# Patient Record
Sex: Female | Born: 1955
Health system: Southern US, Community
[De-identification: ages and names within clinical notes are randomized; demographics above are authoritative.]

## PROBLEM LIST (undated history)

## (undated) DIAGNOSIS — M797 Fibromyalgia: Secondary | ICD-10-CM

## (undated) DIAGNOSIS — E785 Hyperlipidemia, unspecified: Secondary | ICD-10-CM

## (undated) DIAGNOSIS — E119 Type 2 diabetes mellitus without complications: Secondary | ICD-10-CM

## (undated) DIAGNOSIS — R238 Other skin changes: Secondary | ICD-10-CM

## (undated) DIAGNOSIS — I639 Cerebral infarction, unspecified: Secondary | ICD-10-CM

## (undated) DIAGNOSIS — K5792 Diverticulitis of intestine, part unspecified, without perforation or abscess without bleeding: Secondary | ICD-10-CM

## (undated) DIAGNOSIS — I10 Essential (primary) hypertension: Secondary | ICD-10-CM

## (undated) HISTORY — DX: Cerebral infarction, unspecified: I63.9

## (undated) HISTORY — PX: CHOLECYSTECTOMY: SHX55

## (undated) HISTORY — DX: Type 2 diabetes mellitus without complications: E11.9

## (undated) HISTORY — DX: Hyperlipidemia, unspecified: E78.5

## (undated) HISTORY — DX: Fibromyalgia: M79.7

## (undated) HISTORY — DX: Essential (primary) hypertension: I10

## (undated) HISTORY — DX: Other skin changes: R23.8

## (undated) HISTORY — PX: KNEE SURGERY: SHX244

## (undated) NOTE — *Deleted (*Deleted)
History and Physical    [name] WUJ:[id number] DOB: 12-Jul-1955 DOA: 03/01/2020  PCP: Benita Stabile, MD (Confirm with patient/family/NH records and if not entered, this has to be entered at [facility] point of entry) Patient coming from: ***  I have personally briefly reviewed patient's old medical records in [facility]  Chief Complaint: ***  HPI: [name] is a 44 y.o. female with medical history significant of *** (For level 3, the HPI must include 4+ descriptors: Location, Quality, Severity, Duration, Timing, Context, modifying factors, associated signs/symptoms and/or status of 3+ chronic problems.)  (Please avoid self-populating past medical history here) (The initial 2-3 lines should be focused and good to copy and paste in the HPI section of the daily progress note).  ED Course: ***  Review of Systems: As per HPI otherwise all other systems reviewed and are negative. Unacceptable ROS statements: "10 systems reviewed," "Extensive" (without elaboration).  Acceptable ROS statements: "All others negative," "All others reviewed and are negative," and "All others unremarkable," with at LEAST ONE ROS documented Can't double dip - if using for HPI can't use for ROS  Past Medical History:  Diagnosis Date  . Diabetes mellitus without complication (HCC)   . Diverticulitis   . Fibromyalgia   . Hyperlipidemia   . Hypertension   . Papule 03/22/2015  . Stroke [facility])     Past Surgical History:  Procedure Laterality Date  . CHOLECYSTECTOMY    . KNEE SURGERY Left     Social History  reports that she quit smoking about 18 years ago. Her smoking use included cigarettes. She has a 57.00 pack-year smoking history. She has never used smokeless tobacco. She reports current alcohol use of about 1.0 standard drink of alcohol per week. She reports that she does not use drugs.  Allergies  Allergen Reactions  . Bee Pollen Anaphylaxis and Swelling  . Bee Venom   . Codeine      REACTION: nauseated,claustrophobic    Family History  Problem Relation Age of Onset  . Diabetes Mother   . Dementia Mother   . Hypertension Mother   . Stroke Mother   . Alcohol abuse Father   . Cirrhosis Father   . Arthritis/Rheumatoid Sister   . Diabetes Brother   . Hypertension Brother   . Diabetes Maternal Grandmother   . Fibromyalgia Sister   . Hypertension Sister   . Fibromyalgia Sister   . Hypertension Sister   . Diabetes Sister   . Hypertension Sister   . Cirrhosis Brother   . Pancreatic cancer Brother    *** Unacceptable: Noncontributory, unremarkable, or negative. Acceptable: (example)Family history negative for heart disease  Prior to Admission medications   Medication Sig Start Date End Date Taking? Authorizing Provider  amLODipine (NORVASC) 10 MG tablet Take 10 mg by mouth at bedtime. 03/01/18  Yes [provider]  aspirin 325 MG tablet Take 1 tablet (325 mg total) by mouth daily. 11/28/17  Yes Vassie Loll, MD  atorvastatin (LIPITOR) 40 MG tablet Take 40 mg by mouth daily. 05/05/18  Yes [provider]  carvedilol (COREG) 6.25 MG tablet Take 6.25 mg by mouth 2 (two) times daily. 01/12/20  Yes [provider]  cloNIDine (CATAPRES) 0.1 MG tablet Take 0.1 mg by mouth 2 (two) times daily. 04/25/18  Yes [provider]  diclofenac sodium (VOLTAREN) 1 % GEL Apply 3 g to 3 large joints up to 3 times a day when necessary 02/27/18  Yes Gearldine Bienenstock, PA-C  fenofibrate 160 MG tablet Take 160 mg by mouth daily.  11/06/14  Yes [provider]  hydrALAZINE (APRESOLINE) 10 MG tablet Take 10 mg by mouth 2 (two) times daily. 04/05/18  Yes [provider]  ketoconazole (NIZORAL) 2 % cream Apply 1 application topically 2 (two) times daily. 02/05/20  Yes [provider]  lidocaine (LIDODERM) 5 % Place 1 patch onto the skin daily as needed. Apply patch to area most significant pain once per day.  Remove and discard patch within  12 hours of application. 11/14/19  Yes Petrucelli, Samantha R, PA-C  metFORMIN (GLUCOPHAGE) 1000 MG tablet Take 1 tablet (1,000 mg total) by mouth 2 (two) times daily with a meal. 11/27/17 03/01/20 Yes Vassie Loll, MD  olmesartan (BENICAR) 40 MG tablet Take 40 mg by mouth daily. 04/27/18  Yes [provider]  omega-3 acid ethyl esters (LOVAZA) 1 g capsule Take 1 capsule (1 g total) by mouth 2 (two) times daily. 11/27/17  Yes Vassie Loll, MD  simvastatin (ZOCOR) 40 MG tablet Take 40 mg by mouth daily at 6 PM.  01/30/18  Yes [provider]  SOLIQUA 100-33 UNT-MCG/ML SOPN Inject 16 Units into the skin daily before breakfast. (Use less than 1 hour before first meal of the day) 03/22/18  Yes [provider]  albuterol (PROVENTIL HFA;VENTOLIN HFA) 108 (90 Base) MCG/ACT inhaler Inhale 2 puffs into the lungs every 6 (six) hours as needed.  04/18/18   [provider]  baclofen 5 MG TABS Take 5 mg by mouth 2 (two) times daily. Start 1 tablet twice daily x 1 week then three times daily Patient not taking: Reported on 03/01/2020 05/27/18   Micki Riley, MD  Blood Glucose Monitoring Suppl (ONETOUCH VERIO) w/Device KIT USE TO CHECK GLUCOSE THREE TIMES DAILY 11/22/17   [provider]  hydrochlorothiazide (HYDRODIURIL) 12.5 MG tablet Take 12.5 mg by mouth daily. 02/16/20   [provider]  insulin detemir (LEVEMIR) 100 UNIT/ML injection Inject 0.3 mLs (30 Units total) into the skin daily. Patient not taking: Reported on 03/01/2020 11/28/17   Vassie Loll, MD  losartan (COZAAR) 100 MG tablet Take 100 mg by mouth daily. 01/16/18   [provider]  ONETOUCH VERIO test strip USE 1 STRIP TO CHECK GLUCOSE THREE TIMES DAILY 11/23/17   [provider]    Physical Exam: Vitals:   03/01/20 2000 03/01/20 2119 03/01/20 2130 03/01/20 2300  BP: (!) 177/73 (!) 163/85 (!) 164/92 (!) 160/72  Pulse: (!) 102 (!) 103 (!) 102 100  Resp: 20 19 20 18   Temp:       TempSrc:      SpO2: 92% 92% 93% 95%  Weight:      Height:        Constitutional: NAD, calm, comfortable Vitals:   03/01/20 2000 03/01/20 2119 03/01/20 2130 03/01/20 2300  BP: (!) 177/73 (!) 163/85 (!) 164/92 (!) 160/72  Pulse: (!) 102 (!) 103 (!) 102 100  Resp: 20 19 20 18   Temp:      TempSrc:      SpO2: 92% 92% 93% 95%  Weight:      Height:       Eyes: PERRL, lids and conjunctivae normal ENMT: Mucous membranes are moist. Posterior pharynx clear of any exudate or lesions.Normal dentition.  Neck: normal, supple, no masses, no thyromegaly Respiratory: clear to auscultation bilaterally, no wheezing, no crackles. Normal respiratory effort. No accessory muscle use.  Cardiovascular: Regular rate and rhythm, no murmurs /  rubs / gallops. No extremity edema. 2+ pedal pulses. No carotid bruits.  Abdomen: no tenderness, no masses palpated. No hepatosplenomegaly. Bowel sounds positive.  Musculoskeletal: no clubbing / cyanosis. No joint deformity upper and lower extremities. Good ROM, no contractures. Normal muscle tone.  Skin: no rashes, lesions, ulcers. No induration Neurologic: CN 2-12 grossly intact. Sensation intact, DTR normal. Strength 5/5 in all 4.  Psychiatric: Normal judgment and insight. Alert and oriented x 3. Normal mood.   (Anything < 9 systems with 2 bullets each down codes to level 1) (If patient refuses exam can't bill higher level) (Make sure to document decubitus ulcers present on admission -- if possible -- and whether patient has chronic indwelling catheter at time of admission)  Labs on Admission: I have personally reviewed following labs and imaging studies  CBC: Recent Labs  Lab 03/01/20 1808  WBC 5.5  NEUTROABS 3.4  HGB 12.0  HCT 36.9  MCV 92.3  PLT 181    Basic Metabolic Panel: Recent Labs  Lab 03/01/20 1808  NA 130*  K 3.8  CL 97*  CO2 23  GLUCOSE 230*  BUN 36*  CREATININE 1.47*  CALCIUM 8.6*    GFR: Estimated Creatinine Clearance: 35  mL/min (A) (by C-G formula based on SCr of 1.47 mg/dL (H)).  Liver Function Tests: Recent Labs  Lab 03/01/20 1808  AST 36  ALT 29  ALKPHOS 57  BILITOT 0.3  PROT 7.0  ALBUMIN 3.2*    Urine analysis:    Component Value Date/Time   COLORURINE YELLOW 11/25/2017 0747   APPEARANCEUR CLEAR 11/25/2017 0747   LABSPEC 1.020 11/25/2017 0747   PHURINE 6.0 11/25/2017 0747   GLUCOSEU >=500 (A) 11/25/2017 0747   HGBUR NEGATIVE 11/25/2017 0747   BILIRUBINUR NEGATIVE 11/25/2017 0747   KETONESUR 5 (A) 11/25/2017 0747   PROTEINUR >=300 (A) 11/25/2017 0747   UROBILINOGEN 0.2 11/08/2014 1915   NITRITE NEGATIVE 11/25/2017 0747   LEUKOCYTESUR NEGATIVE 11/25/2017 0747    Radiological Exams on Admission: CT HEAD WO CONTRAST  Result Date: 03/01/2020 CLINICAL DATA:  Neuro deficit, acute stroke suspected. Left leg weakness at X 2 days. History of previous stroke with right-sided weakness. Per family, smile looks different. EXAM: CT HEAD WITHOUT CONTRAST TECHNIQUE: Contiguous axial images were obtained from the base of the skull through the vertex without intravenous contrast. COMPARISON:  MR head 11/26/2017. CT head 11/25/2017 FINDINGS: Brain: Patchy and confluent areas of decreased attenuation are noted throughout the deep and periventricular white matter of the cerebral hemispheres bilaterally, compatible with chronic microvascular ischemic disease. Suggestion of bilateral chronic basal ganglia lacunar infarctions. Slightly more conspicuous/possibly slightly larger in size right basal ganglia hypodensity. No evidence of large-territorial acute infarction. No parenchymal hemorrhage. No mass lesion. No extra-axial collection. No mass effect or midline shift. No hydrocephalus. Basilar cisterns are patent. Vascular: No hyperdense vessel. Atherosclerotic calcifications are present within the cavernous internal carotid arteries. Skull: No acute fracture or focal lesion. Sinuses/Orbits: Paranasal sinuses and  mastoid air cells are clear. The orbits are unremarkable. Other: None. IMPRESSION: 1. Microvascular ischemic changes as well as bilateral chronic basal ganglia lacunar infarcts with possibly slightly larger right basal ganglia hypodensity. Cannot exclude overlying acute/subacute infarction. Consider noncontrast MRI for further/more sensitive evaluation. 2. No acute intracranial hemorrhage. These results were called by telephone at the time of interpretation on 03/01/2020 at 6:41 pm to provider [facility] , who verbally acknowledged these results. Electronically Signed   By: Tish Frederickson M.D.   On: 03/01/2020 18:39  EKG: Independently reviewed.    Assessment/Plan Principal Problem:   Weakness of left lower extremity Active Problems:   Hypertension   Type 2 diabetes mellitus with hyperglycemia (HCC)   HLD (hyperlipidemia)   Depression   Hypothyroidism   Right spastic hemiplegia (HCC)   Azotemia   Hyponatremia   DVT prophylaxis: *** (Lovenox/Heparin/SCD's/anticoagulated/None (if comfort care) Code Status:   *** (Full/Partial (specify details) Family Communication:  *** (Specify name, relationship. Do not write "discussed with patient". Specify tel # if discussed over the phone) Disposition Plan:   Patient is from:  ***  Anticipated DC to:  ***  Anticipated DC date:  ***  Anticipated DC barriers: ***  Consults called:  *** (with names) Admission status:  *** (inpatient / obs / tele / medical floor / SDU)  Severity of Illness: {Observation/Inpatient:21159}    Bobette Mo MD [facility]  How to contact the [facility] Attending or Consulting provider 7A - 7P or covering provider during after hours 7P -7A, for this patient?   1. Check the care team in [facility] and look for a) attending/consulting [facility] provider listed and b) the [facility] team listed 2. Log into [email or web address] and use [facility]'s universal password to access. If you do not have the password, please contact the  hospital operator. 3. Locate the [facility] provider you are looking for under [facility] and page to a number that you can be directly reached. 4. If you still have difficulty reaching the provider, please page the [facility] (Director on Call) for the Hospitalists listed on amion for assistance.  03/01/2020, 11:41 PM

---

## 2001-01-06 ENCOUNTER — Emergency Department (HOSPITAL_COMMUNITY): Admission: EM | Admit: 2001-01-06 | Discharge: 2001-01-06 | Payer: Self-pay | Admitting: Emergency Medicine

## 2001-01-08 ENCOUNTER — Ambulatory Visit (HOSPITAL_COMMUNITY): Admission: RE | Admit: 2001-01-08 | Discharge: 2001-01-08 | Payer: Self-pay | Admitting: Family Medicine

## 2001-01-08 ENCOUNTER — Encounter: Payer: Self-pay | Admitting: Family Medicine

## 2001-05-10 ENCOUNTER — Ambulatory Visit (HOSPITAL_COMMUNITY): Admission: RE | Admit: 2001-05-10 | Discharge: 2001-05-10 | Payer: Self-pay | Admitting: Family Medicine

## 2001-05-10 ENCOUNTER — Encounter: Payer: Self-pay | Admitting: Family Medicine

## 2001-11-05 ENCOUNTER — Ambulatory Visit (HOSPITAL_COMMUNITY): Admission: RE | Admit: 2001-11-05 | Discharge: 2001-11-05 | Payer: Self-pay | Admitting: Obstetrics and Gynecology

## 2001-11-05 ENCOUNTER — Encounter: Payer: Self-pay | Admitting: Obstetrics and Gynecology

## 2001-11-20 ENCOUNTER — Ambulatory Visit (HOSPITAL_COMMUNITY): Admission: RE | Admit: 2001-11-20 | Discharge: 2001-11-20 | Payer: Self-pay | Admitting: Family Medicine

## 2001-11-20 ENCOUNTER — Encounter: Payer: Self-pay | Admitting: Family Medicine

## 2001-11-21 ENCOUNTER — Encounter: Payer: Self-pay | Admitting: Family Medicine

## 2001-11-21 ENCOUNTER — Inpatient Hospital Stay (HOSPITAL_COMMUNITY): Admission: AD | Admit: 2001-11-21 | Discharge: 2001-11-25 | Payer: Self-pay | Admitting: Family Medicine

## 2001-11-22 ENCOUNTER — Encounter: Payer: Self-pay | Admitting: Family Medicine

## 2001-11-23 ENCOUNTER — Encounter: Payer: Self-pay | Admitting: General Surgery

## 2001-12-11 ENCOUNTER — Ambulatory Visit (HOSPITAL_COMMUNITY): Admission: RE | Admit: 2001-12-11 | Discharge: 2001-12-11 | Payer: Self-pay | Admitting: Obstetrics and Gynecology

## 2001-12-11 ENCOUNTER — Encounter: Payer: Self-pay | Admitting: Obstetrics and Gynecology

## 2002-07-15 ENCOUNTER — Ambulatory Visit (HOSPITAL_COMMUNITY): Admission: RE | Admit: 2002-07-15 | Discharge: 2002-07-15 | Payer: Self-pay | Admitting: Obstetrics and Gynecology

## 2002-07-15 ENCOUNTER — Encounter: Payer: Self-pay | Admitting: Obstetrics and Gynecology

## 2003-12-07 ENCOUNTER — Other Ambulatory Visit: Admission: RE | Admit: 2003-12-07 | Discharge: 2003-12-07 | Payer: Self-pay | Admitting: Family Medicine

## 2004-08-09 ENCOUNTER — Ambulatory Visit (HOSPITAL_COMMUNITY): Admission: RE | Admit: 2004-08-09 | Discharge: 2004-08-09 | Payer: Self-pay | Admitting: Obstetrics and Gynecology

## 2004-12-21 ENCOUNTER — Encounter (HOSPITAL_COMMUNITY): Admission: RE | Admit: 2004-12-21 | Discharge: 2005-01-10 | Payer: Self-pay | Admitting: Podiatry

## 2005-06-14 ENCOUNTER — Ambulatory Visit (HOSPITAL_BASED_OUTPATIENT_CLINIC_OR_DEPARTMENT_OTHER): Admission: RE | Admit: 2005-06-14 | Discharge: 2005-06-14 | Payer: Self-pay | Admitting: Orthopedic Surgery

## 2005-08-14 ENCOUNTER — Ambulatory Visit (HOSPITAL_COMMUNITY): Admission: RE | Admit: 2005-08-14 | Discharge: 2005-08-14 | Payer: Self-pay | Admitting: Family Medicine

## 2006-05-14 ENCOUNTER — Ambulatory Visit (HOSPITAL_COMMUNITY): Admission: RE | Admit: 2006-05-14 | Discharge: 2006-05-14 | Payer: Self-pay | Admitting: Family Medicine

## 2007-06-11 ENCOUNTER — Emergency Department (HOSPITAL_COMMUNITY): Admission: EM | Admit: 2007-06-11 | Discharge: 2007-06-11 | Payer: Self-pay | Admitting: Emergency Medicine

## 2007-06-14 ENCOUNTER — Ambulatory Visit (HOSPITAL_COMMUNITY): Admission: RE | Admit: 2007-06-14 | Discharge: 2007-06-14 | Payer: Self-pay | Admitting: Internal Medicine

## 2007-08-15 ENCOUNTER — Other Ambulatory Visit: Admission: RE | Admit: 2007-08-15 | Discharge: 2007-08-15 | Payer: Self-pay | Admitting: Obstetrics and Gynecology

## 2007-08-19 ENCOUNTER — Ambulatory Visit (HOSPITAL_COMMUNITY): Admission: RE | Admit: 2007-08-19 | Discharge: 2007-08-19 | Payer: Self-pay | Admitting: Obstetrics & Gynecology

## 2007-09-14 ENCOUNTER — Emergency Department (HOSPITAL_COMMUNITY): Admission: EM | Admit: 2007-09-14 | Discharge: 2007-09-14 | Payer: Self-pay | Admitting: Emergency Medicine

## 2007-10-03 ENCOUNTER — Ambulatory Visit (HOSPITAL_COMMUNITY): Admission: RE | Admit: 2007-10-03 | Discharge: 2007-10-03 | Payer: Self-pay | Admitting: Family Medicine

## 2008-09-14 ENCOUNTER — Ambulatory Visit (HOSPITAL_COMMUNITY): Admission: RE | Admit: 2008-09-14 | Discharge: 2008-09-14 | Payer: Self-pay | Admitting: Family Medicine

## 2008-10-21 ENCOUNTER — Other Ambulatory Visit: Admission: RE | Admit: 2008-10-21 | Discharge: 2008-10-21 | Payer: Self-pay | Admitting: Obstetrics and Gynecology

## 2009-06-21 ENCOUNTER — Encounter (INDEPENDENT_AMBULATORY_CARE_PROVIDER_SITE_OTHER): Payer: Self-pay

## 2009-06-22 ENCOUNTER — Encounter (INDEPENDENT_AMBULATORY_CARE_PROVIDER_SITE_OTHER): Payer: Self-pay

## 2009-06-22 ENCOUNTER — Ambulatory Visit: Payer: Self-pay | Admitting: Internal Medicine

## 2009-07-06 ENCOUNTER — Ambulatory Visit: Payer: Self-pay | Admitting: Internal Medicine

## 2009-07-13 ENCOUNTER — Encounter: Payer: Self-pay | Admitting: Internal Medicine

## 2009-12-31 ENCOUNTER — Observation Stay (HOSPITAL_COMMUNITY): Admission: EM | Admit: 2009-12-31 | Discharge: 2010-01-01 | Payer: Self-pay | Admitting: Cardiology

## 2009-12-31 ENCOUNTER — Encounter: Payer: Self-pay | Admitting: Emergency Medicine

## 2010-06-02 NOTE — Letter (Signed)
Summary: Patient Notice- Polyp Results  [facility]  [address] [place], [place] [address]   Phone: [phone]  Fax: [phone]        July 09, 2009 MRN: [id number]    [facility] [address] [place], [place]  [address]    Dear Ms. Clingan,  I am pleased to inform you that the colon polyp(s) removed during your recent colonoscopy was (were) found to be benign (no cancer detected) upon pathologic examination. Your polyp was hyperplastic ( not precancerous)  I recommend you have a repeat colonoscopy examination in 10_ years to look for recurrent polyps, as having colon polyps increases your risk for having recurrent polyps or even colon cancer in the future.  Should you develop new or worsening symptoms of abdominal pain, bowel habit changes or bleeding from the rectum or bowels, please schedule an evaluation with either your primary care physician or with me.  Additional information/recommendations:  _x_ No further action with gastroenterology is needed at this time. Please      follow-up with your primary care physician for your other healthcare      needs.  __ Please call [phone] to schedule a return visit to review your      situation.  __ Please keep your follow-up visit as already scheduled.  __ Continue treatment plan as outlined the day of your exam.  Please call us if you are having persistent problems or have questions about your condition that have not been fully answered at this time.  Sincerely,  Hart Carwin MD  This letter has been electronically signed by your physician.  Appended Document: Patient Notice- Polyp Results Letter mailed 3.15.11.

## 2010-06-02 NOTE — Miscellaneous (Signed)
Summary: Lec previsit  Clinical Lists Changes  Medications: Added new medication of DULCOLAX 5 MG  TBEC (BISACODYL) Day before procedure take 2 at 3pm and 2 at 8pm. - Signed Added new medication of REGLAN 10 MG  TABS (METOCLOPRAMIDE HCL) As per prep instructions. - Signed Added new medication of MIRALAX   POWD (POLYETHYLENE GLYCOL 3350) As per prep  instructions. - Signed Rx of DULCOLAX 5 MG  TBEC (BISACODYL) Day before procedure take 2 at 3pm and 2 at 8pm.;  #4 x 0;  Signed;  Entered by: Ulis Rias RN;  Authorized by: Hart Carwin MD;  Method used: Electronically to [facility] [address] [place], [place], [place]  [address], Ph: [phone], Fax: [phone] Rx of REGLAN 10 MG  TABS (METOCLOPRAMIDE HCL) As per prep instructions.;  #2 x 0;  Signed;  Entered by: Ulis Rias RN;  Authorized by: Hart Carwin MD;  Method used: Electronically to [facility] [address], [place], [place], [place]  [address], Ph: [phone], Fax: [phone] Rx of MIRALAX   POWD (POLYETHYLENE GLYCOL 3350) As per prep  instructions.;  #255gm x 0;  Signed;  Entered by: Ulis Rias RN;  Authorized by: Hart Carwin MD;  Method used: Electronically to [facility] [address] [place], [place], [place]  [address], Ph: [phone], Fax: [phone] Allergies: Added new allergy or adverse reaction of CODEINE Observations: Added new observation of NKA: F (06/22/2009 10:17)    Prescriptions: MIRALAX   POWD (POLYETHYLENE GLYCOL 3350) As per prep  instructions.  #255gm x 0   Entered by:   Ulis Rias RN   Authorized by:   Hart Carwin MD   Signed by:   Ulis Rias RN on 06/22/2009   Method used:   Electronically to        [facility] (retail)       [address]       [place], [place]  [address]       Ph: [phone]       Fax: [phone]   RxID:   [id number] REGLAN 10 MG  TABS  (METOCLOPRAMIDE HCL) As per prep instructions.  #2 x 0   Entered by:   Ulis Rias RN   Authorized by:   Hart Carwin MD   Signed by:   Ulis Rias RN on 06/22/2009   Method used:   Electronically to        [facility] (retail)       [address]       [place], [place]  [address]       Ph: [phone]       Fax: [phone]   RxID:   [id number] DULCOLAX 5 MG  TBEC (BISACODYL) Day before procedure take 2 at 3pm and 2 at 8pm.  #4 x 0   Entered by:   Ulis Rias RN   Authorized by:   Hart Carwin MD   Signed by:   Ulis Rias RN on 06/22/2009   Method used:   Electronically to        [facility] (retail)       [address]       [place], [place]  [address]       Ph: [phone]       Fax: [phone]   RxID:  [id number]  

## 2010-06-02 NOTE — Letter (Signed)
Summary: Diabetic Instructions  [facility]  [address] [place], [place] [address]   Phone: [phone]  Fax: [phone]    [name] 11/01/1955 MRN: [id number]   _  _   ORAL DIABETIC MEDICATION INSTRUCTIONS  The day before your procedure:   Take your diabetic pill as you do normally  The day of your procedure:   Do not take your diabetic pill    We will check your blood sugar levels during the admission process and again in Recovery before discharging you home  ________________________________________________________________________  _  _   INSULIN (LONG ACTING) MEDICATION INSTRUCTIONS (Lantus, NPH, 70/30, Humulin, Novolin-N)   The day before your procedure:   Take  your regular evening dose    The day of your procedure:   Do not take your morning dose    _  _   INSULIN (SHORT ACTING) MEDICATION INSTRUCTIONS (Regular, Humulog, Novolog)   The day before your procedure:   Do not take your evening dose   The day of your procedure:   Do not take your morning dose   _  _   INSULIN PUMP MEDICATION INSTRUCTIONS  We will contact the physician managing your diabetic care for written dosage instructions for the day before your procedure and the day of your procedure.  Once we have received the instructions, we will contact you.

## 2010-06-02 NOTE — Letter (Signed)
Summary: [facility] Instructions  [facility]  [address] [place], [place] [address]   Phone: [phone]  Fax: [phone]       [name]    1955-06-28    MRN: [id number]       Procedure Day /Date: Tuesday 07/06/2009     Arrival Time:  8:00 am     Procedure Time: 9:00 am     Location of Procedure:                    _x _  [facility] (4th Floor)    PREPARATION FOR COLONOSCOPY WITH MIRALAX  Starting 5 days prior to your procedure Thursday 3/3  do not eat nuts, seeds, popcorn, corn, beans, peas,  salads, or any raw vegetables.  Do not take any fiber supplements (e.g. Metamucil, Citrucel, and Benefiber). ____________________________________________________________________________________________________   THE DAY BEFORE YOUR PROCEDURE         DATE: Monday 3/7 1   Drink clear liquids the entire day-NO SOLID FOOD  2   Do not drink anything colored red or purple.  Avoid juices with pulp.  No orange juice.  3   Drink at least 64 oz. (8 glasses) of fluid/clear liquids during the day to prevent dehydration and help the prep work efficiently.  CLEAR LIQUIDS INCLUDE: Water Jello Ice Popsicles Tea (sugar ok, no milk/cream) Powdered fruit flavored drinks Coffee (sugar ok, no milk/cream) Gatorade Juice: apple, white grape, white cranberry  Lemonade Clear bullion, consomm, broth Carbonated beverages (any kind) Strained chicken noodle soup Hard Candy  4   Mix the entire bottle of Miralax with 64 oz. of Gatorade/Powerade in the morning and put in the refrigerator to chill.  5   At 3:00 pm take 2 Dulcolax/Bisacodyl tablets.  6   At 4:30 pm take one Reglan/Metoclopramide tablet.  7  Starting at 5:00 pm drink one 8 oz glass of the Miralax mixture every 15-20 minutes until you have finished drinking the entire 64 oz.  You should finish drinking prep around 7:30 or 8:00 pm.  8   If you are nauseated, you may take the 2nd Reglan/Metoclopramide  tablet at 6:30 pm.        9    At 8:00 pm take 2 more DULCOLAX/Bisacodyl tablets.     THE DAY OF YOUR PROCEDURE      DATE: Tuesday 3/8  You may drink clear liquids until 7:00 am  (2 HOURS BEFORE PROCEDURE).   MEDICATION INSTRUCTIONS  Unless otherwise instructed, you should take regular prescription medications with a small sip of water as early as possible the morning of your procedure.  Diabetic patients - see separate instructions.   Additional medication instructions: Do not take fluid pill am of procedure.         OTHER INSTRUCTIONS  You will need a responsible adult at least 56 years of age to accompany you and drive you home.   This person must remain in the waiting room during your procedure.  Wear loose fitting clothing that is easily removed.  Leave jewelry and other valuables at home.  However, you may wish to bring a book to read or an iPod/MP3 player to listen to music as you wait for your procedure to start.  Remove all body piercing jewelry and leave at home.  Total time from sign-in until discharge is approximately 2-3 hours.  You should go home directly after your procedure and rest.  You can resume normal activities the  day after your procedure.  The day of your procedure you should not:   Drive   Make legal decisions   Operate machinery   Drink alcohol   Return to work  You will receive specific instructions about eating, activities and medications before you leave.   The above instructions have been reviewed and explained to me by   Ulis Rias RN  June 22, 2009 10:42 AM     I fully understand and can verbalize these instructions _____________________________ Date _______

## 2010-06-02 NOTE — Procedures (Signed)
Summary: Colonoscopy  Patient: [name] Note: All result statuses are Final unless otherwise noted.  Tests: (1) Colonoscopy (COL)   COL Colonoscopy           DONE     [facility]     [address]     [place], [place]  [address]           COLONOSCOPY PROCEDURE REPORT           PATIENT:  Michele, Schaefer  MR#:  [id number]     BIRTHDATE:  Aug 21, 1955, 53 yrs. old  GENDER:  female           ENDOSCOPIST:  Hedwig Morton. Juanda Chance, MD     Referred by:  Assunta Found, M.D.           PROCEDURE DATE:  07/06/2009     PROCEDURE:  Colonoscopy with submucosal injection, Colonoscopy     [id number]     ASA CLASS:  Class I     INDICATIONS:  Routine Risk Screening           MEDICATIONS:   Versed 9 mg, Fentanyl 75 mcg           DESCRIPTION OF PROCEDURE:   After the risks benefits and     alternatives of the procedure were thoroughly explained, informed     consent was obtained.  Digital rectal exam was performed and     revealed no rectal masses.   The LB CF-[id number] endoscope     was introduced through the anus and advanced to the cecum, which     was identified by both the appendix and ileocecal valve, without     limitations.  The quality of the prep was good, using MiraLax.     The instrument was then slowly withdrawn as the colon was fully     examined.     <<PROCEDUREIMAGES>>           FINDINGS:  A diminutive polyp was found in the sigmoid colon. 3 mm     diminutive polyp at 30 cm The polyp was removed using cold biopsy     forceps (see image1).  Mild diverticulosis was found in the     sigmoid colon (see image5). 1 shallow diverticulum identified     This was otherwise a normal examination of the colon (see image6,     image4, image3, and image2).   Retroflexion was not performed.     The scope was then withdrawn from the patient and the procedure     completed.           COMPLICATIONS:  None           ENDOSCOPIC IMPRESSION:     1) Diminutive polyp in the sigmoid  colon     2) Mild diverticulosis in the sigmoid colon     3) Otherwise normal examination     RECOMMENDATIONS:     1) Await pathology results           REPEAT EXAM:  In 10 year(s) for.           ______________________________     Hedwig Morton. Juanda Chance, MD           CC:           n.     eSIGNED:   Hedwig Morton. Wendy Hoback at 07/06/2009 09:33 AM           [place], Michele, [id number]  Note: An exclamation mark Marland Kitchen)  indicates a result that was not dispersed into the flowsheet. Document Creation Date: 07/06/2009 9:33 AM _______________________________________________________________________  (1) Order result status: Final Collection or observation date-time: 07/06/2009 09:27 Requested date-time:  Receipt date-time:  Reported date-time:  Referring Physician:   Ordering Physician: Lina Sar [phone]) Specimen Source:  Source: Launa Grill Order Number: [id number] Lab site:   Appended Document: Colonoscopy     Procedures Next Due Date:    Colonoscopy: 06/2019

## 2010-07-14 LAB — CBC
HCT: 40.1 % (ref 36.0–46.0)
Hemoglobin: 13.3 g/dL (ref 12.0–15.0)
MCH: 31.8 pg (ref 26.0–34.0)
MCHC: 33.2 g/dL (ref 30.0–36.0)
MCV: 96 fL (ref 78.0–100.0)
RBC: 4.18 MIL/uL (ref 3.87–5.11)
WBC: 4.8 10*3/uL (ref 4.0–10.5)

## 2010-07-14 LAB — DIFFERENTIAL
Basophils Absolute: 0 10*3/uL (ref 0.0–0.1)
Basophils Relative: 0 % (ref 0–1)
Lymphocytes Relative: 45 % (ref 12–46)
Lymphs Abs: 2.2 10*3/uL (ref 0.7–4.0)
Neutro Abs: 2.2 10*3/uL (ref 1.7–7.7)
Neutrophils Relative %: 45 % (ref 43–77)

## 2010-07-14 LAB — APTT: aPTT: 26 seconds (ref 24–37)

## 2010-07-14 LAB — COMPREHENSIVE METABOLIC PANEL
ALT: 26 U/L (ref 0–35)
AST: 22 U/L (ref 0–37)
Alkaline Phosphatase: 53 U/L (ref 39–117)
Glucose, Bld: 84 mg/dL (ref 70–99)
Sodium: 137 mEq/L (ref 135–145)
Total Bilirubin: 0.5 mg/dL (ref 0.3–1.2)

## 2010-07-14 LAB — CK TOTAL AND CKMB (NOT AT ARMC)
CK, MB: 1.4 ng/mL (ref 0.3–4.0)
Relative Index: INVALID (ref 0.0–2.5)
Total CK: 56 U/L (ref 7–177)

## 2010-07-14 LAB — GLUCOSE, CAPILLARY
Glucose-Capillary: 88 mg/dL (ref 70–99)
Glucose-Capillary: 88 mg/dL (ref 70–99)
Glucose-Capillary: 89 mg/dL (ref 70–99)

## 2010-07-14 LAB — LIPID PANEL
LDL Cholesterol: 115 mg/dL — ABNORMAL HIGH (ref 0–99)
Total CHOL/HDL Ratio: 5.3 RATIO
Triglycerides: 270 mg/dL — ABNORMAL HIGH (ref <150)

## 2010-07-14 LAB — CARDIAC PANEL(CRET KIN+CKTOT+MB+TROPI)
Relative Index: INVALID (ref 0.0–2.5)
Total CK: 67 U/L (ref 7–177)

## 2010-07-25 LAB — GLUCOSE, CAPILLARY
Glucose-Capillary: 87 mg/dL (ref 70–99)
Glucose-Capillary: 91 mg/dL (ref 70–99)

## 2010-09-16 NOTE — Op Note (Signed)
   NAME:  [name], [name]                      ACCOUNT NO.:  [id number]   MEDICAL RECORD NO.:  [id number]                   PATIENT TYPE:  INP   LOCATION:  A301                                 FACILITY:  [facility]   PHYSICIAN:  Dirk Dress. Katrinka Blazing, M.D.                DATE OF BIRTH:  09-26-1955   DATE OF PROCEDURE:  11/21/2001  DATE OF DISCHARGE:  11/25/2001                                 OPERATIVE REPORT   PREOPERATIVE DIAGNOSES:  Acute acalculous cholecystitis.   POSTOPERATIVE DIAGNOSES:  Acute acalculous cholecystitis with marked fatty  infiltration of the liver.   PROCEDURE:  Laparoscopic cholecystectomy.   SURGEON:  Dirk Dress. Katrinka Blazing, M.D.   DESCRIPTION OF PROCEDURE:  Under general anesthesia, the patient's abdomen  was prepped and draped in a sterile field.  A supraumbilical midline  incision was made and Veress needle was inserted uneventfully. The abdomen  was insufflated with 3 liters of CO2.  Using a Visiport guide, a 10 mm port  was placed uneventfully.  Laparoscope was placed without difficulty.  The  gallbladder was markedly distended, slightly thickened, with some acute and  chronic adhesions to the wall of the gallbladder.  The most striking feature  was that she had a very yellow-orange fatty infiltration of her liver.  This  involved the entire right lobe and left lobe of her liver.  Under  videoscopic guidance, a 10 mm port and two 5 mm ports were placed in the  right upper quadrant without difficulty.  The gallbladder was grasped and  positioned.  The cystic artery was isolated, clipped with multiple clips,  and divided.  The cystic duct was isolated, clipped with five clips, and  divided.  Using the hook electrode, the gallbladder was then separated from  the hepatic bed without difficulty.  It was grasped and retrieved intact.  Hemostasis was achieved.  There was no bleeding from the bed.  There was no  evidence of bile leak.  CO2 was allowed to escape from the abdomen  and the  ports were removed.  The incisions were closed using 0 Dexon on the fascia  of the larger incisions and staples on the skin.  The patient tolerated the  procedure well.  Dressings were placed.  She was awakened,  transferred to a  bed, and taken to the postanesthesia care unit for further monitoring.                                                Dirk Dress. Katrinka Blazing, M.D.    LCS/MEDQ  D:  11/23/2001  T:  11/28/2001  Job:  [id number]   cc:   Kirk Ruths, M.D.

## 2010-09-16 NOTE — Op Note (Signed)
[name], [name]            ACCOUNT NO.:  [id number]   MEDICAL RECORD NO.:  [id number]          PATIENT TYPE:  AMB   LOCATION:  DSC                          FACILITY:  [facility]   PHYSICIAN:  Loreta Ave, M.D. DATE OF BIRTH:  May 05, 1955   DATE OF PROCEDURE:  06/14/2005  DATE OF DISCHARGE:                                 OPERATIVE REPORT   PREOPERATIVE DIAGNOSIS:  Left knee medial meniscal tear with  tricompartmental chondromalacia, degenerative arthritis.   POSTOPERATIVE DIAGNOSIS:  Left knee medial meniscal tear with  tricompartmental chondromalacia, degenerative arthritis with diffuse grade 3  chondromalacia medial compartment, even some focal grade 3.  Deep grade 3  chondromalacia weightbearing dome of lateral femoral condyle with numerous  chondral loose bodies.   OPERATION PERFORMED:  Left knee examination under anesthesia, arthroscopy,  extensive partial medial meniscectomy.  Chondroplasty medial and lateral  compartments with removal of chondral loose bodies.   SURGEON:  Loreta Ave, M.D.   ASSISTANT:  Genene Churn. Denton Meek.   ANESTHESIA:  Knee block with sedation.   SPECIMENS:  None.   CULTURES:  None.   COMPLICATIONS:  None.   DRESSING:  Soft compressive.   DESCRIPTION OF PROCEDURE:  The patient was brought to the operating room and  after adequate anesthesia had been obtained, the left knee examined.  Good  motion, good stability and alignment.  Tourniquet and leg holder applied.  Leg prepped and draped in the usual sterile fashion.  Three portals were  created, one superolateral, one each medial and lateral parapatellar.  Inflow catheter introduced.  Knee distended.  Arthroscope introduced knee  inspected.  Good patellofemoral tracking.  Not much chondromalacia there.  Numerous chondral loose bodies throughout the knee emanating from the medial  and lateral compartments.  These were all removed.  Cruciate ligaments  intact.  Medially, markedly  macerated torn up complex tearing entire  anterior two thirds medial meniscus. Nothing salvageable.  This had a very  chronic appearance.  The entire anterior two thirds removed, tapered in to  remaining meniscus.  Salvage of posterior third.  Grade 3 changes  throughout.  Even some focal grade 4 on the medial border of the tibia.  All  loose fragments removed.  Attention turned laterally.  Plateau and meniscus  looked good.  A deep grade 3 lesion about half the weightbearing dome from  front to back. Lateral side, lateral femoral condyle.  Chondroplasty to a  stable surface.  Still some cartilage at the base, not exposed bone.  At  completion, all  recess examined, all loose fragments removed.  Instruments and fluid  removed.  Knee injected with Marcaine.  Portals were closed with 4-0 nylon.  Sterile compressive dressing applied.  Anesthesia reversed.  Brought to  recovery room.  Tolerated surgery well.  No complications.      Loreta Ave, M.D.  Electronically Signed     DFM/MEDQ  D:  06/14/2005  T:  06/14/2005  Job:  [id number]

## 2010-09-16 NOTE — H&P (Signed)
[facility]  Patient:    [name], [name] Visit Number: [id number] MRN: [id number]          Service Type: OUT Location: RAD Attending Physician:  Kirk Ruths Dictated by:   Karleen Hampshire, M.D. Admit Date:  11/20/2001 Discharge Date: 11/20/2001                           History and Physical  CHIEF COMPLAINT:  Vomiting.  HISTORY OF PRESENT ILLNESS:  The patient is a 55 year old white female who presented to the office 2 days before admission with a 3-day history of vomiting.  The patient complained of an acid taste in her mouth, some mild diarrhea and on exam at that time she was slightly tender in her right upper quadrant.  The patient was treated with IM antiemetics and tablets. Ultrasound of the gallbladder was obtained which was negative.  The patient to schedule for a biliary scan in the morning.  The patient has continued to vomit, still having some loose stools.  Denies fever, aches, chills.  She is admitted for control of her vomiting and further evaluation.  ALLERGIES:  CODEINE causes nausea.  MEDICATIONS:  The patient takes Zestoretic 12.5 for blood pressure.  She is on Lopid 600 mg b.i.d. and Prevacid.  SOCIAL HISTORY:  She is a smoker; denies alcohol or drug use.  The patient is a Public affairs consultant at [employer].  PHYSICAL EXAMINATION:  GENERAL:   Middle-aged white female who appears miserable and nauseated.  VITAL SIGNS:  Temperature is 98.8, pulse is 100 and regular, respirations 18, blood pressure is 122/80.  HEENT:  TMs normal.  Pupils equal to light and accommodation.  Oropharynx benign.  NECK:  Supple, without JVD, bruit, or thyromegaly.  LUNGS:  Clear in all areas.  HEART:  Regular sinus rhythm, without murmurs, gallops, or rubs.  ABDOMEN:  Positive but diminished bowel sounds.  There is some mild right upper quadrant tenderness.  No rebound.  EXTREMITIES:  Without clubbing, cyanosis, or  edema.  NEUROLOGIC:  Intact.  ASSESSMENT:  Protracted vomiting, possible gallbladder disease.  PLAN:  We will admit, IV fluids, antiemetics, and GI consult. Dictated by:   Karleen Hampshire, M.D. Attending Physician:  Kirk Ruths DD:  11/21/01 TD:  11/24/01 Job: [id number]

## 2010-09-16 NOTE — Discharge Summary (Signed)
NAME:  [name], [name]                      ACCOUNT NO.:  [id number]   MEDICAL RECORD NO.:  [id number]                   PATIENT TYPE:  INP   LOCATION:  A301                                 FACILITY:  [facility]   PHYSICIAN:  Dirk Dress. Katrinka Blazing, M.D.                DATE OF BIRTH:  December 18, 1955   DATE OF ADMISSION:  11/21/2001  DATE OF DISCHARGE:  11/25/2001                                 DISCHARGE SUMMARY   DISCHARGE DIAGNOSES:  1. Acute acalculous cholecystitis.  2. New-onset diabetes mellitus.  3. Hypertension.  4. Hyperlipidemia.  5. Chronic fatty infiltration of the liver.  6. Dysfunctional uterine bleeding.   PROCEDURE:  Laparoscopic cholecystectomy on November 23, 2001.   DISPOSITION:  The patient is discharged to home in stable, satisfactory  condition.   DISCHARGE MEDICATIONS:  1. Glucophage 500 mg b.i.d.  2. Tylox two every four hours p.r.n. pain.  3. Zestoretic 20/12.5 mg q.d.  4. Lopid 650 mg b.i.d.  5. Prevacid one q.d.   FOLLOW UP:  She will have followup in the office on August 4.  She will see  Dr. Regino Schultze on December 09, 2001.   HISTORY OF PRESENT ILLNESS:  This is a 55 year old female with history of  acute onset of abdominal pain, nausea, vomiting and diarrhea.  The pain  nausea, vomiting and diarrhea was persistent.  She was unable to keep down  food Sunday through Thursday.  She states that the pain was in the  epigastrium in the upper quadrant.  She had a constant, dull pain.  She has  had pain with bloating for a few months, but this was usually transient.  She was evaluated and it was noted that her sodium was 125, glucose 628, BUN  29, amylase and lipase normal.  Urinalysis was normal.  HIDA scan show  ejection fraction of 22%.  The patient had apparent new-onset of diabetes  mellitus along with her biliary tract disease.  She was treated medically by  Dr. Regino Schultze.  At the time she was seen, her blood sugar was much better  controlled.  She underwent  laparoscopically cholecystectomy on July 26.  At  the time of laparoscopy, she was found to have severe fatty infiltration of  the liver with almost yellow, fatty replaced liver.  She had no problems in the postoperative period and diabetes was better  controlled with oral agents.  By July 28, she did not have any nausea or  vomiting and she was febrile.  Her incisions looked good.  She was  discharged home with close followup of her diabetes by Dr. Regino Schultze and  follow up in the office as needed.  Dirk Dress. Katrinka Blazing, M.D.    LCS/MEDQ  D:  12/08/2001  T:  12/12/2001  Job:  [id number]

## 2010-11-11 ENCOUNTER — Other Ambulatory Visit: Payer: Self-pay | Admitting: Orthopedic Surgery

## 2010-11-11 ENCOUNTER — Encounter (HOSPITAL_COMMUNITY): Payer: BC Managed Care – PPO

## 2010-11-11 LAB — CBC
HCT: 39.8 % (ref 36.0–46.0)
Hemoglobin: 13.1 g/dL (ref 12.0–15.0)
RDW: 14.1 % (ref 11.5–15.5)
WBC: 7.1 10*3/uL (ref 4.0–10.5)

## 2010-11-11 LAB — COMPREHENSIVE METABOLIC PANEL
ALT: 28 U/L (ref 0–35)
AST: 26 U/L (ref 0–37)
Calcium: 9.9 mg/dL (ref 8.4–10.5)
Creatinine, Ser: 1.08 mg/dL (ref 0.50–1.10)
GFR calc Af Amer: >60 mL/min (ref >60)
GFR calc non Af Amer: 52 mL/min — ABNORMAL LOW (ref >60)
Glucose, Bld: 85 mg/dL (ref 70–99)
Sodium: 136 mEq/L (ref 135–145)
Total Protein: 7.4 g/dL (ref 6.0–8.3)

## 2010-11-11 LAB — DIFFERENTIAL
Lymphocytes Relative: 34 % (ref 12–46)
Lymphs Abs: 2.4 10*3/uL (ref 0.7–4.0)
Monocytes Absolute: 0.5 10*3/uL (ref 0.1–1.0)
Monocytes Relative: 7 % (ref 3–12)
Neutrophils Relative %: 58 % (ref 43–77)

## 2010-11-11 LAB — URINALYSIS, ROUTINE W REFLEX MICROSCOPIC
Glucose, UA: NEGATIVE mg/dL
Hgb urine dipstick: NEGATIVE
Protein, ur: 30 mg/dL — AB
pH: 5.5 (ref 5.0–8.0)

## 2010-11-11 LAB — APTT: aPTT: 28 seconds (ref 24–37)

## 2010-11-11 LAB — PROTIME-INR: Prothrombin Time: 12.6 seconds (ref 11.6–15.2)

## 2010-11-11 LAB — URINE MICROSCOPIC-ADD ON

## 2010-11-11 LAB — SURGICAL PCR SCREEN: Staphylococcus aureus: NEGATIVE

## 2010-11-24 ENCOUNTER — Inpatient Hospital Stay (HOSPITAL_COMMUNITY)
Admission: RE | Admit: 2010-11-24 | Discharge: 2010-11-26 | DRG: 209 | Disposition: A | Discharge status: Home Health | Payer: BC Managed Care – PPO | Source: Ambulatory Visit | Attending: Orthopedic Surgery | Admitting: Orthopedic Surgery

## 2010-11-24 DIAGNOSIS — Z01812 Encounter for preprocedural laboratory examination: Secondary | ICD-10-CM

## 2010-11-24 DIAGNOSIS — F411 Generalized anxiety disorder: Secondary | ICD-10-CM | POA: Diagnosis present

## 2010-11-24 DIAGNOSIS — E119 Type 2 diabetes mellitus without complications: Secondary | ICD-10-CM | POA: Diagnosis present

## 2010-11-24 DIAGNOSIS — M1710 Unilateral primary osteoarthritis, unspecified knee: Principal | ICD-10-CM | POA: Diagnosis present

## 2010-11-24 DIAGNOSIS — D62 Acute posthemorrhagic anemia: Secondary | ICD-10-CM | POA: Diagnosis not present

## 2010-11-24 DIAGNOSIS — Z96659 Presence of unspecified artificial knee joint: Secondary | ICD-10-CM

## 2010-11-24 DIAGNOSIS — E7800 Pure hypercholesterolemia, unspecified: Secondary | ICD-10-CM | POA: Diagnosis present

## 2010-11-24 DIAGNOSIS — I10 Essential (primary) hypertension: Secondary | ICD-10-CM | POA: Diagnosis present

## 2010-11-24 LAB — GLUCOSE, CAPILLARY
Glucose-Capillary: 98 mg/dL (ref 70–99)
Glucose-Capillary: 115 mg/dL — ABNORMAL HIGH (ref 70–99)
Glucose-Capillary: 154 mg/dL — ABNORMAL HIGH (ref 70–99)

## 2010-11-24 LAB — ABO/RH: ABO/RH(D): A POS

## 2010-11-25 LAB — HEMOGLOBIN A1C
Hgb A1c MFr Bld: 5.5 % (ref <5.7)
Mean Plasma Glucose: 111 mg/dL (ref <117)

## 2010-11-25 LAB — GLUCOSE, CAPILLARY: Glucose-Capillary: 122 mg/dL — ABNORMAL HIGH (ref 70–99)

## 2010-11-25 LAB — BASIC METABOLIC PANEL
GFR calc Af Amer: >60 mL/min (ref >60)
GFR calc non Af Amer: >60 mL/min (ref >60)
Potassium: 4.6 mEq/L (ref 3.5–5.1)
Sodium: 136 mEq/L (ref 135–145)

## 2010-11-25 LAB — CBC
Hemoglobin: 11.2 g/dL — ABNORMAL LOW (ref 12.0–15.0)
MCHC: 33.9 g/dL (ref 30.0–36.0)
RDW: 14.2 % (ref 11.5–15.5)

## 2010-11-26 LAB — CBC
HCT: 30.9 % — ABNORMAL LOW (ref 36.0–46.0)
Hemoglobin: 10.2 g/dL — ABNORMAL LOW (ref 12.0–15.0)
MCH: 31.2 pg (ref 26.0–34.0)
MCHC: 33 g/dL (ref 30.0–36.0)

## 2010-11-26 LAB — BASIC METABOLIC PANEL
BUN: 26 mg/dL — ABNORMAL HIGH (ref 6–23)
Calcium: 8.8 mg/dL (ref 8.4–10.5)
GFR calc non Af Amer: 52 mL/min — ABNORMAL LOW (ref >60)
Glucose, Bld: 116 mg/dL — ABNORMAL HIGH (ref 70–99)
Potassium: 3.6 mEq/L (ref 3.5–5.1)

## 2010-12-08 NOTE — Op Note (Signed)
[name], [name]            ACCOUNT NO.:  [id number]  MEDICAL RECORD NO.:  [id number]  LOCATION:  1614                         FACILITY:  [facility]  PHYSICIAN:  [name], M.D.  DATE OF BIRTH:  22-Nov-1955  DATE OF PROCEDURE:  11/24/2010 DATE OF DISCHARGE:                              OPERATIVE REPORT   INDICATION AND JUSTIFICATION FOR PROCEDURE:  Chronic left knee osteoarthritis, status post extensive conservative care with continued pain and x-ray showing bone against bone medically.  She walks with a limb and hurts with weightbearing.  PREOPERATIVE DIAGNOSIS:  End-stage osteoarthritis, left knee.  SURGICAL PROCEDURE:  Left total knee arthroplasty with computer navigation assistance.  POSTOPERATIVE DIAGNOSES: 1. End-stage osteoarthritis, left knee. 2. Morbid obesity.  SURGEON:  [name], M.D.  ASSISTANT:  [name], PAC - present and participated in entire procedure.  ANESTHESIA:  General with femoral nerve block.  INDICATIONS FOR PROCEDURE:  The patient is a 213 pound 5 feet 3 inch female, alert and oriented, pleasant with worn out knee, complete bone against bone medical compartment, lateral compartment with half fibrinated bone on the femur and patellofemoral articulation is also very rough.  DESCRIPTION OF PROCEDURE:  Under general anesthetic, the left leg was prepped with DuraPrep, draped as a sterile field.  Sterile tourniquet was applied proximally and legs with abducted with Esmarch.  Tourniquet was used to 350 mmHg.  A midline incision is made.  The patella was everted.  The knee was examined.  The femur is sized as a medium.  The knee was then debrided in preparation for computer mapping.  Schanz pins were then placed with punctures in the superior medial tibia and the distal femur.  The rays are set up.  Femoral head is found.  Medial and lateral malleoli are found.  Proximal tibia and distal femur were then mapped to less than 1 mm  reproducible accuracy.  Using the computer, the proximal tibial resection was then done for 10 mm off the high side, 3 off the low.  The proximal tibia was removed.  The tensioner was then used and the knee ligaments were balanced within 1 degree of anatomical alignment from the hip to the ankle.  The flexion gap is then measured using the first femoral guide.  The anterior and posterior flare of the distal femur are resected.  Using the second guide, the distal femoral cut is made.  Flexion and extension gaps were matched, slightly overt 10 mm.  Remnants to the menisci and cruciate were then removed.  Spur was then off the back of femoral condyles.  The recession guide was then inserted.  Due to her large weight and small size, a decision was made to go with the revision tibial stem, mobile bearing, and this gives much more support for short heavy person.  The MBT tray was then inserted, size 3, size 12.5 mobile bearing, and a medium femur.  Excellent fit alignment and stability were obtained.  Patella was osteotomized and 3- peg patella used.  With these components in place, the knee has excellent stability and alignment within 1 degree of anatomic and full extension.  The rays were taken down, permanent components obtained. Bony  surface was prepared with pulse irrigation.  All components were then cemented in with SmartSet HV bone cement.  Permanent components were then inserted.  The tibial rotating platform tray MBT revision, actually size 2.5 cemented.  The 12.5 bearing, the medial femoral component, and the medium patella.  With all components in place, the medium Hemovac drain is inserted.  Synovectomy was carried out.  Once cement is hardened, tourniquet was let down at 62 minutes.  Multiple small vessels were cauterized.  The knee was then closed in layers with #1 Ti-Cron, #1 Vicryl, 2-0 Vicryl, and skin clips.  The patient tolerate the procedure well and returned to recovery in  good condition.     [name], M.D.     [name]  D:  11/24/2010  T:  11/24/2010  Job:  [id number]  Electronically Signed by [name] M.D. on 12/08/2010 03:05:25 PM

## 2010-12-15 NOTE — Discharge Summary (Signed)
[name], [name]            ACCOUNT NO.:  [id number]  MEDICAL RECORD NO.:  [id number]  LOCATION:  1614                         FACILITY:  [facility]  PHYSICIAN:  Emonii Wienke L. Rendall, M.D.  DATE OF BIRTH:  [birth year]  DATE OF ADMISSION:  11/24/2010 DATE OF DISCHARGE:  11/26/2010                              DISCHARGE SUMMARY   ADMISSION DIAGNOSES: 1. End-stage osteoarthritis, left knee. 2. Hypertension. 3. Type 2 diabetes mellitus. 4. Obesity. 5. Hypercholesterolemia. 6. Anxiety.  DISCHARGE DIAGNOSES: 1. End-stage osteoarthritis left knee, status post left total knee     arthroplasty. 2. Acute blood loss anemia secondary to surgery. 3. Hypertension. 4. Type 2 diabetes mellitus. 5. Obesity. 6. Hypercholesterolemia. 7. Anxiety.  SURGICAL PROCEDURE:  On November 24, 2010, [name] underwent a left total knee arthroplasty with computer navigation by Dr. Jonny Ruiz L. Rendall, assisted by Arnoldo Morale PA-C.  She has a DePuy LCS complete metal-backed patella cemented size medium placed with an M.B.T. revision tibial tray size 2.5 cemented.  A primary femoral component cemented size medium left and LCS complete tibial insert rotating platform 12.5 mm thickness medium.  COMPLICATIONS:  None.  CONSULTS:  Physical therapy and occupational therapy consult, November 25, 2010.  HISTORY OF PRESENT ILLNESS:  This 55 year old white female patient presented to Dr. Priscille Kluver with history of a left knee open meniscectomy in 1987 and knee scoped in 2007 and a 1 year history of gradual onset progressively worsening left knee pain.  She had a history of fall in 1987, resulting in her first surgery, but no other injury.  At this point, her left knee pain is a constant, sharp, to throb in the medial and lateral aspect of the knee with radiation up and down the leg with spasms.  Pain increases with walking and standing and decreases with rest and elevation.  The knee pops, gives way, locks, and keeps her  up at night.  She has failed conservative treatment including cortisone shots with minimal relief.  Vicodin provides only minimal relief and she has had to use a cane for the last 3 months.  Because of this, she is presenting for a left knee replacement.  HOSPITAL COURSE:  [name] tolerated her surgical procedure well without immediate postoperative complications.  She was transferred to the orthopedic floor.  Postop day #1, she was afebrile, vitals were stable.  Hemoglobin was 11.2, hematocrit 33.  Her leg was neurovascularly intact.  Hemovac was discontinued.  She was weaned off her oxygen and PCA and IV.  Foley was discontinued the next morning and she was started on therapy per protocol.  On postop day #2, she still had some complaints of knee pain, but it was controlled with the medications.  Hemoglobin was stable at 10.2 with hematocrit of 30.9.  Her leg was neurovascularly intact and she did well enough with therapy that day that she felt she was okay to be discharged home.  She was discharged home later that day.  MEDICATIONS:  Please see the patient medication discharge instruction sheet for complete documentation of her medications.  We did place her on Celebrex, Robaxin, Percocet, and Xarelto and on her home medications that she came in with.  We  have her hold her aspirin until she was done the Xarelto and had her hold her Extra Strength Tylenol and hydrocodone. Again, you can see complete documentation and dosing on the home medication discharge sheet.  DISCHARGE INSTRUCTIONS: 1. Diet:  She is to resume her regular diet. 2. Activity:  She can be out of bed, weightbearing as tolerated on the     left leg with use of a walker.  She is to have home CPM 0 to 90     degrees 6-8 hours a day.  No lifting or driving for 6 weeks.     Please see the white total joint discharge sheet for further     activity instructions. 3. Wound Care:  Please see the white total joint  discharge sheet for     further wound care instructions. 4. Follow up:  She is to follow up with Dr. Priscille Kluver in our office on     Tuesday, December 06, 2010, and needs to call [phone] for that     appointment.  She is also arranged for home health PT per [facility].  LABORATORY DATA:  Hemoglobin/hematocrit ranged from 11.2 and 33 on the 27th to 10.2 and 30.9 on the 28th.  Platelets and white count were within normal limits.  Glucose ranged from 128 on the 27th to 116 on the 28th.  BUN and creatinine were 14 and 0.93 on the 27th and went to 26 and 1.09 on the 28th.  Hemoglobin A1c on July 27th was 5.5%.  All other laboratory studies were within normal limits.     Legrand Pitts Duffy, P.A.   ______________________________ Carlisle Beers. Priscille Kluver, M.D.    KED/MEDQ  D:  12/08/2010  T:  12/09/2010  Job:  [id number]  Electronically Signed by Arnoldo Morale P.A. on 12/09/2010 08:17:29 AM Electronically Signed by Erasmo Leventhal M.D. on 12/15/2010 10:25:38 AM

## 2010-12-19 ENCOUNTER — Ambulatory Visit (HOSPITAL_COMMUNITY): Payer: BC Managed Care – PPO | Admitting: Physical Therapy

## 2010-12-26 ENCOUNTER — Ambulatory Visit (HOSPITAL_COMMUNITY)
Admission: RE | Admit: 2010-12-26 | Discharge: 2010-12-26 | Disposition: A | Discharge status: Home / Self Care | Payer: BC Managed Care – PPO | Source: Ambulatory Visit | Attending: Orthopedic Surgery | Admitting: Orthopedic Surgery

## 2010-12-26 DIAGNOSIS — R262 Difficulty in walking, not elsewhere classified: Secondary | ICD-10-CM | POA: Insufficient documentation

## 2010-12-26 DIAGNOSIS — M25669 Stiffness of unspecified knee, not elsewhere classified: Secondary | ICD-10-CM | POA: Insufficient documentation

## 2010-12-26 DIAGNOSIS — M25569 Pain in unspecified knee: Secondary | ICD-10-CM | POA: Insufficient documentation

## 2010-12-26 DIAGNOSIS — M6281 Muscle weakness (generalized): Secondary | ICD-10-CM | POA: Insufficient documentation

## 2010-12-26 DIAGNOSIS — IMO0001 Reserved for inherently not codable concepts without codable children: Secondary | ICD-10-CM | POA: Insufficient documentation

## 2010-12-26 NOTE — Progress Notes (Signed)
Physical Therapy Evaluation  Patient Details  Name: [name] MRN: [id number] Date of Birth: 28-Jul-1955  Today's Date: 12/26/2010 Time: 1308-6578 Time Calculation (min): 49 min Charges: 1 eval Visit#: 1 of 12 Re-eval: 01/23/11  Subjective Symptoms/Limitations Symptoms: L TKR on 11/24/10 after 2 previous arthroscopes  Assessment PROM: 0-95 degrees AROM: 7-75 degrees   STRENGTH: L: Hip: flexion: 3/5;  Glute Med: 3+/5; Glute Max: 3+/5Adductors: 2+/5   L: Knee: Quads: 3/5; Hamstrings: 3/5  Mobility (including Balance)   Ambulating with SPC w/decreased knee flexion and heel strike, lumbar flexion w/anatalgic gait.     Exercise/Treatments Prone:  SLR x10  Knee Flexion x10 Supine:  QS 10x5 sec hold  SAQ 10x  Active HS stretch 30 sec  ITB stretch 30 sec Sidelying:  Hip Abduction x10  Modalities Modalities: Cryotherapy Cryotherapy Number Minutes Cryotherapy: 10 Minutes Cryotherapy Location: Knee Type of Cryotherapy: Ice pack  Physical Therapy Assessment and Plan PT Assessment and Plan Clinical Impression Statement: Pt is a 55 y.o female referred to PT s/p L TKR.  After examination it was found that the patient has current body structure impairments including increased pain, decreased functional strength, impaired balance, decreased ROM, and impaired perceived functional ability which are limiting her in the ability to participate in household and community related activities.  Pt will benefit from skilled physical therapy service to address the above body structure impairments in order to maximize function in order to improve quality of life. Rehab Potential: Good PT Frequency: Min 3X/week PT Duration: 8 weeks PT Treatment/Interventions: DME instruction;Gait training;Stair training;Functional mobility training;Therapeutic exercise;Patient/family education;Neuromuscular re-education;Balance training;Other (comment) (Manual and modalities for ROM and pain control) PT  Plan: Add: bike, Supine SLR, Standing TKE, Gastroc stretch, functional squats, heel raises, toe raises    Goals    Problem List There is no problem list on file for this patient.   PT - End of Session Activity Tolerance: Patient tolerated treatment well   Yoshimi Sarr 12/26/2010, 12:42 PM  Physician Documentation Your signature is required to indicate approval of the treatment plan as stated above.  Please sign and either send electronically or make a copy of this report for your files and return this physician signed original.   Please mark one 1.__approve of plan  2. ___approve of plan with the following conditions.   ______________________________                                                          _____________________ Physician Signature                                                                                                             Date

## 2010-12-28 ENCOUNTER — Ambulatory Visit (HOSPITAL_COMMUNITY)
Admission: RE | Admit: 2010-12-28 | Discharge: 2010-12-28 | Disposition: A | Discharge status: Home / Self Care | Payer: BC Managed Care – PPO | Source: Ambulatory Visit | Attending: Physical Therapy | Admitting: Physical Therapy

## 2010-12-28 DIAGNOSIS — M25569 Pain in unspecified knee: Secondary | ICD-10-CM | POA: Insufficient documentation

## 2010-12-28 DIAGNOSIS — M25669 Stiffness of unspecified knee, not elsewhere classified: Secondary | ICD-10-CM | POA: Insufficient documentation

## 2010-12-28 DIAGNOSIS — Z96659 Presence of unspecified artificial knee joint: Secondary | ICD-10-CM | POA: Insufficient documentation

## 2010-12-28 NOTE — Progress Notes (Signed)
Patient Details  Name: [name] MRN: [id number] Date of Birth: [birth year]  Today's Date: 12/26/2010 Clinical Evaluation for Physical Therapy Services  Patient: [name] Initial Eval : 12/25/10  Physician: Priscille Kluver DOB: 08/07/1955  Age: 55 Diagnosis: L TKR  Onset Date: 11/24/10 Dx Code: 719.46, 719.56  Employer: [employer] Date of Surgery: 11/24/10  Occupation: head cashier  Case Manager: N/A  Next MD visit: 01/03/11 Deanne Coffer #: N/A/   HISTORY: Pt is a55 year old female referred to PT s/p L TKR.  She reports that everything is difficult for her to do.  She reports that her PROM was 0-105 with HHPT while in the seated position.  She reports she had decreased ROM before her TKR secondary to multiple L knee surgeries.   MEDICAL HISTORY: significant for knee surgeries PREVIOUS THERAPY: HHPT for 4 weeks.   Cognitive Status: Pt is alert and oriented x4 Social History: Lives with her husband, enjoys yard work and mowing, crafts.   Current Functional Status: Lower extremity functional scale:.19/80.  Sleep: wakes every few hours, Walk: 5 minutes, Stand: 5 minutes, Sitting: 5 minutes.  Husband is helping with all ADL's.  Prior Functional Status: Working full time   walk and stand 8-10 hours a day.   Barriers to Education: None EMPLOYMENT DATA: Plans to return to work.    SUBJECTIVE:  PATIENT GOAL:  "I want to make sure that I can get back to work where I do not have restrictions."  PAIN RATING: (on a scale from 0-10): current:  7/10  to L Knee.  Range: 2-10/10  to L knee  Nature: sharp pain, feeling of increased pressure .  Aggravating: increased walking activities Alleviating: ice packs and propping her knee up.  OBJECTIVE: POSTURE: WNL OBSERVATION: mild edema to her L knee, incision is healing well, minimal blister to her incision from increase friction   PROM: 0-95 degrees AROM: 7-75 degrees   STRENGTH: RLE: WNL L: Hip: flexion: 3/5;  Glute Med: 3+/5; Glute Max:  3+/5Adductors: 2+/5   L: Knee: Quads: 3/5; Hamstrings: 3/5 GAIT: SPC w/decreased L knee flexion, heel strike and increase lumbar flexion.  FUNCTION: See above. Unable to perform L SLR.  5 STS: 16.6 sec w/BUE support, Balance: unable to balance on LLE PALPATION: Increased pain and tenderness to her L knee. Special Test:  - S/S of DVT, - Homan's sign, + 90/90 Hamstrings    ASSEMENT: Pt is a 55 y.o female referred to PT s/p L TKR.  After examination it was found that the patient has current body structure impairments including increased pain, decreased functional strength, impaired balance, decreased ROM, and impaired perceived functional ability which are limiting her in the ability to participate in household and community related activities.  Pt will benefit from skilled physical therapy service to address the above body structure impairments in order to maximize function in order to improve quality of life.      TREATMENT DIAGNOSIS: Knee Pain: 719.46, Knee stiffness 719.56 Rehab Potential: Good PLAN: 1. Therapeutic exercise to include stretching, strengthening and HEP. 2. Gait training 3. Balance re-education 4. Manual techniques and modalities as needed for pain reduction.  FREQUENCY / DURATION: 2 x/week x 8 weeks. SHORT TERM GOALS: to be met in 2 weeks. Patient will be able to: 1. Pt will be Independent in HEP in order to maximize therapeutic effect. 2. Pt will report pain less than 4/10 during 50%  of her day 3. Pt will demonstrate RLE SLS x10 sec.  4. Pt will improve AROM to 0-100 LONG TERM GOALS: to be met in 8 weeks. Patient will be able to: 1. Pt will improve knee AROM to [facility] in order to ambulate with appropriate gait mechanics. 2. Pt will increase LE strength to [facility] in order to ambulate and stand for greater than an hour to participate in work activities. 3. Pt will report pain less than or equal to 3/10 for 75% of her day for improved quality of life. 4. Pt will improve her  LEFS score by 9 points for improved perceived functional ability.  INITIAL TREATMENT: Initial evaluation, therapeutic exercise and education in HEP. Pt agreeable to treatment plan.  PRECAUTIONS:N/A CONTRAINDICATIONS: N/A. Thank you for your referral,   ____________________________                            ______________________________   Annett Fabian, PT Date                      Physician Treatment Plan Checklist Your signature is required to indicate approval of the treatment plan as stated above.  Please make a copy of this report for your files and return this physician signed original in the self-addressed envelope. PLEASE CHECK ONE: _____ 1. APPROVE PLAN _____2. APPROVE PLAN WITH THE FOLLOWING CHANGES: ______ __________________________________________________________________________________ ________________________________   __________________________ PHYSICIAN SIGNATURE       DATE   Hai Grabe 12/28/2010, 4:33 PM

## 2010-12-28 NOTE — Progress Notes (Signed)
Physical Therapy Treatment Patient Details  Name: [name] MRN: [id number] Date of Birth: [birth year]  Today's Date: 12/28/2010 Time: 7829-5621 Time Calculation (min): 43 min Visit#:  2 of 12 Re-eval: 01/23/11 Charges:  Therex:  25', ice 10'  Subjective: Symptoms/Limitations Symptoms: Pt. states her pain is a 4/10 after taking pain meds.  Pt. s/p L TKR. Pain Assessment Currently in Pain?: Yes Pain Score:   4 Pain Location: Knee Pain Orientation: Left Pain Type: Surgical pain        Exercise/Treatments Stretches Hamstring stretch 3X30" supine ITB stretch 3X30" supine   Aerobic Stationary Bike: 6' seat 8 rocking only         Supine Quad Sets: 10 reps Short Arc Quad Sets: 10 reps Heel Slides: 10 reps Straight Leg Raises: 10 reps;AAROM Other Supine Knee Exercises: PROM for flexion Sidelying Hip ABduction: 10 reps Prone  Hamstring Curl: 10 reps Hip Extension: 10 reps Other Prone Exercises: terminal knee flexion 10X   Modalities Modalities: Cryotherapy Manual Therapy Manual Therapy: Joint mobilization Joint Mobilization: PROM for L knee flexion Cryotherapy Number Minutes Cryotherapy: 10 Minutes Cryotherapy Location: Knee Type of Cryotherapy: Ice pack  Physical Therapy Assessment and Plan PT Assessment and Plan Clinical Impression Statement: Good tolerance for PROM.  Added SLR, needing AA secondary to weakness.  Unable to complete full revolution on bike. PT Treatment/Interventions: Therapeutic exercise (PROM and ice) PT Plan: Progress stength and ROM; add standing TKE, gastroc stretch, standing squats, heel and toeraises next visit.      Problem List S/P L TKR  PT - End of Session Activity Tolerance: Patient tolerated treatment well General Behavior During Session: [facility] for tasks performed Cognition: [facility] for tasks performed  Emeline Gins B 12/28/2010, 9:32 AM

## 2010-12-30 ENCOUNTER — Inpatient Hospital Stay (HOSPITAL_COMMUNITY): Admission: RE | Admit: 2010-12-30 | Payer: BC Managed Care – PPO | Source: Ambulatory Visit

## 2010-12-30 ENCOUNTER — Telehealth (HOSPITAL_COMMUNITY): Payer: Self-pay

## 2011-01-04 ENCOUNTER — Ambulatory Visit (HOSPITAL_COMMUNITY)
Admission: RE | Admit: 2011-01-04 | Discharge: 2011-01-04 | Disposition: A | Discharge status: Home / Self Care | Payer: BC Managed Care – PPO | Source: Ambulatory Visit | Attending: Orthopedic Surgery | Admitting: Orthopedic Surgery

## 2011-01-04 DIAGNOSIS — M25669 Stiffness of unspecified knee, not elsewhere classified: Secondary | ICD-10-CM | POA: Insufficient documentation

## 2011-01-04 DIAGNOSIS — M6281 Muscle weakness (generalized): Secondary | ICD-10-CM | POA: Insufficient documentation

## 2011-01-04 DIAGNOSIS — M25569 Pain in unspecified knee: Secondary | ICD-10-CM | POA: Insufficient documentation

## 2011-01-04 DIAGNOSIS — IMO0001 Reserved for inherently not codable concepts without codable children: Secondary | ICD-10-CM | POA: Insufficient documentation

## 2011-01-04 DIAGNOSIS — R262 Difficulty in walking, not elsewhere classified: Secondary | ICD-10-CM | POA: Insufficient documentation

## 2011-01-04 NOTE — Progress Notes (Signed)
Physical Therapy Treatment Patient Details  Name: [name] MRN: [id number] Date of Birth: July 04, 1955  Today's Date: 01/04/2011 Time: 4098-1191 Time Calculation (min): 55 min Visit#: 3 of 12 Re-eval: 01/23/11 Charge: therex: 39 min Ice 10 min  Subjective: Symptoms/Limitations Symptoms: 7/10 L knee, pt thinks the increased pain is secondary to the weather. Pain Assessment Currently in Pain?: Yes Pain Score:   7 Pain Location: Knee Pain Orientation: Left Pain Type: Surgical pain Pain Radiating Towards: Pain medication helps some, ice on a warm day Pain Onset: More than a month ago Pain Frequency: Constant Multiple Pain Sites: No  Objective:   Exercise/Treatments  Aerobic  Stationary Bike: 6' seat 8 rocking only  Standing:  Heel raise 10 Toe raise 10 Functional squats 10 TKE 10x 5" with blue tband Gastroc st 3x 30" Soleus St 3x 30" Supine  Quad Sets: 10 reps  Short Arc Quad Sets: 10 reps  Heel Slides: 10 reps  Straight Leg Raises: 10 reps; TKE 10 x 5" with towel under knee Hamstring st 3x 30" ITB st 3x 30" Other Supine Knee Exercises: PROM for flexion  Sidelying  Hip ABduction: 10 reps  Prone  Hamstring Curl: 10 reps  Hip Extension: 10 reps  Other Prone Exercises: terminal knee flexion 10X   Physical Therapy Assessment and Plan PT Assessment and Plan Clinical Impression Statement: Pt tolerated PROM well.  ROM/Strength improving, pt able to perform SLR today with no AA.   PT Plan: Progress strength/ROM. Begin step ups next tx    Goals    Problem List Patient Active Problem List  Diagnoses  . S/P total knee replacement  . Knee pain  . Knee stiffness    PT - End of Session Activity Tolerance: Patient tolerated treatment well General Behavior During Session: [facility] for tasks performed Cognition: [facility] for tasks performed  Juel Burrow 01/04/2011, 5:25 PM

## 2011-01-06 ENCOUNTER — Ambulatory Visit (HOSPITAL_COMMUNITY)
Admission: RE | Admit: 2011-01-06 | Discharge: 2011-01-06 | Disposition: A | Discharge status: Home / Self Care | Payer: BC Managed Care – PPO | Source: Ambulatory Visit

## 2011-01-06 NOTE — Progress Notes (Signed)
Physical Therapy Treatment Patient Details  Name: STACHIA SLUTSKY MRN: [id number] Date of Birth: September 30, 1955  Today's Date: 01/06/2011 Time: 4098-1191 Time Calculation (min): 65 min Visit#: 4 of 12 Re-eval: 01/23/11 Charge: therex 45 min Manual 5 min Ice 10 min  Subjective: Symptoms/Limitations Symptoms: 4/10 L knee secondary to the weather (raining outside).  Pt able to make a  full recumbent cycle on bike for first time today. Pain Assessment Currently in Pain?: Yes Pain Score:   4 Pain Location: Knee Pain Orientation: Left Pain Type: Surgical pain Pain Radiating Towards: Ice, getting up and walking to loosen it up, bending knee  Precautions/Restrictions     Mobility (including Balance)       Exercise/Treatments  Aerobic  Stationary Bike: 6' seat 8 rocking only  Standing:  Heel raise 10  Toe raise 10  Functional squats 10  Forward step up 10 4" Lateral step up 10x 2" TKE 10x 5" with blue tband  Hip ext 10x with blue tband Hip abd 10x with blue tband Gastroc st 3x 30"  Soleus St 3x 30"  Supine  Quad Sets: 10 reps x 5" Short Arc Quad Sets: 10 reps  Heel Slides: 10 reps  Straight Leg Raises: 10 reps;  TKE 10 x 5" with towel under knee  Hamstring st 3x 30"  Other Supine Knee Exercises: PROM for flexion  Sidelying  Hip ABduction: 10 reps  Prone  Hamstring Curl: 10 reps  Hip Extension: 10 reps  Other Prone Exercises: terminal knee flexion 10X  Ice 10 min  Physical Therapy Assessment and Plan PT Assessment and Plan Clinical Impression Statement: Pt progressing well towards total treatment.  Began forward and lateral step up, vc/manual assistance required to bend L knee and reduce hip hike with lateral, lowered to 2" step for lateral step up, pt performed correctly with lower step.  ROM/strength improving. PT Plan: Progress strength/ROM, step down when ready.    Goals    Problem List Patient Active Problem List  Diagnoses  . S/P total knee replacement    . Knee pain  . Knee stiffness    PT - End of Session Activity Tolerance: Patient tolerated treatment well General Behavior During Session: [facility] for tasks performed Cognition: [facility] for tasks performed  Juel Burrow 01/06/2011, 9:46 AM

## 2011-01-09 ENCOUNTER — Ambulatory Visit (HOSPITAL_COMMUNITY)
Admission: RE | Admit: 2011-01-09 | Discharge: 2011-01-09 | Disposition: A | Discharge status: Home / Self Care | Payer: BC Managed Care – PPO | Source: Ambulatory Visit | Attending: Family Medicine | Admitting: Family Medicine

## 2011-01-09 NOTE — Progress Notes (Signed)
Physical Therapy Treatment Patient Details  Name: [name] MRN: [id number] Date of Birth: [birth year]  Today's Date: 01/09/2011 Time: 9629-5284 Time Calculation (min): 58 min Charges: 45' TE, ice Visit#: 5 of 12 Re-eval: 01/23/11  Subjective: Symptoms/Limitations Symptoms: I am just feeling a little stiff this morning, but overall I am doing pretty well I think.   Precautions/Restrictions     Mobility (including Balance)  Ambulates with SPC w/decreased knee flexion and Trendelenberg gait.      Exercise/Treatments Stretches Gastroc Stretch: 3 reps;30 seconds Soleus Stretch: 3 reps;30 seconds Aerobic Stationary Bike: 6' 2.0 seat 9 Machines for Strengthening Cybex Knee Extension: 1.5 PL 3x10 Cybex Knee Flexion: 3 PL 3x10 Standing Lateral Step Up: Left;10 reps;Step Height: 4" Forward Step Up: 1 set;10 reps;Step Height: 4" Functional Squat: 2 sets;10 reps Sidelying Hip ABduction: Left;3 sets;10 reps Clams: 2x10 Prone  Hamstring Curl: 3 sets;10 reps;Limitations Hamstring Curl Limitations: W/ knee prop on towel Hip Extension: 3 sets;10 reps Straight Leg Raises: Left;3 sets;10 reps   Modalities Modalities: Cryotherapy Manual Therapy Manual Therapy: Joint mobilization Joint Mobilization: PROM 0-110 degrees Cryotherapy Number Minutes Cryotherapy: 10 Minutes Cryotherapy Location: Knee Type of Cryotherapy: Ice pack  Physical Therapy Assessment and Plan PT Assessment and Plan Clinical Impression Statement: Pt tolerated all new ther-ex with increased weights and reps without an increae in pain.  Continues to demonstrate greatest weakness with hip abduction which is noted on stairs and with Trendelenberg  gait  PT Plan: Progress strength/ROM, step down when ready, walking lunges    Goals    Problem List Patient Active Problem List  Diagnoses  . S/P total knee replacement  . Knee pain  . Knee stiffness    PT - End of Session Activity Tolerance: Patient  tolerated treatment well  Avelyn Touch 01/09/2011, 10:48 AM

## 2011-01-11 ENCOUNTER — Ambulatory Visit (HOSPITAL_COMMUNITY)
Admission: RE | Admit: 2011-01-11 | Discharge: 2011-01-11 | Disposition: A | Discharge status: Home / Self Care | Payer: BC Managed Care – PPO | Source: Ambulatory Visit

## 2011-01-11 NOTE — Progress Notes (Signed)
Physical Therapy Treatment Patient Details  Name: [name] MRN: [id number] Date of Birth: 17-Jan-1956  Today's Date: 01/11/2011 Time: 8295-6213 Time Calculation (min): 76 min Visit#: 6 of 12 Re-eval: 01/23/11 Charge: therex 54 min IFES/Ice 15 min  Subjective: Symptoms/Limitations Symptoms: Think last session got a little too much push, pain level 6/*10 L knee Pain Assessment Currently in Pain?: Yes Pain Score:   6 Pain Location: Knee Pain Orientation: Left  Precautions/Restrictions     Mobility (including Balance)       Exercise/Treatments Aerobic  Stationary Bike: 6' 2.0 seat 9 5 Machines for Strengthening  Cybex Knee Extension: 1.5 PL 3x10  Cybex Knee Flexion: 3.5 PL 3x10  Standing  Lateral Step Up: Left;10 reps;Step Height: 4"  Forward Step Up: 1 set;10 reps;Step Height: 4"  Step down 1 set 10 reps step height 2" Functional Squat: 2 sets;10 reps  Walking lunges 1 RT with vc for correct stance/ posture. TKE 10x 5" Supine: Heel slides 2sets 10 reps Quad sets 2 sets; 10 reps 5" holds PROM 3x 30" each direction IFES/Ice 15 min Modalities Modalities: Cryotherapy;Electrical Stimulation Cryotherapy Number Minutes Cryotherapy: 15 Minutes Cryotherapy Location: Knee Type of Cryotherapy: Ice pack Pharmacologist Location: L knee Electrical Stimulation Action: to reduce pain Electrical Stimulation Parameters: Dynaton 950 IFES with Ice hi/lo sweep L 15 Electrical Stimulation Goals: Pain  Physical Therapy Assessment and Plan PT Assessment and Plan Clinical Impression Statement: Pt tolerated treatment well with the increased pain at entrance.  Began lunge walking for increased knee flexion with vc for correct stance positioning and posutre, added step down with  lower height, pt presented with weak eccentric quad control.  Ended session with IFES/Ice with reduced pain to 4/10. PT Plan: Progress strenght, ROM and assess pain next  session.    Goals    Problem List Patient Active Problem List  Diagnoses  . S/P total knee replacement  . Knee pain  . Knee stiffness    PT - End of Session Activity Tolerance: Patient tolerated treatment well General Behavior During Session: [facility] for tasks performed Cognition: [facility] for tasks performed  Juel Burrow 01/11/2011, 11:17 AM

## 2011-01-13 ENCOUNTER — Ambulatory Visit (HOSPITAL_COMMUNITY)
Admission: RE | Admit: 2011-01-13 | Discharge: 2011-01-13 | Disposition: A | Discharge status: Home / Self Care | Payer: BC Managed Care – PPO | Source: Ambulatory Visit

## 2011-01-13 NOTE — Progress Notes (Signed)
Physical Therapy Treatment Patient Details  Name: [name] MRN: [id number] Date of Birth: Mar 26, 1956  Today's Date: 01/13/2011 Time: 0865-7846 Time Calculation (min): 64 min Visit#: 7 of 12 Re-eval: 01/23/11  Charge: therex 48 min Ice 10 min  Subjective: Symptoms/Limitations Symptoms: My knee is a little better today.  Pain Assessment Currently in Pain?: Yes Pain Score:   4 Pain Location: Knee Pain Orientation: Right Pain Type: Surgical pain  Precautions/Restrictions     Mobility (including Balance)       Exercise/Treatments Aerobic  Stationary Bike: 6' 2.0 seat 9 5  Machines for Strengthening  Cybex Knee Extension: 2.0 PL 2x10  Cybex Knee Flexion: 4.5 PL 2x10  Standing  Lateral Step Up: Left;10 reps;Step Height: 2"  Forward Step Up: 1 set;10 reps;Step Height: 4"  Step down 1 set 10 reps step height 2"  Functional Squat: 2 sets;10 reps  Walking lunges 1 RT with vc for correct stance/ posture.  TKE 2 sets 10x 5"  SLS 33" max of 3 Supine:  Heel slides 2sets 10 reps  Quad sets 2 sets; 10 reps 5" holds  PROM 3x 30" each direction SLR 2x 10 Sidelying: Abd 3x 10 Clam 2x 10 Ice x    Physical Therapy Assessment and Plan PT Assessment and Plan Clinical Impression Statement: Pt making great progress towards overall goals.  Began SLS with 33" max.  Pt did c/o of increase pain with 4" lateral step, dropped down to 2" without c/o, required manual/vc to reduce hip hiking for quad strengthening.  Held IFES at end of session, ice only with reduced pain. PT Plan: Continue with current POC, progress strength and ROM.    Goals    Problem List Patient Active Problem List  Diagnoses  . S/P total knee replacement  . Knee pain  . Knee stiffness    PT - End of Session Activity Tolerance: Patient tolerated treatment well General Behavior During Session: [facility] for tasks performed Cognition: [facility] for tasks performed  Juel Burrow 01/13/2011, 6:22  PM

## 2011-01-16 ENCOUNTER — Ambulatory Visit (HOSPITAL_COMMUNITY)
Admission: RE | Admit: 2011-01-16 | Discharge: 2011-01-16 | Disposition: A | Discharge status: Home / Self Care | Payer: BC Managed Care – PPO | Source: Ambulatory Visit | Attending: Family Medicine | Admitting: Family Medicine

## 2011-01-16 NOTE — Progress Notes (Signed)
Physical Therapy Treatment Patient Details  Name: [name] MRN: [id number] Date of Birth: 05-09-1955  Today's Date: 01/16/2011 Time: 5284-1324 Time Calculation (min): 53 min Visit#: 8 of 12 Re-eval: 01/23/11 Charges:  therex 40', ice 10'    Subjective: Symptoms/Limitations Symptoms: Pt. states her knee pain is not that bad today.  3/10 today. Pain Assessment Pain Score:   3 Pain Location: Knee Pain Orientation: Left   Exercise/Treatments Aerobic Stationary Bike: 6' @ 3.0 seat 9 Machines for Strengthening Cybex Knee Extension: 2 PL 2x 10 Cybex Knee Flexion: 4.5 PL 2x 10 Standing Lateral Step Up: Left;15 reps;Step Height: 2" Forward Step Up: Left;15 reps;Step Height: 2" Step Down: Left;15 reps;Step Height: 2" Functional Squat: 1 set;15 reps;3 seconds Lunge Walking - Round Trips: Risk manager: 1 minute SLS: 44"max of 3 trials Supine Quad Sets: 2 sets;10 reps;Limitations Quad Sets Limitations: 5 second hold Heel Slides: 20 reps Straight Leg Raises: Left;2 sets;10 reps Knee Flexion: PROM;3 sets;Limitations Knee Flexion Limitations: 3x 30" Sidelying Hip ABduction: Left;3 sets;10 reps Clams: 2x10    Modalities Modalities: Cryotherapy Manual Therapy Manual Therapy: Joint mobilization Joint Mobilization: PROM achieved 117 degrees flexion Cryotherapy Number Minutes Cryotherapy: 10 Minutes Cryotherapy Location: Knee Type of Cryotherapy: Ice pack  Physical Therapy Assessment and Plan PT Assessment and Plan Clinical Impression Statement: Pt. continues to have difficult time with step ups/downs; difficulty bending knee and controlling motion eccentrically.  Overall improving ROM. PT Treatment/Interventions: Therapeutic exercise (icepack) PT Plan: Contininue to progress strength and ROM.    Problem List Patient Active Problem List  Diagnoses  . S/P total knee replacement  . Knee pain  . Knee stiffness    PT - End of Session Activity Tolerance:  Patient tolerated treatment well General Behavior During Session: [facility] for tasks performed Cognition: [facility] for tasks performed  Bascom Levels, Princesa Willig B 01/16/2011, 10:30 AM

## 2011-01-19 ENCOUNTER — Ambulatory Visit (HOSPITAL_COMMUNITY): Payer: BC Managed Care – PPO

## 2011-01-20 ENCOUNTER — Ambulatory Visit (HOSPITAL_COMMUNITY): Payer: BC Managed Care – PPO | Admitting: Physical Therapy

## 2011-01-20 LAB — URINALYSIS, ROUTINE W REFLEX MICROSCOPIC
Bilirubin Urine: NEGATIVE
Glucose, UA: NEGATIVE
Hgb urine dipstick: NEGATIVE
Ketones, ur: NEGATIVE
Nitrite: NEGATIVE
Protein, ur: NEGATIVE
Specific Gravity, Urine: >1.03 — ABNORMAL HIGH
Urobilinogen, UA: 0.2
pH: 5

## 2011-01-20 LAB — COMPREHENSIVE METABOLIC PANEL WITH GFR
ALT: 18
AST: 18
Albumin: 3.4 — ABNORMAL LOW
Alkaline Phosphatase: 49
BUN: 29 — ABNORMAL HIGH
CO2: 27
Calcium: 8.9
Chloride: 101
Creatinine, Ser: 0.8
GFR calc non Af Amer: >60
Glucose, Bld: 118 — ABNORMAL HIGH
Potassium: 3.5
Sodium: 137
Total Bilirubin: 0.6
Total Protein: 6.4

## 2011-01-20 LAB — DIFFERENTIAL
Basophils Absolute: 0
Basophils Relative: 0
Eosinophils Absolute: 0
Eosinophils Relative: 1
Lymphocytes Relative: 4 — ABNORMAL LOW
Lymphs Abs: 0.2 — ABNORMAL LOW
Monocytes Absolute: 0.2
Monocytes Relative: 3
Neutro Abs: 5.6
Neutrophils Relative %: 92 — ABNORMAL HIGH

## 2011-01-20 LAB — CBC
HCT: 38.2
Hemoglobin: 13.2
RBC: 4.15
RDW: 14.6

## 2011-01-20 LAB — LIPASE, BLOOD: Lipase: 20

## 2011-01-23 ENCOUNTER — Ambulatory Visit (HOSPITAL_COMMUNITY)
Admission: RE | Admit: 2011-01-23 | Discharge: 2011-01-23 | Disposition: A | Discharge status: Home / Self Care | Payer: BC Managed Care – PPO | Source: Ambulatory Visit | Attending: Physical Therapy | Admitting: Physical Therapy

## 2011-01-23 NOTE — Progress Notes (Signed)
Physical Therapy Treatment Patient Details  Name: [name] MRN: [id number] Date of Birth: 07-26-1955  Today's Date: 01/23/2011 Time: 9147-8295 Time Calculation (min): 53 min Visit#: 9  of 12   Re-eval: 01/25/11    Subjective: Symptoms/Limitations Symptoms: Pt's mother passed away and she was on her feet for prolonged time during the weekend.  Pt has increased pain  Pain Assessment Pain Score:   6 Pain Location: Knee Pain Orientation: Left Pain Type: Surgical pain Pain Onset: More than a month ago          Exercise/Treatments For strengthening and stretching per doc flow sheet.  Modalities Modalities: Cryotherapy Manual Therapy Joint Mobilization: massage for edema 10 min    T  Physical Therapy Assessment and Plan PT Assessment and Plan Clinical Impression Statement: decreased pain with massage, pt showing improved ROM.  Knee flexion and abduction strength is wnl Rehab Potential: Good Clinical Impairments Affecting Rehab Potential: pain PT Plan: D/C strengthening for hip abduction and knee flexion as these are wfl.  Needs re-assessment next visit.    Goals    Problem List Patient Active Problem List  Diagnoses  . S/P total knee replacement  . Knee pain  . Knee stiffness    PT - End of Session Activity Tolerance: Patient tolerated treatment well General Behavior During Session: [facility] for tasks performed Cognition: [facility] for tasks performed  RUSSELL,[name] 01/23/2011, 12:37 PM

## 2011-01-25 ENCOUNTER — Ambulatory Visit (HOSPITAL_COMMUNITY)
Admission: RE | Admit: 2011-01-25 | Discharge: 2011-01-25 | Disposition: A | Discharge status: Home / Self Care | Payer: BC Managed Care – PPO | Source: Ambulatory Visit | Attending: Family Medicine | Admitting: Family Medicine

## 2011-01-25 LAB — DIFFERENTIAL
Basophils Absolute: 0.1
Eosinophils Relative: 1
Lymphocytes Relative: 20
Monocytes Absolute: 0.7
Monocytes Relative: 7
Neutro Abs: 7

## 2011-01-25 LAB — COMPREHENSIVE METABOLIC PANEL
AST: 18
Albumin: 3.6
BUN: 24 — ABNORMAL HIGH
Chloride: 107
Creatinine, Ser: 0.89
GFR calc Af Amer: >60
Potassium: 4.1
Total Bilirubin: 0.5
Total Protein: 7

## 2011-01-25 LAB — CBC
Platelets: 207
RDW: 14.5
WBC: 9.6

## 2011-01-25 LAB — URINALYSIS, ROUTINE W REFLEX MICROSCOPIC
Ketones, ur: NEGATIVE
Protein, ur: NEGATIVE
Urobilinogen, UA: 0.2

## 2011-01-25 NOTE — Progress Notes (Signed)
Physical Therapy Treatment Patient Details  Name: [name] MRN: [id number] Date of Birth: 1956/04/11  Today's Date: 01/25/2011 Time: 4098-1191 Time Calculation (min): 69 min Visit#: 10  of 24   Re-eval: 02/20/11 Charges: Manual x 10' MMTx1  ROMx1 Ice x 10'   Subjective: Symptoms/Limitations Symptoms: Pt reports increased pain in L lateral knee. Pain Assessment Currently in Pain?: Yes Pain Score:   5 Pain Location: Knee Pain Orientation: Left  LEFS 18/80  Improvements: Walking around the house with cane without difficulty. Able to use commode without lift seat. Ascending/descending stairs with increased ease using step to pattern,  Limitations: Not sleeping through the night. Unable to ascend/descend stairs reciporacally. Unable to stand longer than 15'. Cannot sit longer than 20-25'.  Objective:  PROM: 0-118(0-95) degrees AROM: 5-109(7-75) degrees   STRENGTH: RLE: WNL L: Hip: flexion: (3+/5)3/5;  Glute Med: (4-/5)3+/5; Glute Max: 3+/5Adductors: 3-/5(2+/5)   L: Knee: Quads: 4/5(3/5); Hamstrings: 3+/5(3/5)  Exercise/Treatments  Modalities Modalities: Cryotherapy Manual Therapy Manual Therapy: Myofascial release Myofascial Release: MFR and scar tissue mobs to distal scar and ITB (LLE) x10' Cryotherapy Number Minutes Cryotherapy: 10 Minutes Cryotherapy Location: Knee (Left) Type of Cryotherapy: Ice pack  Physical Therapy Assessment and Plan PT Assessment and Plan Clinical Impression Statement: Pt with incrases in ROM and strength. Pt continues to ambulate with cane in community but does not use one at home. Pt with improved balance w/SLS. Pt with pain decrease to 4/10 at end of session. PT Treatment/Interventions: Other (comment) (Ice packl; Manual; ROM; MMT) PT Plan: Continue per PT POC. Focus on gait without AD and strengthening.    Goals PT Short Term Goals Time to Complete Short Term Goals: 4 weeks PT Short Term Goal 1: 1.Pt will be Independent in HEP in  order to maximize therapeutic effect. PT Short Term Goal 1 - Progress: Met PT Short Term Goal 2: 2.Pt will report pain less than 4/10 during 50%  of her day PT Short Term Goal 2 - Progress: Not met PT Short Term Goal 3: 3.Pt will demonstrate LLE SLS x10 sec.  PT Short Term Goal 3 - Progress: Met PT Short Term Goal 4: 4.Pt will improve AROM to 0-100 PT Short Term Goal 4 - Progress: Not met PT Long Term Goals PT Long Term Goal 1: 1.Pt will improve knee AROM to [facility] in order to ambulate with appropriate gait mechanics. PT Long Term Goal 1 - Progress: Not met PT Long Term Goal 2: 2.Pt will increase LE strength to [facility] in order to ambulate and stand for greater than an hour to participate in work activities. PT Long Term Goal 2 - Progress: Not met Long Term Goal 3: 3.Pt will report pain less than or equal to 3/10 for 75% of her day for improved quality of life. Long Term Goal 3 Progress: Not met Long Term Goal 4: 4.Pt will improve her LEFS score by 9 points for improved perceived functional ability.  Long Term Goal 4 Progress: Not met  Problem List Patient Active Problem List  Diagnoses  . S/P total knee replacement  . Knee pain  . Knee stiffness    PT - End of Session Activity Tolerance: Patient tolerated treatment well General Behavior During Session: [facility] for tasks performed Cognition: [facility] for tasks performed  [name] 01/25/2011, 10:55 AM

## 2011-01-27 ENCOUNTER — Ambulatory Visit (HOSPITAL_COMMUNITY): Payer: BC Managed Care – PPO

## 2011-01-30 ENCOUNTER — Ambulatory Visit (HOSPITAL_COMMUNITY)
Admission: RE | Admit: 2011-01-30 | Discharge: 2011-01-30 | Disposition: A | Discharge status: Home / Self Care | Payer: BC Managed Care – PPO | Source: Ambulatory Visit | Attending: Orthopedic Surgery | Admitting: Orthopedic Surgery

## 2011-01-30 DIAGNOSIS — M25569 Pain in unspecified knee: Secondary | ICD-10-CM | POA: Insufficient documentation

## 2011-01-30 DIAGNOSIS — M25669 Stiffness of unspecified knee, not elsewhere classified: Secondary | ICD-10-CM | POA: Insufficient documentation

## 2011-01-30 DIAGNOSIS — M6281 Muscle weakness (generalized): Secondary | ICD-10-CM | POA: Insufficient documentation

## 2011-01-30 DIAGNOSIS — R262 Difficulty in walking, not elsewhere classified: Secondary | ICD-10-CM | POA: Insufficient documentation

## 2011-01-30 DIAGNOSIS — IMO0001 Reserved for inherently not codable concepts without codable children: Secondary | ICD-10-CM | POA: Insufficient documentation

## 2011-01-30 NOTE — Progress Notes (Signed)
Physical Therapy Treatment Patient Details  Name: [name] MRN: [id number] Date of Birth: 03-Jun-1955  Today's Date: 01/30/2011 Time: 0802-0905 Time Calculation (min): 63 min Visit#: 11  of 24   Re-eval: 02/20/11 Charges: therex 35', manual 10', ice 10'    Subjective: Symptoms/Limitations Symptoms: Pt. states her knee really hurts this morning.  Increased antalgic gait this morning. Pain Assessment Currently in Pain?: Yes Pain Score:   6 Pain Location: Knee Pain Orientation: Left  Exercise/Treatments Aerobic Stationary Bike: 6' @ 3.0 seat 9 Machines for Strengthening Cybex Knee Extension: 2 PL 2x 10 Cybex Knee Flexion: 4.5 PL 2x 10 Standing Knee Flexion: 15 reps;Limitations Knee Flexion Limitations: terminal flex on 4" step Terminal Knee Extension: 15 reps;Theraband Theraband Level (Terminal Knee Extension): Level 4 (Blue) Terminal Knee Extension Limitations: 5 second holds Lateral Step Up: Left;15 reps;Step Height: 2" Forward Step Up: Left;15 reps;Step Height: 2" Step Down: Left;15 reps;Step Height: 2" Functional Squat: 15 reps Lunge Walking - Round Trips: 1RT SLS: 51"max Supine Heel Slides: 15 reps Knee Flexion Limitations: 3x 30" Sidelying Hip ABduction: Left;15 reps;Limitations Hip ABduction Limitations: 4# Prone  Hamstring Curl: 5 sets Hamstring Curl Limitations: contract relax to improve flexion Hip Extension: 15 reps Contract/Relax to Increase Flexion: 3#   Manual Therapy Manual Therapy: Myofascial release Myofascial Release: MFR and scar tissue mobs to distal scar and ITB Cryotherapy Type of Cryotherapy: Ice pack  Physical Therapy Assessment and Plan PT Assessment and Plan Clinical Impression Statement: Pt. with improving ROM, however continues to have difficulty with steps.  Almost able to SLS 1 minute today (51"). PT Treatment/Interventions: Therapeutic exercise (Manual techniques and ice) PT Plan: Continue to progress strength, gait  without AD.     Problem List Patient Active Problem List  Diagnoses  . S/P total knee replacement  . Knee pain  . Knee stiffness    PT - End of Session Activity Tolerance: Patient tolerated treatment well General Behavior During Session: [facility] for tasks performed Cognition: [facility] for tasks performed  Emeline Gins B 01/30/2011, 9:03 AM

## 2011-02-01 ENCOUNTER — Ambulatory Visit (HOSPITAL_COMMUNITY)
Admission: RE | Admit: 2011-02-01 | Discharge: 2011-02-01 | Disposition: A | Discharge status: Home / Self Care | Payer: BC Managed Care – PPO | Source: Ambulatory Visit | Attending: Family Medicine | Admitting: Family Medicine

## 2011-02-01 NOTE — Progress Notes (Signed)
Physical Therapy Treatment Patient Details  Name: [name] MRN: [id number] Date of Birth: [birth year]  Today's Date: 02/01/2011 Time: 1478-2956 Time Calculation (min): 66 min Visit#: 12  of 24   Re-eval: 02/20/11 Charges: Therex 32' Manual 8' Ice x 10'  Subjective: Symptoms/Limitations Symptoms: My pain is not too bad today. I went to the doctor and he said I was still weak. He gave me step ups to do at home. Pain Assessment Currently in Pain?: Yes Pain Score:   5 Pain Location: Knee Pain Orientation: Left   Exercise/Treatments Aerobic Stationary Bike: 6' @ 3.0 seat 9 Machines for Strengthening Cybex Knee Extension: 2 PL 2x 10 Cybex Knee Flexion: 4.5 PL 2x 10 Standing Knee Flexion: 15 reps;Limitations Knee Flexion Limitations: terminal flex on 4" step Terminal Knee Extension: 15 reps;Theraband Theraband Level (Terminal Knee Extension): Level 4 (Blue) Terminal Knee Extension Limitations: 5 second holds Lateral Step Up: Left;15 reps;Step Height: 4" Forward Step Up: Left;15 reps;Step Height: 4" Step Down: Left;15 reps;Step Height: 2" Functional Squat: 20 reps Lunge Walking - Round Trips: 1RT SLS: 1' Sidelying Hip ABduction: Left;15 reps;Limitations Hip ABduction Limitations: 4# Prone  Hamstring Curl: 15 reps;Limitations Hamstring Curl Limitations: 4# Hip Extension: 15 reps Contract/Relax to Increase Flexion: 4#   Modalities Modalities: Cryotherapy Manual Therapy Manual Therapy: Myofascial release Myofascial Release: MFR to distal scar and distal ITB (left) x8' Cryotherapy Number Minutes Cryotherapy: 10 Minutes Cryotherapy Location: Knee (left) Type of Cryotherapy: Ice pack  Physical Therapy Assessment and Plan PT Assessment and Plan Clinical Impression Statement: Pt ambulates into therapy without AD.Began sidelying hip add to increase adductor L strength. Tightness and multiple adhesions/spasms noted in L distal ITB which decreased after manual  therapy. Pt reports pain decrease to 3/10 at end of session. PT Treatment/Interventions: Therapeutic exercise;Other (comment) (Manual; Ice) PT Plan: Continue to progress per PT POC.     Problem List Patient Active Problem List  Diagnoses  . S/P total knee replacement  . Knee pain  . Knee stiffness    PT - End of Session Activity Tolerance: Patient tolerated treatment well General Behavior During Session: [facility] for tasks performed Cognition: [facility] for tasks performed  [name] 02/01/2011, 9:23 AM

## 2011-02-02 MED ORDER — LIDOCAINE HCL (PF) 1 % IJ SOLN
INTRAMUSCULAR | Status: AC
  Filled 2011-02-02: qty 5

## 2011-02-02 MED ORDER — FENTANYL CITRATE 0.05 MG/ML IJ SOLN
INTRAMUSCULAR | Status: AC
  Filled 2011-02-02: qty 2

## 2011-02-02 MED ORDER — PROPOFOL 10 MG/ML IV EMUL
INTRAVENOUS | Status: AC
  Filled 2011-02-02: qty 20

## 2011-02-02 MED ORDER — NEOSTIGMINE METHYLSULFATE 1 MG/ML IJ SOLN
INTRAMUSCULAR | Status: AC
  Filled 2011-02-02: qty 10

## 2011-02-02 MED ORDER — MIDAZOLAM HCL 2 MG/2ML IJ SOLN
INTRAMUSCULAR | Status: AC
  Filled 2011-02-02: qty 2

## 2011-02-02 MED ORDER — BUPIVACAINE IN DEXTROSE 0.75-8.25 % IT SOLN
INTRATHECAL | Status: AC
  Filled 2011-02-02: qty 2

## 2011-02-03 ENCOUNTER — Ambulatory Visit (HOSPITAL_COMMUNITY)
Admission: RE | Admit: 2011-02-03 | Discharge: 2011-02-03 | Disposition: A | Discharge status: Home / Self Care | Payer: BC Managed Care – PPO | Source: Ambulatory Visit | Attending: Family Medicine | Admitting: Family Medicine

## 2011-02-03 NOTE — Progress Notes (Signed)
Physical Therapy Treatment Patient Details  Name: DANIELL PARADISE MRN: [id number] Date of Birth: Jul 21, 1955  Today's Date: 02/03/2011 Time: 4098-1191 Time Calculation (min): 70 min Visit#: 13  of 24   Re-eval: 02/20/11  Charge:  There ex 40; IP 1;   Subjective: Symptoms/Limitations Symptoms: Pt states she is doing better today. Pain Assessment Pain Score:   5 Pain Location: Knee Pain Orientation: Left            Exercise/Treatments Stretches Active Hamstring Stretch: 3 reps;30 seconds Quad Stretch: 3 reps;30 seconds Gastroc Stretch:  (HEP) Soleus Stretch:  (HEP) Aerobic Stationary Bike: 6' @ 3.0 seat 9 Machines for Strengthening Cybex Knee Extension: 2 PL 2x 10 Cybex Knee Flexion: 4.5 PL 2x 10 Plyometrics   Standing Knee Flexion: 15 reps;Limitations;Left;Strengthening Knee Flexion Limitations: terminal flex on 6" then do FROM with 3# Terminal Knee Extension: 15 reps;Theraband Theraband Level (Terminal Knee Extension): Level 4 (Blue) Terminal Knee Extension Limitations: 5 second holds SLS: 2' (2') Seated Long Arc Quad: Strengthening;Left;Weights (3) Long Arc Quad Weight: 4 lbs. Heel Slides:  (HEP) Supine Quad Sets:  (HEP) Short Arc Quad Sets: Strengthening;Left;15 reps;Limitations Short Arc Quad Sets Limitations: 3# then 5# Sidelying Hip ABduction: Left;15 reps;Limitations Hip ABduction Limitations: 4# Prone  Hamstring Curl: 15 reps;Limitations Hamstring Curl Limitations: 4# Hip Extension: 15 reps;Left;Limitations Hip Extension Limitations: 4# Contract/Relax to Increase Flexion: 5x   Modalities Modalities: Cryotherapy Manual Therapy Manual Therapy: Other (comment) Other Manual Therapy: massage for edema x 5 min Cryotherapy Number Minutes Cryotherapy: 10 Minutes Cryotherapy Location: Knee Type of Cryotherapy: Ice pack  Physical Therapy Assessment and Plan PT Assessment and Plan Clinical Impression Statement: Worked on proper heel toe gait as  patient is walking with an antalgic gait. PT Treatment/Interventions: Therapeutic exercise PT Plan: Pt continues to progress in ROM and strength need to work on terminal extension strength.    Goals  normalized gait without assistive device.  Problem List Patient Active Problem List  Diagnoses  . S/P total knee replacement  . Knee pain  . Knee stiffness    PT - End of Session Activity Tolerance: Patient tolerated treatment well General Behavior During Session: [facility] for tasks performed  Yuniel Blaney,[name] 02/03/2011, 9:09 AM

## 2011-02-06 ENCOUNTER — Ambulatory Visit (HOSPITAL_COMMUNITY)
Admission: RE | Admit: 2011-02-06 | Discharge: 2011-02-06 | Disposition: A | Discharge status: Home / Self Care | Payer: BC Managed Care – PPO | Source: Ambulatory Visit | Attending: Family Medicine | Admitting: Family Medicine

## 2011-02-06 NOTE — Progress Notes (Signed)
Physical Therapy Treatment Patient Details  Name: [name] MRN: [id number] Date of Birth: 07-03-1955  Today's Date: 02/06/2011 Time: 0802-0850 Time Calculation (min): 48 min Visit#: 14  of 24   Re-eval: 02/20/11  Charge: therex 34 min Manual 8 min  Subjective: Pt stated knee painful over weekend,, achey this morning thinks its because of the weather. Pain scale 6/10 today     Objective:   Exercise/Treatments Aerobic  Stationary Bike: 6' @ 3.0 seat 9  Machines for Strengthening  Cybex Knee Extension: 2.5 PL 2x 10  Cybex Knee Flexion: 5 PL 2x 10  Standing  Knee Flexion2 sets; 10 reps;Limitations;Left;Strengthening  Knee Flexion Limitations: terminal flex on 6"  Terminal Knee Extension: 2 sets; 10 reps;Theraband  Theraband Level (Terminal Knee Extension): Level 4 (Blue)  Terminal Knee Extension Limitations: 5-10 second holds  SLS: 44" max of 3  Supine  Active Hamstring Stretch: 3 reps;30 seconds  PROM knee extension 3x30" Prone  Quad Stretch: 3 reps;30 seconds  Hip Extension: 2 sets; 10 reps  Hip Extension Limitations: 4#  PROM knee flexion 3x 30"  Contract/Relax to Increase Flexion: 5x  Manual Therapy Manual Therapy: Other (comment) Other Manual Therapy: Retro massage to reduce edema  Physical Therapy Assessment and Plan PT Assessment and Plan Clinical Impression Statement: Pt tolerated treatment well with increased weight and reps without difficutly.  Pt continues to require vc for heel to toe gait to reduce antalgic gait.   PT Plan: Continue progressing ROM and terminal extension strength.    Goals    Problem List Patient Active Problem List  Diagnoses  . S/P total knee replacement  . Knee pain  . Knee stiffness    PT - End of Session Activity Tolerance: Patient tolerated treatment well General Behavior During Session: [facility] for tasks performed Cognition: [facility] for tasks performed  Juel Burrow 02/06/2011, 12:09 PM

## 2011-02-07 MED ORDER — EPINEPHRINE HCL 1 MG/ML IJ SOLN
INTRAMUSCULAR | Status: AC
  Filled 2011-02-07: qty 1

## 2011-02-08 ENCOUNTER — Ambulatory Visit (HOSPITAL_COMMUNITY)
Admission: RE | Admit: 2011-02-08 | Discharge: 2011-02-08 | Disposition: A | Discharge status: Home / Self Care | Payer: BC Managed Care – PPO | Source: Ambulatory Visit | Attending: Family Medicine | Admitting: Family Medicine

## 2011-02-08 NOTE — Progress Notes (Signed)
Physical Therapy Treatment Patient Details  Name: [name] MRN: [id number] Date of Birth: 1956/04/12  Today's Date: 02/08/2011 Time: 0802-0900 Time Calculation (min): 58 min Visit#: 15  of 24   Re-eval: 02/20/11  Charge: therex 42 Ice 10  Subjective: Symptoms/Limitations Symptoms: My knee bothered me last night, I stayed up all night stretching and doing exercises, it feels better now, pain scale 4/10 now. Pain Assessment Currently in Pain?: Yes Pain Score:   4 Pain Location: Knee Pain Orientation: Left  Precautions/Restrictions     Mobility (including Balance)       Exercise/Treatments Stretches Active Hamstring Stretch: 3 reps;30 seconds Quad Stretch: 30 seconds;4 reps Aerobic Stationary Bike: 6' @ 4.0 seat 9 Machines for Strengthening Cybex Knee Extension: 2.5 PL 2x 10 Cybex Knee Flexion: 5 PL 2x 10 Standing Knee Flexion: 2 sets;10 reps;Limitations Knee Flexion Limitations: terminal knee flexion with 3# on 6" box Terminal Knee Extension: 20 reps;Theraband;Limitations Theraband Level (Terminal Knee Extension): Level 4 (Blue) Terminal Knee Extension Limitations: 5 second holds Lateral Step Up: 2 sets;10 reps;Step Height: 4" Forward Step Up: 2 sets;10 reps;Step Height: 4" Step Down: 2 sets;10 reps;Step Height: 4" Functional Squat: 20 reps Stairs: 1 RT Gait Training: gait without AD Supine Knee Extension: PROM;3 sets;Limitations Knee Extension Limitations: 30" holds for extension Sidelying Hip ABduction: 2 sets;10 reps;Limitations Hip ABduction Limitations: 4# Prone  Hip Extension: 2 sets;10 reps;Limitations Hip Extension Limitations: 4# Contract/Relax to Increase Flexion: 5x     Physical Therapy Assessment and Plan PT Assessment and Plan Clinical Impression Statement: Pt expressed personal goal of amb without AD thoughout community, session complete with no SPC, pt presented proper heel to toe gait, equal stride length good posture and no LOB  episodes.  Began stairwell with weak eccentric quad control presented descending steps. PT Plan: Continue with current POC, progress ROM and functional strength.    Goals    Problem List Patient Active Problem List  Diagnoses  . S/P total knee replacement  . Knee pain  . Knee stiffness    PT - End of Session Activity Tolerance: Patient tolerated treatment well General Behavior During Session: [facility] for tasks performed Cognition: [facility] for tasks performed  Juel Burrow 02/08/2011, 9:03 AM

## 2011-02-10 ENCOUNTER — Ambulatory Visit (HOSPITAL_COMMUNITY)
Admission: RE | Admit: 2011-02-10 | Discharge: 2011-02-10 | Disposition: A | Discharge status: Home / Self Care | Payer: BC Managed Care – PPO | Source: Ambulatory Visit | Attending: Family Medicine | Admitting: Family Medicine

## 2011-02-10 NOTE — Progress Notes (Signed)
Physical Therapy Treatment Patient Details  Name: [name] MRN: [id number] Date of Birth: 05-06-1955  Today's Date: 02/10/2011 Time: 7829-5621 Time Calculation (min): 62 min Visit#: 16  of 24   Re-eval: 02/20/11 Charges: Therex x 30' Manual x 8' Ice x 10'  Subjective: Symptoms/Limitations Symptoms: I'm a pretty sore because I did a lot of walking outside yesterday. I had to take a pain pill this morning. Pain Assessment Pain Score:   6 Pain Location: Knee Pain Orientation: Left  Exercise/Treatments Aerobic Stationary Bike: 6' @ 4.0 seat 9 Machines for Strengthening Cybex Knee Extension: 2.5 PL 2x 10 Cybex Knee Flexion: 5 PL 2x 10 Standing Knee Flexion: 2 sets;10 reps;Limitations Knee Flexion Limitations: terminal knee flexion with 3# on 6" box Lateral Step Up: 2 sets;10 reps;Step Height: 4" Forward Step Up: 2 sets;10 reps;Step Height: 4" Step Down: 2 sets;10 reps;Step Height: 4" Functional Squat: 20 reps Stairs: 1 RT Gait Training: gait without AD Supine Knee Extension Limitations: Not needed Sidelying Hip ABduction: 2 sets;10 reps;Limitations Hip ABduction Limitations: 4# Hip ADduction: 10 reps;Left Prone  Hip Extension: 2 sets;10 reps;Limitations Hip Extension Limitations: 4#   Modalities Modalities: Cryotherapy Manual Therapy Manual Therapy: Myofascial release Myofascial Release: MFR to distal scar and distal ITB (left) x8'  Cryotherapy Number Minutes Cryotherapy: 10 Minutes Cryotherapy Location: Knee (Left) Type of Cryotherapy: Ice pack  Physical Therapy Assessment and Plan PT Assessment and Plan Clinical Impression Statement: Pt amb during therapy without AD with minimal difficulty. Pt with multiple spasms/adhesions in left distal ITB. Pt continues to display decreased eccentric quad control and adductor strength with stairs. Pt with decreased pain at end of session. PT Treatment/Interventions: Therapeutic exercise;Other (comment) (Manual;  Ice) PT Plan: Continue to progress per PT POC.     Problem List Patient Active Problem List  Diagnoses  . S/P total knee replacement  . Knee pain  . Knee stiffness    PT - End of Session Activity Tolerance: Patient tolerated treatment well General Behavior During Session: [facility] for tasks performed Cognition: [facility] for tasks performed  [name] 02/10/2011, 8:58 AM

## 2011-02-13 ENCOUNTER — Ambulatory Visit (HOSPITAL_COMMUNITY)
Admission: RE | Admit: 2011-02-13 | Discharge: 2011-02-13 | Disposition: A | Discharge status: Home / Self Care | Payer: BC Managed Care – PPO | Source: Ambulatory Visit | Attending: Family Medicine | Admitting: Family Medicine

## 2011-02-13 NOTE — Progress Notes (Signed)
Physical Therapy Treatment Patient Details  Name: [name] MRN: [id number] Date of Birth: 27-Nov-1955  Today's Date: 02/13/2011 Time: 0806-0905 Time Calculation (min): 59 min Visit#: 17  of 24   Re-eval: 02/20/11 Charges:  therex 35', manual 8', ice 10'    Subjective: Symptoms/Limitations Symptoms: Pt. states she is sore today.  Pt. reports 5/10 pain. Pain Assessment Currently in Pain?: Yes Pain Score:   5 Pain Location: Knee  Exercise/Treatments Aerobic Stationary Bike: 6' @ 4.0 seat 9 Machines for Strengthening Cybex Knee Extension: 2.5 PL 2x 10 Cybex Knee Flexion: 5 PL 2x 10 Standing Knee Flexion: 2 sets;10 reps;Limitations Knee Flexion Limitations: terminal knee flexion with 3# on 6" box Terminal Knee Extension: 20 reps;Theraband;Limitations Theraband Level (Terminal Knee Extension): Level 4 (Blue) Terminal Knee Extension Limitations: 5 second holds Lateral Step Up: 2 sets;10 reps;Step Height: 4" Forward Step Up: 2 sets;10 reps;Step Height: 4" Step Down: 2 sets;10 reps;Step Height: 4" Functional Squat: 20 reps   Modalities Modalities: Cryotherapy Manual Therapy Manual Therapy: Myofascial release Myofascial Release: MFR to distal scar and distal ITB L knee 8' Cryotherapy Number Minutes Cryotherapy: 10 Minutes Cryotherapy Location: Knee Type of Cryotherapy: Ice pack  Physical Therapy Assessment and Plan PT Assessment and Plan Clinical Impression Statement: Pt. working on steps at home and is compliant with HEP.  Tightness decreased with manual therapy but with more on lateral knee as compared to medial. PT Plan: Continue per POC.     Problem List Patient Active Problem List  Diagnoses  . S/P total knee replacement  . Knee pain  . Knee stiffness    PT - End of Session Activity Tolerance: Patient tolerated treatment well General Behavior During Session: [facility] for tasks performed Cognition: [facility] for tasks performed  Emeline Gins B 02/13/2011,  9:57 AM

## 2011-02-15 ENCOUNTER — Ambulatory Visit (HOSPITAL_COMMUNITY)
Admission: RE | Admit: 2011-02-15 | Discharge: 2011-02-15 | Disposition: A | Discharge status: Home / Self Care | Payer: BC Managed Care – PPO | Source: Ambulatory Visit

## 2011-02-15 NOTE — Progress Notes (Signed)
Physical Therapy Treatment Patient Details  Name: [name] MRN: [id number] Date of Birth: Sep 10, 1955  Today's Date: 02/15/2011 Time: 0802-0905 Time Calculation (min): 63 min Visit#: 18  of 24   Re-eval: 02/20/11  Charge: therex 37 min Manual 8 min Ice 10 min  Subjective: Symptoms/Limitations Symptoms: Pt states she is extra stiff today, reports pain 4/10.  Pt reports spasm on lateral hamstring, took spasm pill this am. Pain Assessment Currently in Pain?: Yes Pain Score:   4 Pain Location: Knee Pain Orientation: Left  Objective: amb into dept with no AD  Exercise/Treatments Stretches  Supine H/S st 3x30" Prone quad st 3x30" Aerobic Stationary Bike: 6' @ 4.0 seat 9 Machines for Strengthening Cybex Knee Extension: 2.5 PL 2x 10 Cybex Knee Flexion: 5 PL 2x 10 Standing Knee Flexion: 2 sets;10 reps;Limitations Knee Flexion Limitations: terminal knee flexion on 6" box Terminal Knee Extension: 20 reps;Theraband;Limitations Theraband Level (Terminal Knee Extension): Level 4 (Blue) Terminal Knee Extension Limitations: 5 second holds Lateral Step Up: Limitations Lateral Step Up Limitations: lateral step on stairwell Forward Step Up: Limitations Forward Step Up Limitations: forward step up on stairwell Step Down: Limitations Step Down Limitations: step down on stairwell Functional Squat: 20 reps Stairs: 1 RT Gait Training: gait outdoors working on incline/decline slopes  Manual Therapy Manual Therapy: Myofascial release Myofascial Release: MFR to distal scar and distal ITB L knee x 8' Cryotherapy Number Minutes Cryotherapy: 10 Minutes Cryotherapy Location: Knee Type of Cryotherapy: Ice pack  Physical Therapy Assessment and Plan PT Assessment and Plan Clinical Impression Statement: Began gait outdoors with difficulty controling incline and decline slopes, pt with decrease stance phase and shorted step length with L LE.  Steps completed in stairwell with weak  eccentric quad control presented descending stairs.  Pt tolerated well with total treatment, able to reduce spasms lateral L knee/ITB with MFR at end of session with pain relief stated. PT Plan: Continue with current POC.    Goals    Problem List Patient Active Problem List  Diagnoses  . S/P total knee replacement  . Knee pain  . Knee stiffness    PT - End of Session Activity Tolerance: Patient tolerated treatment well General Behavior During Session: [facility] for tasks performed Cognition: [facility] for tasks performed  Juel Burrow 02/15/2011, 9:47 AM

## 2011-02-17 ENCOUNTER — Ambulatory Visit (HOSPITAL_COMMUNITY)
Admission: RE | Admit: 2011-02-17 | Discharge: 2011-02-17 | Disposition: A | Discharge status: Home / Self Care | Payer: BC Managed Care – PPO | Source: Ambulatory Visit | Attending: Family Medicine | Admitting: Family Medicine

## 2011-02-17 NOTE — Progress Notes (Signed)
Physical Therapy Treatment Patient Details  Name: [name] MRN: [id number] Date of Birth: 24-Aug-1955  Today's Date: 02/17/2011 Time: 1191-4782 Time Calculation (min): 61 min Visit#: 19  of 24   Re-eval: 02/20/11  Charge: therex 35 min Manual 10 min Ice 10 min  Subjective: Symptoms/Limitations Symptoms: Pt states increased pain today, had to take a pain pill this am, pain scale 6/10.  My hamstring felt a lot better following massage last session, feel like it's back now. Pain Assessment Currently in Pain?: Yes Pain Score:   6 Pain Location: Knee Pain Orientation: Left  Objective:   Exercise/Treatments Stretches Active Hamstring Stretch: 3 reps;30 seconds Quad Stretch: 3 reps;30 seconds Aerobic Stationary Bike: 6' @ 4.0 seat 9 Machines for Strengthening Cybex Knee Extension: 2.5 PL 2x 10 Cybex Knee Flexion: 5 PL 2x 10 Standing Knee Flexion: 2 sets;10 reps;Limitations Knee Flexion Limitations: terminal knee flexion on 6" box, 5" holds Terminal Knee Extension: 20 reps;Theraband;Limitations Theraband Level (Terminal Knee Extension): Level 4 (Blue) Terminal Knee Extension Limitations: 5 second holds Lateral Step Up: Limitations Lateral Step Up Limitations: 2FL lateral step on stairwell Forward Step Up: Limitations Forward Step Up Limitations: 2FL forward step up on stairwell Step Down: Limitations Step Down Limitations: 2 FL step down on stairwell Wall Squat: 5 reps;5 seconds Stairs: 2RT  Modalities Modalities: Cryotherapy Manual Therapy Myofascial Release: MFR to distal biceps femoris and distal ITB with cross friction x 10' Cryotherapy Number Minutes Cryotherapy: 10 Minutes  Physical Therapy Assessment and Plan PT Assessment and Plan Clinical Impression Statement: Pt still presentes with weak eccentric quad control descending stairs.  Progressed functional squats to wall squats with 5" holds for increased quad strength, visible fatigue but able to  complete with no c/o.  Able to reduce spasms biceps femoris with pain relieved at end of session. PT Plan: Continue with current POC.    Goals    Problem List Patient Active Problem List  Diagnoses  . S/P total knee replacement  . Knee pain  . Knee stiffness    PT - End of Session Activity Tolerance: Patient tolerated treatment well General Behavior During Session: [facility] for tasks performed Cognition: [facility] for tasks performed  Juel Burrow 02/17/2011, 9:10 AM

## 2011-02-20 ENCOUNTER — Telehealth (HOSPITAL_COMMUNITY): Payer: Self-pay

## 2011-02-20 ENCOUNTER — Ambulatory Visit (HOSPITAL_COMMUNITY): Payer: BC Managed Care – PPO | Admitting: Physical Therapy

## 2011-02-22 ENCOUNTER — Ambulatory Visit (HOSPITAL_COMMUNITY)
Admission: RE | Admit: 2011-02-22 | Discharge: 2011-02-22 | Disposition: A | Discharge status: Home / Self Care | Payer: BC Managed Care – PPO | Source: Ambulatory Visit | Attending: Family Medicine | Admitting: Family Medicine

## 2011-02-22 NOTE — Progress Notes (Signed)
Physical Therapy Treatment Patient Details  Name: [name] MRN: [id number] Date of Birth: 1955/07/12  Today's Date: 02/22/2011 Time: 0802-0900 Time Calculation (min): 58 min Visit#: 20  of 24   Re-eval: 02/20/11  Charge: therex 34 min Manual 8 min Ice 10 min  Subjective: Symptoms/Limitations Symptoms: Pt reported knee feels a little strained, stated on Sunday was walking backwards and foot got caught on rug almost fell daughter caught me, pain scale 5/10. Pain Assessment Currently in Pain?: Yes Pain Score:   5 Pain Location: Knee Pain Orientation: Left  Objective:  L knee AROM 0-112, PROM 0-118  Exercise/Treatments Stretches Active Hamstring Stretch: 3 reps;30 seconds Quad Stretch: 3 reps;30 seconds Aerobic Stationary Bike: 6' @ 4.0 seat 9 Machines for Strengthening Cybex Knee Extension: 2.5 PL 2x 10 Cybex Knee Flexion: 5 PL 2x 10 Standing Knee Flexion: 2 sets;10 reps;Limitations Knee Flexion Limitations: terminal knee flexion on 6" box, 5" holds, with 3# Lateral Step Up: Limitations Lateral Step Up Limitations: 2FL lateral step on stairwell Forward Step Up: Limitations Forward Step Up Limitations: 2FL forward step up on stairwell Step Down: Limitations Step Down Limitations: 2 FL step down on stairwell Wall Squat: 15 reps;5 seconds Stairs: 2RT Supine Heel Slides: 5 reps Knee Flexion: PROM;1 set;Limitations Knee Flexion Limitations: 30" Prone  Hamstring Curl: 15 reps;Limitations Hamstring Curl Limitations: Terminal knee flexion  Hip Extension: 15 reps;Limitations Hip Extension Limitations: 3#    Physical Therapy Assessment and Plan PT Assessment and Plan Clinical Impression Statement: Pt with less stance phase on L LE, vc to equalize stance.  Pt able to demonstrate with appropriate gait following cueing.  Pt still presents with weak eccentric quad control descending and lateral step up in stairwell.  AROM improving, 0-112, PROM 0-118 today.  Palpated  less spasms at biceps femoris PT Plan: Reassess next session.    Goals PT Short Term Goals PT Short Term Goal 1 - Progress: Met PT Short Term Goal 2 - Progress: Not met PT Short Term Goal 3 - Progress: Met PT Short Term Goal 4 - Progress: Met PT Long Term Goals PT Long Term Goal 1 - Progress: Progressing toward goal PT Long Term Goal 2 - Progress: Not met Long Term Goal 3 Progress: Not met Long Term Goal 4 Progress: Not met  Problem List Patient Active Problem List  Diagnoses  . S/P total knee replacement  . Knee pain  . Knee stiffness    PT - End of Session Activity Tolerance: Patient tolerated treatment well General Behavior During Session: [facility] for tasks performed Cognition: [facility] for tasks performed  Juel Burrow 02/22/2011, 9:19 AM

## 2011-02-24 ENCOUNTER — Ambulatory Visit (HOSPITAL_COMMUNITY)
Admission: RE | Admit: 2011-02-24 | Discharge: 2011-02-24 | Disposition: A | Discharge status: Home / Self Care | Payer: BC Managed Care – PPO | Source: Ambulatory Visit | Attending: Family Medicine | Admitting: Family Medicine

## 2011-02-24 NOTE — Progress Notes (Signed)
Physical Therapy Treatment Patient Details  Name: [name] MRN: [id number] Date of Birth: [birth year]  Today's Date: 02/24/2011 Time: 4098-1191 Time Calculation (min): 45 min Visit#: 21  of 30   Re-eval: 03/10/11  Charge: MMT  1 unit ROM Measurement 1 Unit Therex 29 min  Subjective: Symptoms/Limitations Symptoms: I've been practicing stairs at home so sore today, pain scale 5/10. Pain Assessment Currently in Pain?: Yes Pain Score:   5 Pain Location: Knee Pain Orientation: Left  Objective:  [name] reeval 02/24/2011  Motion L LE Previous reeval on 01/25/2011 Current reeval 02/24/2011  L hip flexion 3+/5 4+/5  Glut med 3+/5 4/5  Adduction 3-/5 3/5  Quad 4/5 4/5  Hamstring 3+/5 4/5  Glut max 3+/5 3+/5  AROM 0-118 degrees     Exercise/Treatments Stretches Active Hamstring Stretch: 3 reps;30 seconds Quad Stretch: 3 reps;30 seconds Aerobic Stationary Bike: 6' @ 4.0 seat 9 Standing Lateral Step Up: 20 reps;Step Height: 4" Forward Step Up: Limitations Forward Step Up Limitations: 3 FL forward step up Step Down: Limitations Step Down Limitations: 3 FL stairwell step down Stairs: 3 RT Supine Heel Slides: 20 reps;Limitations Heel Slides Limitations: prior ROM Measurement Knee Flexion: PROM;1 set;Limitations Knee Flexion Limitations: 30"  Physical Therapy Assessment and Plan PT Assessment and Plan Clinical Impression Statement: Reassessment complete with all STGs met, 1 LTG met, other LTGs progressing towards goals.  Overall strength improving, AROM 0-118, LEFS 32/80 with extreme difficulty walking for long distances or with fast cadence, moderate difficulty with light household activiites, standing or sitting for long  durations, little difficutly with putting on socks/shoes, getting in /out of car and rolling over in bed, no difficulty ambulating short distances.  Pt improving but still shows weak eccentric control descending and lateral step up with  stairs. PT Plan: Continue with current POC for 2 more weeks to increase functional strength and ROM, add weight with supine, prone and sidelying activities with main focus on hip extension, abd and adduction, begin timed sit to stand for quad strengthening.    Goals PT Short Term Goals PT Short Term Goal 1 - Progress: Met PT Short Term Goal 2 - Progress: Met PT Short Term Goal 3 - Progress: Met PT Short Term Goal 4 - Progress: Met PT Long Term Goals PT Long Term Goal 1 - Progress: Progressing toward goal PT Long Term Goal 2 - Progress: Progressing toward goal Long Term Goal 3 Progress: Not met Long Term Goal 4 Progress: Met  Problem List Patient Active Problem List  Diagnoses  . S/P total knee replacement  . Knee pain  . Knee stiffness    PT - End of Session Activity Tolerance: Patient tolerated treatment well General Behavior During Session: [facility] for tasks performed Cognition: [facility] for tasks performed  Juel Burrow 02/24/2011, 9:37 AM

## 2011-02-27 ENCOUNTER — Ambulatory Visit (HOSPITAL_COMMUNITY)
Admission: RE | Admit: 2011-02-27 | Discharge: 2011-02-27 | Disposition: A | Discharge status: Home / Self Care | Payer: BC Managed Care – PPO | Source: Ambulatory Visit | Attending: Family Medicine | Admitting: Family Medicine

## 2011-02-27 NOTE — Progress Notes (Signed)
Physical Therapy Treatment Patient Details  Name: [name] MRN: [id number] Date of Birth: June 21, 1955  Today's Date: 02/27/2011 Time: 0800-0900 Time Calculation (min): 60 min Visit#: 22  of 30   Re-eval: 03/10/11  Charge: therex 44 min Ice 10 min  Subjective: Symptoms/Limitations Symptoms: Feel like I over did it with my exercises this weekend, plus the weather outside had to take pain meds this morning, pain scale 5/10. Pain Assessment Currently in Pain?: Yes Pain Score:   5 Pain Location: Knee Pain Orientation: Left  Objective:   Exercise/Treatments Stretches Active Hamstring Stretch: 3 reps;30 seconds Quad Stretch: 3 reps;30 seconds Aerobic Stationary Bike: 6' @ 4.0 seat 9 Machines for Strengthening Cybex Knee Extension: 1 PL 2x 10 Cybex Knee Flexion: 3PL 2x 10 Standing Knee Flexion: 2 sets;10 reps;Limitations Knee Flexion Limitations: terminal knee flexion on 6" box, 5" holds, with 3# Terminal Knee Extension: 20 reps;Theraband;Limitations Theraband Level (Terminal Knee Extension): Level 4 (Blue) Terminal Knee Extension Limitations: 5 second holds Lateral Step Up: 20 reps;Step Height: 4" Forward Step Up: 20 reps;Step Height: 4" Step Down: 20 reps;Step Height: 4" Wall Squat: 15 reps;5 seconds Lunge Walking - Round Trips: 2RT Stairs: 3 RT Seated Other Seated Knee Exercises: 3 sets with 5 STS with 5 complete in 9.9 seconds Sidelying Hip ADduction: 2 sets;10 reps;Left Prone  Hamstring Curl: 20 reps;Limitations Hamstring Curl Limitations: Terminal knee flexion with 3# Hip Extension: 20 reps;Limitations Hip Extension Limitations: 3#      Physical Therapy Assessment and Plan PT Assessment and Plan Clinical Impression Statement: Pt overall quad strength/control improving with eccentric contol descending stairs, able to complete 5 STS without UE A in 9.9 seconds.  Included forward lunge walking for stability, complete without diff.  Cybex  machine  performed with L LE only, had to reduce weight but able to complete. PT Plan: Continue with current POC, focus on functional strength increase weight as tolerated.    Goals    Problem List Patient Active Problem List  Diagnoses  . S/P total knee replacement  . Knee pain  . Knee stiffness    PT - End of Session Activity Tolerance: Patient tolerated treatment well General Behavior During Session: [facility] for tasks performed Cognition: [facility] for tasks performed  Juel Burrow 02/27/2011, 9:09 AM

## 2011-03-01 ENCOUNTER — Ambulatory Visit (HOSPITAL_COMMUNITY)
Admission: RE | Admit: 2011-03-01 | Discharge: 2011-03-01 | Disposition: A | Discharge status: Home / Self Care | Payer: BC Managed Care – PPO | Source: Ambulatory Visit | Attending: Family Medicine | Admitting: Family Medicine

## 2011-03-01 NOTE — Progress Notes (Signed)
Physical Therapy Treatment Patient Details  Name: [name] MRN: [id number] Date of Birth: 04/11/1956  Today's Date: 03/01/2011 Time: 0803-0901 Time Calculation (min): 58 min Visit#: 23  of 30   Re-eval: 03/10/11 Charges: Therex x 40'  Subjective: Symptoms/Limitations Symptoms: Pt reprots that she stood for 2 1/2 hrs yesterday so she is sore this morning. Pain Assessment Currently in Pain?: Yes Pain Score:   7 Pain Location: Knee Pain Orientation: Left  Exercise/Treatments Aerobic Stationary Bike: 6' @ 4.0 seat 9 Machines for Strengthening Cybex Knee Extension: 1 PL 2x 10 Cybex Knee Flexion: 3PL 2x 10 Standing Knee Flexion: 2 sets;10 reps;Limitations Knee Flexion Limitations: terminal knee flexion on 6" box, 5" holds, with 3# Lateral Step Up: 20 reps;Step Height: 4" Forward Step Up: 20 reps;Step Height: 4" Step Down: 20 reps;Step Height: 4" Wall Squat: 15 reps;5 seconds Lunge Walking - Round Trips: 1RT (1RT only secondary to increased pain) Sidelying Hip ADduction: 2 sets;10 reps;Left Prone  Hamstring Curl: 20 reps;Limitations Hamstring Curl Limitations: Terminal knee flexion with 3# Hip Extension: 20 reps;Limitations Hip Extension Limitations: 3#   Physical Therapy Assessment and Plan PT Assessment and Plan Clinical Impression Statement: No increases in therex this session secondary to increased pain. Pt displays adductor weakness with stair descent and  SL hip add. Pt only able to complete 1RT of lunges secondary to increased pain. PT Plan: Continue to progress per PT POC. Add adductor squeeze with ball to wall squats next session.     Problem List Patient Active Problem List  Diagnoses  . S/P total knee replacement  . Knee pain  . Knee stiffness    PT - End of Session Activity Tolerance: Patient tolerated treatment well General Behavior During Session: [facility] for tasks performed Cognition: [facility] for tasks performed  [name] 03/01/2011, 8:54 AM

## 2011-03-02 ENCOUNTER — Ambulatory Visit (HOSPITAL_COMMUNITY)
Admission: RE | Admit: 2011-03-02 | Discharge: 2011-03-02 | Disposition: A | Discharge status: Home / Self Care | Payer: BC Managed Care – PPO | Source: Ambulatory Visit | Attending: Orthopedic Surgery | Admitting: Orthopedic Surgery

## 2011-03-02 DIAGNOSIS — M25569 Pain in unspecified knee: Secondary | ICD-10-CM | POA: Insufficient documentation

## 2011-03-02 DIAGNOSIS — R262 Difficulty in walking, not elsewhere classified: Secondary | ICD-10-CM | POA: Insufficient documentation

## 2011-03-02 DIAGNOSIS — IMO0001 Reserved for inherently not codable concepts without codable children: Secondary | ICD-10-CM | POA: Insufficient documentation

## 2011-03-02 DIAGNOSIS — M25669 Stiffness of unspecified knee, not elsewhere classified: Secondary | ICD-10-CM | POA: Insufficient documentation

## 2011-03-02 DIAGNOSIS — M6281 Muscle weakness (generalized): Secondary | ICD-10-CM | POA: Insufficient documentation

## 2011-03-02 NOTE — Progress Notes (Signed)
Physical Therapy Treatment Patient Details  Name: [name] MRN: [id number] Date of Birth: [birth year]  Today's Date: 03/02/2011 Time: 7829-5621 Time Calculation (min): 66 min Visit#: 24  of 30   Re-eval: 03/10/11    Subjective: Symptoms/Limitations Symptoms: Pt reports that she was up for 2 1/2 hours again aggrevating knee.  Told pt to time how long she can be on her knee without increased pain than stay at that level  three times a day.  Increase time as pt can tolerate. Pain Assessment Currently in Pain?: Yes Pain Score:   5 Pain Location: Knee Pain Orientation: Left Pain Type: Surgical pain     Exercise/Treatments  Aerobic Stationary Bike: 6' @ 4.0 seat 9 Tread Mill: 1.3 mph at 5' working on proper heel toe gt with equal stride lengths. Machines for Strengthening Cybex Knee Extension: 1PL 15 rep Cybex Knee Flexion: 3.5 pl 15 rep  Standing Knee Flexion: Strengthening;15 reps with 3# Lateral Step Up: 10 reps;Step Height: 6" Forward Step Up: 10 reps;Step Height: 6" Step Down: 10 reps;Step Height: 6" Wall Squat: 15 reps;Limitations Wall Squat Limitations: with ball squeeze Lunge Walking - Round Trips: 1RT Rocker Board: 2 minutes SLS: 40" max x 3  Seated Long Arc Quad: Strengthening;10 reps with 5# Supine Knee Flexion: PROM;1 set;Limitations Knee Flexion Limitations: 30" Other Supine Knee Exercises: PROM for flexion Sidelying Hip ADduction: 2 sets;10 reps with 3# Prone  Hamstring Curl: 20 reps with 5# Hip Extension: 20 reps with 5# Modalities Modalities: Cryotherapy Cryotherapy Number Minutes Cryotherapy: 10 Minutes Cryotherapy Location: Knee Type of Cryotherapy: Ice pack  Physical Therapy Assessment and Plan PT Assessment and Plan Clinical Impression Statement: Pt monitored for increased pain during therapy.  Pt continues to have slow progression with pain level explained to not try to hold out as long as she can at home rather work with her body in a  painfree environment. Rehab Potential: Good Clinical Impairments Affecting Rehab Potential: pain, decreased ROM Decreased strength. PT Frequency: Min 3X/week PT Plan: continue to progress patient.    Goals  Pain-free in ADL's  Problem List Patient Active Problem List  Diagnoses  . S/P total knee replacement  . Knee pain  . Knee stiffness    PT - End of Session Activity Tolerance: Patient tolerated treatment well General Behavior During Session: [facility] for tasks performed Cognition: [facility] for tasks performed  Othell Jaime,[name] 03/02/2011, 11:17 AM

## 2011-03-06 ENCOUNTER — Ambulatory Visit (HOSPITAL_COMMUNITY)
Admission: RE | Admit: 2011-03-06 | Discharge: 2011-03-06 | Disposition: A | Discharge status: Home / Self Care | Payer: BC Managed Care – PPO | Source: Ambulatory Visit | Attending: Family Medicine | Admitting: Family Medicine

## 2011-03-06 NOTE — Progress Notes (Signed)
Physical Therapy Treatment Patient Details  Name: [name] MRN: [id number] Date of Birth: May 26, 1955  Today's Date: 03/06/2011 Time: 0800-0900 Time Calculation (min): 60 min Visit#: 25  of 30   Re-eval: 03/10/11 Charges: Therex x 40'  Subjective: Symptoms/Limitations Symptoms: Pt reports that pain kept her up all night last night. Pain Assessment Currently in Pain?: Yes Pain Score:   7 Pain Location: Knee Pain Orientation: Left   Exercise/Treatments Aerobic Stationary Bike: 6' @ 4.0 seat 9 Machines for Strengthening Cybex Knee Extension: 1PL 15 rep Cybex Knee Flexion: 3.5 pl 15 rep Standing Knee Flexion: Strengthening;15 reps;Limitations Knee Flexion Limitations: TKF on 4" block w/3# Lateral Step Up: 10 reps;Step Height: 4" (u/a to tolerate 6" secondary to pain) Forward Step Up: 10 reps;Step Height: 6" Step Down: 10 reps;Step Height: 6" Wall Squat: 10 reps Wall Squat Limitations: with ball squeeze Rocker Board: 2 minutes Supine Heel Slides: 10 reps Heel Slides Limitations: prior ROM Knee Flexion: PROM Sidelying Hip ADduction: 2 sets;10 reps;Left;Limitations Hip ADduction Limitations: 3# Prone  Hamstring Curl: 20 reps;Limitations Hamstring Curl Limitations: 5# Hip Extension: 20 reps Hip Extension Limitations: 3#   Modalities Modalities: Cryotherapy Cryotherapy Number Minutes Cryotherapy: 10 Minutes Cryotherapy Location: Knee (left) Type of Cryotherapy: Ice pack  Physical Therapy Assessment and Plan PT Assessment and Plan Clinical Impression Statement: No increasees in reps or wt this tx secondary to increased pain. Walking lunges held secondary to pain. Pt continues to display increases in ROM and strength. Pt reports pain decrease to 4/10 at end of session. PT Treatment/Interventions: Therapeutic exercise;Other (comment) PT Plan: Continue per PT POC.     Problem List Patient Active Problem List  Diagnoses  . S/P total knee replacement  .  Knee pain  . Knee stiffness    PT - End of Session Activity Tolerance: Patient tolerated treatment well General Behavior During Session: [facility] for tasks performed Cognition: [facility] for tasks performed  [name] 03/06/2011, 9:01 AM

## 2011-03-08 ENCOUNTER — Ambulatory Visit (HOSPITAL_COMMUNITY)
Admission: RE | Admit: 2011-03-08 | Discharge: 2011-03-08 | Disposition: A | Discharge status: Home / Self Care | Payer: BC Managed Care – PPO | Source: Ambulatory Visit | Attending: Family Medicine | Admitting: Family Medicine

## 2011-03-08 NOTE — Progress Notes (Signed)
Physical Therapy Treatment Patient Details  Name: PEARL BENTS MRN: [id number] Date of Birth: 1955-12-20  Today's Date: 03/08/2011 Time: 0804-0900 Time Calculation (min): 56 min Visit#: 26  of 30   Re-eval: 03/10/11  charge There ex 2; massage1; IP  Subjective: Symptoms/Limitations Symptoms: Patient states that she went to the MD yesterday who stated that she had inflamation around her knee.  Pt given anti-inflammatory and told to take it easy for the next few days. Pain Assessment Pain Score:   6 (with pain meds and anti-inflammatory meds.) Pain Location: Knee Pain Orientation: Left      Exercise/Treatments Stretches Active Hamstring Stretch: 3 reps;30 seconds Quad Stretch: 3 reps;30 seconds Aerobic Stationary Bike: 3:30 at seat 9; 3:30 seat 8 @ 4.0     Standing Knee Flexion: Strengthening;Left;Limitations Knee Flexion Limitations: on 4" step Terminal Knee Extension: 15 reps;Theraband Theraband Level (Terminal Knee Extension): Level 4 (Blue) SLS: 60" max Seated Long Arc Quad: Strengthening;Left;Weights Long Arc Quad Weight: 3 lbs. Supine Heel Slides: 10 reps Heel Slides Limitations: hold x 5 sec. Terminal Knee Extension: 15 reps Sidelying   Prone   quad stretch x 30 sec x 3   Manual Therapy Manual Therapy: Other (comment) (retro-massage to decrease swelling and edema.) Cryotherapy Number Minutes Cryotherapy: 10 Minutes Cryotherapy Location: Knee Type of Cryotherapy: Ice pack  Physical Therapy Assessment and Plan PT Assessment and Plan Rehab Potential: Good Clinical Impairments Affecting Rehab Potential: Pain is the most limiting factor at this time. PT Plan: re-assess next vist.    Goals    Problem List Patient Active Problem List  Diagnoses  . S/P total knee replacement  . Knee pain  . Knee stiffness    PT - End of Session Activity Tolerance: Patient tolerated treatment well General Behavior During Session: [facility] for tasks  performed Cognition: [facility] for tasks performed  Ladawna Walgren,[name] 03/08/2011, 9:25 AM

## 2011-03-08 NOTE — Patient Instructions (Addendum)
To stay pain free with home activities.

## 2011-03-10 ENCOUNTER — Ambulatory Visit (HOSPITAL_COMMUNITY)
Admission: RE | Admit: 2011-03-10 | Discharge: 2011-03-10 | Disposition: A | Discharge status: Home / Self Care | Payer: BC Managed Care – PPO | Source: Ambulatory Visit | Attending: Family Medicine | Admitting: Family Medicine

## 2011-03-10 NOTE — Progress Notes (Signed)
Physical Therapy re-assessment  Patient Details  Name: [name] MRN: [id number] Date of Birth: 28-Jul-1955  Today's Date: 03/10/2011 Time: 0804-0900 Time Calculation (min): 56 min Visit#: 28  of 29   Re-eval:    charge:  There ex 42, mmt, massage IP Past Medical History: No past medical history on file. Past Surgical History: No past surgical history on file.  Subjective Symptoms/Limitations Symptoms: Pt states that she is still having quite a bit of pain but she can tell it is not as bad and she is walking better. Pain Assessment Currently in Pain?: Yes Pain Score:   5 Pain Location: Knee Pain Orientation: Left Pain Type: Surgical pain     Objective: LLE AROM (degrees) Left Knee Extension 0-130: 0  Left Knee Flexion 0-140: 120 was 118 LLE Strength Left Hip Flexion: 4/5 was 4 Left Hip Extension: 4/5 was 3- Left Hip ABduction: 4/5 was 4 Left Hip ADduction: 3+/5 was 3- Left Knee Flexion: 5/5 was 4 Left Knee Extension: 4/5 was 4  Exercise/Treatments Stretches Active Hamstring Stretch: 3 reps;30 seconds Quad Stretch: 3 reps;30 seconds Aerobic Stationary Bike: 3:30 at seat 9; 3:30 seat 7 @ 4.0  Standing Knee Flexion: Strengthening;Left;Limitations Knee Flexion Limitations: on 4" step Terminal Knee Extension: 15 reps;Theraband Theraband Level (Terminal Knee Extension): Level 4 (Blue) SLS: 60" max SLS with Vectors: 3 x 10 " @ Seated Long Arc Quad: Strengthening;Left;Weights Long Arc Quad Weight: 3 lbs. Supine Heel Slides: 10 reps Heel Slides Limitations: hold x 5 sec. Terminal Knee Extension: 15 reps Knee Flexion: PROM Sidelying Hip ABduction: 15 reps;Left;Limitations Hip ABduction Limitations: 3 Prone  Hamstring Curl: 15 reps Hamstring Curl Limitations: 3# Hip Extension: Left;15 reps Hip Extension Limitations: 3# Contract/Relax to Increase Flexion: 5x  Manual Therapy Manual Therapy:  (retromassage to decrease swelling and  pain) Cryotherapy Cryotherapy Location: Knee Type of Cryotherapy: Ice pack  Physical Therapy Assessment and Plan PT Assessment and Plan Clinical Impression Statement: Pt improved with strength; ROM is wnl Clinical Impairments Affecting Rehab Potential: decreased strength and increased pain PT Plan: see two more visits.  Concentrate on knee extension, Ahip ext, abduction and adduction strength. Give HEP for these mm    Goals PT Short Term Goals PT Short Term Goal 1 - Progress: Met PT Short Term Goal 2 - Progress: Not met PT Short Term Goal 3 - Progress: Met PT Short Term Goal 4 - Progress: Met PT Long Term Goals PT Long Term Goal 1: ROM is wnl but pt still has an antalgic gait due to pain. PT Long Term Goal 1 - Progress: Partly met PT Long Term Goal 2: Pt able to be up more than an hour but is still having pain.  Strength is generally 4/5  PT Long Term Goal 2 - Progress: Partly met Long Term Goal 3 Progress: Not met Long Term Goal 4 Progress: Progressing toward goal  Problem List Patient Active Problem List  Diagnoses  . S/P total knee replacement  . Knee pain  . Knee stiffness        [name],[name] 03/10/2011, 10:45 AM  Physician Documentation Your signature is required to indicate approval of the treatment plan as stated above.  Please sign and either send electronically or make a copy of this report for your files and return this physician signed original.   Please mark one 1.__approve of plan  2. ___approve of plan with the following conditions.   ______________________________  _____________________ Physician Signature                                                                                                             Date

## 2011-03-14 ENCOUNTER — Ambulatory Visit (HOSPITAL_COMMUNITY)
Admission: RE | Admit: 2011-03-14 | Discharge: 2011-03-14 | Disposition: A | Discharge status: Home / Self Care | Payer: BC Managed Care – PPO | Source: Ambulatory Visit | Attending: Family Medicine | Admitting: Family Medicine

## 2011-03-14 NOTE — Progress Notes (Signed)
Physical Therapy Treatment Patient Details  Name: [name] MRN: [id number] Date of Birth: 09-Nov-1955  Today's Date: 03/14/2011 Time: 0826-0912 Time Calculation (min): 46 min Visit#: 29  of 29   Re-eval: Next session Next MD Visit: 04/04/2011 Charges: Therex x 38'  Subjective: Symptoms/Limitations Symptoms: Pt stated she had to take pain meds this morning, didn't have to yesterday.  She stated increased spasms on hamstrings, pain scale 6/10.  Session started by Becky Sax, PTA from 8:26- 9:00 Pain Assessment Pain Score:   6 Pain Location: Knee Pain Orientation: Left   Exercise/Treatments Aerobic Stationary Bike: 8' @ 5.0 seat 8 Standing Terminal Knee Extension: 20 reps;Theraband Theraband Level (Terminal Knee Extension): Level 4 (Blue) Terminal Knee Extension Limitations: 5 second holds Seated Long Arc Quad: Strengthening;Left;20 reps;Weights Long Arc Quad Weight: 3 lbs. Supine Short Arc Quad Sets: 20 reps;Limitations Short Arc Quad Sets Limitations: 3#, 5" holds Heel Slides: 10 reps Heel Slides Limitations: hold x 5 sec. Terminal Knee Extension: 20 reps;Limitations Terminal Knee Extension Limitations: 5" holds Knee Flexion: PROM Sidelying Hip ABduction: 15 reps;Left;Limitations Hip ABduction Limitations: 3 Hip ADduction: 15 reps Hip ADduction Limitations: 3# Prone  Hamstring Curl: 15 reps Hamstring Curl Limitations: 3# Hip Extension: Left;15 reps Hip Extension Limitations: 3#   Physical Therapy Assessment and Plan PT Assessment and Plan Clinical Impression Statement: Pt completed all theres with minimal difficulty. Unable to tolerate LAQ with 5# but completed TKE with 3# with good hold. Pt with increased difficulty with SL add compared to other hip exercises. PT Treatment/Interventions: Therapeutic exercise PT Plan: Continue x 1 more session per PT.     Problem List Patient Active Problem List  Diagnoses  . S/P total knee replacement  . Knee  pain  . Knee stiffness    PT - End of Session Activity Tolerance: Patient tolerated treatment well General Behavior During Session: [facility] for tasks performed Cognition: [facility] for tasks performed  [name] 03/14/2011, 9:19 AM

## 2011-03-15 ENCOUNTER — Other Ambulatory Visit: Payer: Self-pay | Admitting: Adult Health

## 2011-03-15 ENCOUNTER — Ambulatory Visit (HOSPITAL_COMMUNITY)
Admission: RE | Admit: 2011-03-15 | Discharge: 2011-03-15 | Disposition: A | Discharge status: Home / Self Care | Payer: BC Managed Care – PPO | Source: Ambulatory Visit | Attending: Physical Therapy | Admitting: Physical Therapy

## 2011-03-15 DIAGNOSIS — Z139 Encounter for screening, unspecified: Secondary | ICD-10-CM

## 2011-03-15 NOTE — Progress Notes (Signed)
Physical Therapy reassessment. Patient Details  Name: [name] MRN: [id number] Date of Birth: 1956-01-08  Today's Date: 03/15/2011 Time: 2952-8413 Time Calculation (min): 48 min Visit#: 30  of 30   Re-eval:   today    Past Medical History: No past medical history on file. Past Surgical History: No past surgical history on file.  Subjective Symptoms/Limitations Symptoms: Pt states that she is feeling better with her knee since she started the anti-inflammatory.  She states she is still having some pain and soreness.   Pain Assessment Currently in Pain?: Yes Pain Score:   5 (with pain meds) Pain Location: Knee Pain Orientation: Left  Precautions/Restrictions     Prior Functioning     Sensation/Coordination/Flexibility    Assessment LLE AROM (degrees) Left Knee Extension 0-130: 0  Left Knee Flexion 0-140: 120  LLE Strength Left Hip Flexion: 4/5 Left Hip Extension: 4/5 (Right is 4/5 as well) Left Hip ABduction:  (4+) Left Hip ADduction: 4/5 Left Knee Flexion: 5/5 Left Knee Extension:  (4+)  Exercise/Treatments   Standing Forward Lunges: 10 reps Side Lunges: 10 reps Seated Other Seated Knee Exercises: sit to stand x 10 with right in back x 5 with l   Sidelying Hip ABduction: 15 reps Hip ABduction Limitations: 4 Prone  Hip Extension: 15 reps   Physical Therapy Assessment and Plan PT Assessment and Plan PT Plan: Pt has progressed well.  She is able to continue exercises on her own to increase her functional strength.  No further skilled PT is needed at this time.  Discharge.    Goals Home Exercise Program Pt will Perform Home Exercise Program: Independently PT Short Term Goals PT Short Term Goal 1 - Progress: Met PT Short Term Goal 2 - Progress: Progressing toward goal PT Short Term Goal 3 - Progress: Met PT Short Term Goal 4 - Progress: Met PT Long Term Goals PT Long Term Goal 1 - Progress: Met PT Long Term Goal 2 - Progress: Met Long Term  Goal 3 Progress: Not met Long Term Goal 4 Progress: Progressing toward goal  Problem List Patient Active Problem List  Diagnoses  . S/P total knee replacement  . Knee pain  . Knee stiffness    General Behavior During Session: [facility] for tasks performed   RUSSELL,[name] 03/15/2011, 3:17 PM  Physician Documentation Your signature is required to indicate approval of the treatment plan as stated above.  Please sign and either send electronically or make a copy of this report for your files and return this physician signed original.   Please mark one 1.__approve of plan  2. ___approve of plan with the following conditions.   ______________________________                                                          _____________________ Physician Signature  Date  

## 2011-03-28 ENCOUNTER — Ambulatory Visit (HOSPITAL_COMMUNITY)
Admission: RE | Admit: 2011-03-28 | Discharge: 2011-03-28 | Disposition: A | Discharge status: Home / Self Care | Payer: BC Managed Care – PPO | Source: Ambulatory Visit | Attending: Adult Health | Admitting: Adult Health

## 2011-03-28 DIAGNOSIS — Z139 Encounter for screening, unspecified: Secondary | ICD-10-CM

## 2011-03-28 DIAGNOSIS — Z1231 Encounter for screening mammogram for malignant neoplasm of breast: Secondary | ICD-10-CM | POA: Insufficient documentation

## 2011-04-10 ENCOUNTER — Ambulatory Visit (HOSPITAL_COMMUNITY)
Admission: RE | Admit: 2011-04-10 | Discharge: 2011-04-10 | Disposition: A | Discharge status: Home / Self Care | Payer: BC Managed Care – PPO | Source: Ambulatory Visit | Attending: Orthopedic Surgery | Admitting: Orthopedic Surgery

## 2011-04-10 DIAGNOSIS — R262 Difficulty in walking, not elsewhere classified: Secondary | ICD-10-CM | POA: Insufficient documentation

## 2011-04-10 DIAGNOSIS — R29898 Other symptoms and signs involving the musculoskeletal system: Secondary | ICD-10-CM | POA: Insufficient documentation

## 2011-04-10 DIAGNOSIS — M25569 Pain in unspecified knee: Secondary | ICD-10-CM | POA: Insufficient documentation

## 2011-04-10 DIAGNOSIS — M25669 Stiffness of unspecified knee, not elsewhere classified: Secondary | ICD-10-CM | POA: Insufficient documentation

## 2011-04-10 DIAGNOSIS — IMO0001 Reserved for inherently not codable concepts without codable children: Secondary | ICD-10-CM | POA: Insufficient documentation

## 2011-04-10 DIAGNOSIS — R269 Unspecified abnormalities of gait and mobility: Secondary | ICD-10-CM | POA: Insufficient documentation

## 2011-04-10 DIAGNOSIS — M6281 Muscle weakness (generalized): Secondary | ICD-10-CM | POA: Insufficient documentation

## 2011-04-10 NOTE — Progress Notes (Signed)
Physical Therapy Evaluation  Patient Details  Name: [name] MRN: [id number] Date of Birth: [birth year]  Today's Date: 04/10/2011 Time: 7829-5621 Time Calculation (min): 47 min Visit#: 1  of 8   Re-eval: 05/10/11 Assessment Diagnosis: ablnormal gt Surgical Date: 11/24/10 Next MD Visit: 05/03/2010  Past Medical History: No past medical history on file. Past Surgical History: No past surgical history on file.  Subjective Symptoms/Limitations Symptoms: Ms. Deetz states that she was having pain in her back due to her antalgic gait secondary to her TKR.  The pt TKR was 11/24/2010.  The patient had an injection in her back last Tuesday and by Thursday she was pain free.  She is being referred to Pt to normalize her gait so her back pain does not return. How long can you sit comfortably?: The patient states that she is able to sit to watch a movie.  She may have to fidget a little bit every 15 minutes. How long can you stand comfortably?: The patient states that she is able to stand  to make a meal without difficulty. How long can you walk comfortably?: The patient went shopping last Friday and was able to shop for five hours without significant discomfort. Special Tests: The patient is taking a mm relaxer to get to sleep. 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 Leisure: Hobbies-yes (Comment) Comments: walk, gardening   Assessment LLE AROM (degrees) Left Knee Extension 0-130: 0  (0) Left Knee Flexion 0-140: 120  LLE Strength Left Hip Flexion:  (4+) Left Hip Extension: 3+/5 Left Hip ABduction:  (4+) Left Hip ADduction: 5/5 Left Knee Flexion: 5/5 Left Knee Extension: 3+/5  Exercise/Treatments  Aerobic Tread Mill:  (using gait trainer R 77.5 L 77;  use footfall next treatment.)       Supine Straight Leg Raises: 10 reps;Limitations Straight Leg Raises Limitations: Stab as in lumbar Sidelying Hip ABduction: Strengthening;10 reps;Limitations Hip ABduction Limitations: Stab as in lumbar Prone  Hip Extension: Left;Limitations Hip Extension Limitations: stab as if lumbar   Physical Therapy Assessment and Plan PT Assessment and Plan Clinical Impression Statement: Pt s/p TKR with good ROM; pt has some strength deficits and has gotten into a habit of an antalgic gait that is causing increased LBP.  Will see pt twice a week for four weeks to normalize gat pattern to take stress off LB PT Frequency: Min 2X/week PT Duration: 4 weeks PT Treatment/Interventions: Gait training;Therapeutic activities;Therapeutic exercise PT Plan: Continue with gait trainer and strengthening of knee extensors, hip abductors and extensors.    Goals Home Exercise Program Pt will Perform Home Exercise Program: Independently PT Short Term Goals PT Short Term Goal 5: AS of 04/10/11- Pt to ambulate with normalized gait on gait trainer to prevent back irritation. PT Long Term Goals Time to Complete Long Term Goals: 4 weeks PT Long Term Goal 5: as of 12/10 normal hip/knee strength Additional PT Long Term Goals?: Yes PT Long Term Goal 6: 04/10/11 Pt to be able to demonstrate normalized gait in day to day walking PT Long Term Goal 7: 12/10?12 Pt to have not back or knee pain  Problem List Patient Active Problem List  Diagnoses  . S/P total knee replacement  . Knee pain  . Knee stiffness  . Left leg weakness  .  Abnormal gait    PT - End of Session Activity Tolerance: Patient tolerated treatment well General Behavior During Session: [facility] for tasks performed Cognition: [facility] for tasks performed   RUSSELL,[name] 04/10/2011, 10:48 AM  Physician Documentation Your signature is required to indicate approval of the treatment plan as stated above.  Please sign  and either send electronically or make a copy of this report for your files and return this physician signed original.   Please mark one 1.__approve of plan  2. ___approve of plan with the following conditions.   ______________________________                                                          _____________________ Physician Signature                                                                                                             Date

## 2011-04-10 NOTE — Patient Instructions (Addendum)
HEP

## 2011-04-13 ENCOUNTER — Ambulatory Visit (HOSPITAL_COMMUNITY)
Admission: RE | Admit: 2011-04-13 | Discharge: 2011-04-13 | Disposition: A | Discharge status: Home / Self Care | Payer: BC Managed Care – PPO | Source: Ambulatory Visit | Attending: Orthopedic Surgery | Admitting: Orthopedic Surgery

## 2011-04-13 NOTE — Progress Notes (Signed)
Physical Therapy Treatment Patient Details  Name: [name] MRN: [id number] Date of Birth: [birth year]  Today's Date: 04/13/2011 Time: 6213-0865 Time Calculation (min): 40 min Visit#: 2  of 8   Re-eval: 05/09/10 Charges: Therex x 30' Gait x 8'  Subjective: Symptoms/Limitations Symptoms: My pain is not bad today. Pain Assessment Currently in Pain?: Yes Pain Score:   1 Pain Location: Knee Pain Orientation: Left   Exercise/Treatments Aerobic Tread Mill: Gait trainer R 77.5 L 77 x 8' step cycle 55 Machines for Strengthening Cybex Knee Extension: 1PL B LE 2 x 10 Standing Other Standing Knee Exercises: side step with tband 2 Other Standing Knee Exercises: retro gt with tband x 2RT Supine Bridges: 10 reps Straight Leg Raises: 10 reps;Limitations Straight Leg Raises Limitations: lumbar stab Sidelying Hip ABduction: Strengthening;10 reps;Limitations Hip ABduction Limitations: lumbar stab Prone  Hip Extension: 10 reps Hip Extension Limitations: lumbar stab  Physical Therapy Assessment and Plan PT Assessment and Plan Clinical Impression Statement: Tx focus on strengthening hip ext/abd and knee extensors. Pt completes therex with good form and with minimal need for cueing. Pt reports no increase in pain at end of sesion. PT Plan: Cotninue to progress strength per PT POC.     Problem List Patient Active Problem List  Diagnoses  . S/P total knee replacement  . Knee pain  . Knee stiffness  . Left leg weakness  . Abnormal gait    PT - End of Session Activity Tolerance: Patient tolerated treatment well General Behavior During Session: [facility] for tasks performed Cognition: [facility] for tasks performed  [name] 04/13/2011, 9:37 AM

## 2011-04-17 ENCOUNTER — Ambulatory Visit (HOSPITAL_COMMUNITY): Payer: BC Managed Care – PPO | Admitting: Physical Therapy

## 2011-04-20 ENCOUNTER — Ambulatory Visit (HOSPITAL_COMMUNITY)
Admission: RE | Admit: 2011-04-20 | Discharge: 2011-04-20 | Disposition: A | Discharge status: Home / Self Care | Payer: BC Managed Care – PPO | Source: Ambulatory Visit | Attending: Orthopedic Surgery | Admitting: Orthopedic Surgery

## 2011-04-20 DIAGNOSIS — R29898 Other symptoms and signs involving the musculoskeletal system: Secondary | ICD-10-CM

## 2011-04-20 NOTE — Progress Notes (Signed)
Physical Therapy Treatment Patient Details  Name: [name] MRN: [id number] Date of Birth: November 07, 1955  Today's Date: 04/20/2011 Time: 1010-1058 Time Calculation (min): 48 min Visit#: 3  of 8   Re-eval: 05/10/11  Charge: gait 8 min therex 40 min  Subjective: Symptoms/Limitations Symptoms: Pain free today, haven't taken pain meds for 2 weeks think I will when I return to work though, return to MD on 05/03/2010 think he will send me back to work following that appt. Pain Assessment Currently in Pain?: No/denies  Objective:   Exercise/Treatments Aerobic Tread Mill: Gait trainer R 77.5 L 77 x 8' step cycle 60 Machines for Strengthening Cybex Knee Extension: 1PL B LE extend, L quad eccentric control descending 2 x 10 Standing Forward Lunges: 10 reps Side Lunges: 10 reps Hip ADduction: 2 sets;10 reps Hip ADduction Limitations: with blue tband Wall Squat: 10 reps;10 seconds Stairs: 3 RT reciprocal with 1 HR intermittent Other Standing Knee Exercises: Hip extension with blue tband 2x 10 Seated Long Arc Quad: 2 sets;10 reps;Weights Long Arc Quad Weight: 3 lbs. Sidelying Hip ABduction: Strengthening;2 sets;10 reps Prone  Hip Extension: 2 sets;10 reps      Physical Therapy Assessment and Plan PT Assessment and Plan Clinical Impression Statement: Continued treatment focus on strengthening knee extensors, hip extension and abduction.  Pt able to complete therex wtih good form/tech through session.  Quad weakness noted with lunges for stability and wall squats.   PT Plan: Continue progressing current POC, strengtheneing weak musculature.     Goals    Problem List Patient Active Problem List  Diagnoses  . S/P total knee replacement  . Knee pain  . Knee stiffness  . Left leg weakness  . Abnormal gait    PT - End of Session Activity Tolerance: Patient tolerated treatment well General Behavior During Session: [facility] for tasks performed Cognition: [facility] for tasks  performed  Juel Burrow 04/20/2011, 11:03 AM

## 2011-04-24 ENCOUNTER — Ambulatory Visit (HOSPITAL_COMMUNITY): Payer: BC Managed Care – PPO | Admitting: Physical Therapy

## 2011-04-27 ENCOUNTER — Ambulatory Visit (HOSPITAL_COMMUNITY)
Admission: RE | Admit: 2011-04-27 | Discharge: 2011-04-27 | Disposition: A | Discharge status: Home / Self Care | Payer: BC Managed Care – PPO | Source: Ambulatory Visit | Attending: Physical Therapy | Admitting: Physical Therapy

## 2011-04-27 DIAGNOSIS — R29898 Other symptoms and signs involving the musculoskeletal system: Secondary | ICD-10-CM

## 2011-04-27 NOTE — Progress Notes (Signed)
Physical Therapy Treatment Patient Details  Name: [name] MRN: [id number] Date of Birth: [birth year]  Today's Date: 04/27/2011 Time: 0865-7846 Time Calculation (min): 50 min Visit#: 4  of 8   Re-eval: 05/10/11   Gt 9' there ex 40 Subjective: Symptoms/Limitations Symptoms: Pt states no pain   Exercise/Treatments Stretches Quad Stretch: 3 reps;30 seconds Aerobic Tread Mill: Gait trainer R 77.5 L 77 x 9' step cycle 60 Machines for Strengthening Cybex Knee Extension: 1PL B LE extend, L quad eccentric control descending 2 x 10 Plyometrics   Standing Forward Lunges: 10 reps Side Lunges: 10 reps Terminal Knee Extension: Left;15 reps Hip ADduction: 2 sets;10 reps Hip ADduction Limitations: with blue tband Wall Squat: 15 reps;10 seconds Other Standing Knee Exercises: Hip extension with blue tband 2x 10 Other Standing Knee Exercises: lunge 15x with 3# Seated Long Arc Quad: 2 sets;10 reps;Weights Long Arc Quad Weight: 3 lbs. Supine Short Arc Quad Sets: Strengthening;Left;15 reps;Limitations Short Arc Quad Sets Limitations: 3# Heel Slides: Strengthening;15 reps Bridges: 10 reps;Limitations Bridges Limitations: L LE only Knee Extension: Strengthening;Left;10 reps;Limitations Knee Extension Limitations: 5# Sidelying Hip ABduction: Strengthening;15 reps Hip ABduction Limitations: 5# Prone  Hip Extension: 10 reps;Limitations Hip Extension Limitations: 3#    Physical Therapy Assessment and Plan PT Assessment and Plan Clinical Impression Statement: Pt continues to improve in functional ability pain remains low Rehab Potential: Good PT Plan: continue to concentrate on knee ext, hip ab, ext strength.    Goals    Problem List Patient Active Problem List  Diagnoses  . S/P total knee replacement  . Knee pain  . Knee stiffness  . Left leg weakness  . Abnormal gait    PT - End of Session Activity Tolerance: Patient tolerated treatment well General Behavior  During Session: [facility] for tasks performed Cognition: [facility] for tasks performed  RUSSELL,[name] 04/27/2011, 9:37 AM

## 2011-05-01 ENCOUNTER — Ambulatory Visit (HOSPITAL_COMMUNITY)
Admission: RE | Admit: 2011-05-01 | Discharge: 2011-05-01 | Disposition: A | Discharge status: Home / Self Care | Payer: BC Managed Care – PPO | Source: Ambulatory Visit | Attending: Physical Therapy | Admitting: Physical Therapy

## 2011-05-01 DIAGNOSIS — R29898 Other symptoms and signs involving the musculoskeletal system: Secondary | ICD-10-CM

## 2011-05-01 NOTE — Progress Notes (Addendum)
Physical Therapy Evaluation  Patient Details  Name: [name] MRN: [id number] Date of Birth: March 27, 1956  Today's Date: 05/01/2011 Time: 4098-1191 Time Calculation (min): 40 min Visit#: 5  of 8   Re-eval:  MMT 1 unit  Gait 10 min Therex 23 min  Past Medical History: No past medical history on file. Past Surgical History: No past surgical history on file.  Subjective Symptoms/Limitations Symptoms: Pt states she has been working on her walking How long can you sit comfortably?: Able to watch a movie; 2 hours How long can you stand comfortably?: Standing is no problem How long can you walk comfortably?: Able to walk for five hours. Pain Assessment Currently in Pain?: No/denies  Objective:   Assessment LLE AROM (degrees) Left Knee Extension 0-130: 0  Left Knee Flexion 0-140: -120  LLE Strength Left Hip Flexion: 5/5 Left Hip Extension: 4/5 (R hip extension is 4/5 as well) Left Hip ABduction:  (4+) Left Hip ADduction: 5/5 Left Knee Flexion: 5/5 Left Knee Extension: 4/5  Exercise/Treatments Aerobic Tread Mill: Gait trainer x 10 ' Machines for Strengthening Cybex Knee Extension: 1.5PL B LE extend, L quad eccentric control descending 2 x 15 Standing Hip ADduction: 20 reps;Limitations Hip ADduction Limitations: Hip Abduction 20x with blue tband Wall Squat: 15 reps;10 seconds Stairs: 2 RT reciprocal w 1HR  Other Standing Knee Exercises: Hip extension with blue tband 20x    Physical Therapy Assessment and Plan PT Assessment and Plan Clinical Impression Statement: Reassessment complete prior return to MD, all goals met.  Pt with improved strength, normalized gait, ROM WNL, compliance with HEP and increased pain tolerance.   PT Plan: D/C to HEP.    Goals Home Exercise Program PT Goal: Perform Home Exercise Program - Progress: Met PT Short Term Goals PT Short Term Goal 1 - Progress: Met PT Short Term Goal 2 - Progress: Met PT Short Term Goal 3 - Progress:  Met PT Short Term Goal 4 - Progress: Met PT Short Term Goal 5 - Progress: Met PT Long Term Goals PT Long Term Goal 1: ROM is wnl but pt still has an antalgic gait due to pain. PT Long Term Goal 1 - Progress: Met PT Long Term Goal 2: Pt able to be up more than an hour but is still having pain.  Strength is generally 4/5  PT Long Term Goal 2 - Progress: Met Long Term Goal 3 Progress: Met Long Term Goal 4 Progress: Met Long Term Goal 5 Progress: Progressing toward goal PT Long Term Goal 6: met  Problem List Patient Active Problem List  Diagnoses  . S/P total knee replacement  . Knee pain  . Knee stiffness  . Left leg weakness  . Abnormal gait    PT - End of Session Activity Tolerance: Patient tolerated treatment well General Behavior During Session: [facility] for tasks performed Cognition: [facility] for tasks performed   Juel Burrow 05/01/2011, 10:05 AM

## 2011-05-03 ENCOUNTER — Telehealth (HOSPITAL_COMMUNITY): Payer: Self-pay | Admitting: *Deleted

## 2011-05-04 ENCOUNTER — Ambulatory Visit (HOSPITAL_COMMUNITY): Payer: BC Managed Care – PPO | Admitting: *Deleted

## 2011-05-10 ENCOUNTER — Other Ambulatory Visit: Payer: Self-pay | Admitting: Adult Health

## 2011-05-10 ENCOUNTER — Other Ambulatory Visit (HOSPITAL_COMMUNITY)
Admission: RE | Admit: 2011-05-10 | Discharge: 2011-05-10 | Disposition: A | Discharge status: Home / Self Care | Payer: BC Managed Care – PPO | Source: Ambulatory Visit | Attending: Obstetrics and Gynecology | Admitting: Obstetrics and Gynecology

## 2011-05-10 DIAGNOSIS — Z01419 Encounter for gynecological examination (general) (routine) without abnormal findings: Secondary | ICD-10-CM | POA: Insufficient documentation

## 2011-07-21 ENCOUNTER — Other Ambulatory Visit: Payer: Self-pay | Admitting: Orthopedic Surgery

## 2011-07-21 DIAGNOSIS — M79605 Pain in left leg: Secondary | ICD-10-CM

## 2011-07-27 ENCOUNTER — Ambulatory Visit
Admission: RE | Admit: 2011-07-27 | Discharge: 2011-07-27 | Disposition: A | Discharge status: Home / Self Care | Payer: BC Managed Care – PPO | Source: Ambulatory Visit | Attending: Orthopedic Surgery | Admitting: Orthopedic Surgery

## 2011-07-27 DIAGNOSIS — M79605 Pain in left leg: Secondary | ICD-10-CM

## 2014-01-23 ENCOUNTER — Ambulatory Visit (HOSPITAL_COMMUNITY)
Admission: RE | Admit: 2014-01-23 | Discharge: 2014-01-23 | Disposition: A | Discharge status: Home / Self Care | Payer: Disability Insurance | Source: Ambulatory Visit | Attending: Family Medicine | Admitting: Family Medicine

## 2014-01-23 ENCOUNTER — Other Ambulatory Visit (HOSPITAL_COMMUNITY): Payer: Self-pay | Admitting: Family Medicine

## 2014-01-23 DIAGNOSIS — M25559 Pain in unspecified hip: Secondary | ICD-10-CM | POA: Insufficient documentation

## 2014-01-23 DIAGNOSIS — M1990 Unspecified osteoarthritis, unspecified site: Secondary | ICD-10-CM

## 2014-01-23 DIAGNOSIS — M25569 Pain in unspecified knee: Secondary | ICD-10-CM | POA: Insufficient documentation

## 2014-01-23 DIAGNOSIS — M797 Fibromyalgia: Secondary | ICD-10-CM

## 2014-01-23 DIAGNOSIS — Z96659 Presence of unspecified artificial knee joint: Secondary | ICD-10-CM | POA: Diagnosis not present

## 2014-03-05 ENCOUNTER — Other Ambulatory Visit (HOSPITAL_COMMUNITY): Payer: Self-pay | Admitting: Family Medicine

## 2014-03-05 ENCOUNTER — Other Ambulatory Visit: Payer: Self-pay | Admitting: Adult Health

## 2014-03-05 DIAGNOSIS — Z1231 Encounter for screening mammogram for malignant neoplasm of breast: Secondary | ICD-10-CM

## 2014-03-11 ENCOUNTER — Other Ambulatory Visit (HOSPITAL_COMMUNITY): Payer: Self-pay | Admitting: Family Medicine

## 2014-03-11 ENCOUNTER — Ambulatory Visit (HOSPITAL_COMMUNITY)
Admission: RE | Admit: 2014-03-11 | Discharge: 2014-03-11 | Disposition: A | Discharge status: Home / Self Care | Payer: 59 | Source: Ambulatory Visit | Attending: Adult Health | Admitting: Adult Health

## 2014-03-11 DIAGNOSIS — Z1231 Encounter for screening mammogram for malignant neoplasm of breast: Secondary | ICD-10-CM | POA: Diagnosis present

## 2014-03-11 DIAGNOSIS — R748 Abnormal levels of other serum enzymes: Secondary | ICD-10-CM

## 2014-03-13 ENCOUNTER — Ambulatory Visit (HOSPITAL_COMMUNITY)
Admission: RE | Admit: 2014-03-13 | Discharge: 2014-03-13 | Disposition: A | Discharge status: Home / Self Care | Payer: 59 | Source: Ambulatory Visit | Attending: Family Medicine | Admitting: Family Medicine

## 2014-03-13 DIAGNOSIS — Z9049 Acquired absence of other specified parts of digestive tract: Secondary | ICD-10-CM | POA: Insufficient documentation

## 2014-03-13 DIAGNOSIS — R748 Abnormal levels of other serum enzymes: Secondary | ICD-10-CM | POA: Diagnosis present

## 2014-03-13 DIAGNOSIS — K7689 Other specified diseases of liver: Secondary | ICD-10-CM | POA: Diagnosis not present

## 2014-03-19 ENCOUNTER — Encounter: Payer: Self-pay | Admitting: Adult Health

## 2014-03-19 ENCOUNTER — Other Ambulatory Visit (HOSPITAL_COMMUNITY)
Admission: RE | Admit: 2014-03-19 | Discharge: 2014-03-19 | Disposition: A | Discharge status: Home / Self Care | Payer: 59 | Source: Ambulatory Visit | Attending: Adult Health | Admitting: Adult Health

## 2014-03-19 ENCOUNTER — Ambulatory Visit (INDEPENDENT_AMBULATORY_CARE_PROVIDER_SITE_OTHER): Payer: 59 | Admitting: Adult Health

## 2014-03-19 VITALS — BP 140/80 | HR 76 | Ht 63.0 in | Wt 178.0 lb

## 2014-03-19 DIAGNOSIS — Z01419 Encounter for gynecological examination (general) (routine) without abnormal findings: Secondary | ICD-10-CM | POA: Insufficient documentation

## 2014-03-19 DIAGNOSIS — Z1151 Encounter for screening for human papillomavirus (HPV): Secondary | ICD-10-CM | POA: Diagnosis present

## 2014-03-19 DIAGNOSIS — Z1212 Encounter for screening for malignant neoplasm of rectum: Secondary | ICD-10-CM

## 2014-03-19 LAB — HEMOCCULT GUIAC POC 1CARD (OFFICE): FECAL OCCULT BLD: NEGATIVE

## 2014-03-19 NOTE — Patient Instructions (Signed)
Physical in 1 year Mammogram yearly  Colonoscopy per GI Labs per PCP

## 2014-03-19 NOTE — Progress Notes (Signed)
Patient ID: Forbes CellarVirginia T Rohlfs, female   DOB: 04/09/1956, 58 y.o.   MRN: [id number] History of Present Illness: [name] is a 58 year old white female, married in for pap and physical.Had labs with PCP.Has lost  50+ lbs.Reviewed mammogram with pt, was negative.   Current Medications, Allergies, Past Medical History, Past Surgical History, Family History and Social History were reviewed in [name].     Review of Systems: Patient denies any headaches, blurred vision, shortness of breath, chest pain, abdominal pain, problems with bowel movements, urination, or intercourse. Not having sex, has pain in left knee at times, no mood swings, has retired due to fibromyalgia.    Physical Exam:BP 140/80 mmHg  Pulse 76  Ht 5\' 3"  (1.6 m)  Wt 178 lb (80.74 kg)  BMI 31.54 kg/m2 General:  Well developed, well nourished, no acute distress Skin:  Warm and dry Neck:  Midline trachea, normal thyroid Lungs; Clear to auscultation bilaterally Breast:  No dominant palpable mass, retraction, or nipple discharge Cardiovascular: Regular rate and rhythm Abdomen:  Soft, non tender, no hepatosplenomegaly Pelvic:  External genitalia is normal in appearance for age, no lesions.  The vagina has decreased color, moisture and rugae. The cervix is atrophic, pap with HPV performed.  Uterus is felt to be normal size, shape, and contour.  No adnexal masses or tenderness noted. Rectal: Good sphincter tone, no polyps, or hemorrhoids felt.  Hemoccult negative. Extremities:  No swelling or varicosities noted Psych:  No mood changes,alert and cooperative, seems happy   Impression:  Well woman gyn exam with pap   Plan: Physical in 1 year Mammogram yearly  Labs with PCP Colonoscopy per GI

## 2014-03-23 LAB — CYTOLOGY - PAP

## 2014-11-08 ENCOUNTER — Encounter (HOSPITAL_COMMUNITY): Payer: Self-pay | Admitting: Emergency Medicine

## 2014-11-08 ENCOUNTER — Emergency Department (HOSPITAL_COMMUNITY): Payer: BLUE CROSS/BLUE SHIELD

## 2014-11-08 ENCOUNTER — Inpatient Hospital Stay (HOSPITAL_COMMUNITY)
Admission: EM | Admit: 2014-11-08 | Discharge: 2014-11-10 | DRG: 392 | Disposition: A | Discharge status: Home / Self Care | Payer: BLUE CROSS/BLUE SHIELD | Attending: Internal Medicine | Admitting: Internal Medicine

## 2014-11-08 DIAGNOSIS — K5792 Diverticulitis of intestine, part unspecified, without perforation or abscess without bleeding: Secondary | ICD-10-CM | POA: Diagnosis present

## 2014-11-08 DIAGNOSIS — Z8249 Family history of ischemic heart disease and other diseases of the circulatory system: Secondary | ICD-10-CM

## 2014-11-08 DIAGNOSIS — R1032 Left lower quadrant pain: Secondary | ICD-10-CM | POA: Diagnosis present

## 2014-11-08 DIAGNOSIS — E119 Type 2 diabetes mellitus without complications: Secondary | ICD-10-CM | POA: Diagnosis present

## 2014-11-08 DIAGNOSIS — Z833 Family history of diabetes mellitus: Secondary | ICD-10-CM

## 2014-11-08 DIAGNOSIS — I10 Essential (primary) hypertension: Secondary | ICD-10-CM | POA: Diagnosis present

## 2014-11-08 DIAGNOSIS — M797 Fibromyalgia: Secondary | ICD-10-CM | POA: Diagnosis present

## 2014-11-08 DIAGNOSIS — N289 Disorder of kidney and ureter, unspecified: Secondary | ICD-10-CM

## 2014-11-08 DIAGNOSIS — R109 Unspecified abdominal pain: Secondary | ICD-10-CM | POA: Diagnosis present

## 2014-11-08 DIAGNOSIS — Z87891 Personal history of nicotine dependence: Secondary | ICD-10-CM | POA: Diagnosis not present

## 2014-11-08 DIAGNOSIS — E785 Hyperlipidemia, unspecified: Secondary | ICD-10-CM | POA: Diagnosis present

## 2014-11-08 DIAGNOSIS — K5720 Diverticulitis of large intestine with perforation and abscess without bleeding: Secondary | ICD-10-CM | POA: Diagnosis not present

## 2014-11-08 DIAGNOSIS — Z811 Family history of alcohol abuse and dependence: Secondary | ICD-10-CM | POA: Diagnosis not present

## 2014-11-08 HISTORY — DX: Diverticulitis of intestine, part unspecified, without perforation or abscess without bleeding: K57.92

## 2014-11-08 LAB — URINALYSIS, ROUTINE W REFLEX MICROSCOPIC
Bilirubin Urine: NEGATIVE
GLUCOSE, UA: NEGATIVE mg/dL
Hgb urine dipstick: NEGATIVE
KETONES UR: NEGATIVE mg/dL
Leukocytes, UA: NEGATIVE
NITRITE: NEGATIVE
PH: 6 (ref 5.0–8.0)
SPECIFIC GRAVITY, URINE: 1.025 (ref 1.005–1.030)
Urobilinogen, UA: 0.2 mg/dL (ref 0.0–1.0)

## 2014-11-08 LAB — CBC WITH DIFFERENTIAL/PLATELET
BASOS ABS: 0 10*3/uL (ref 0.0–0.1)
BASOS PCT: 0 % (ref 0–1)
EOS ABS: 0.1 10*3/uL (ref 0.0–0.7)
EOS PCT: 1 % (ref 0–5)
HEMATOCRIT: 39.9 % (ref 36.0–46.0)
HEMOGLOBIN: 13.7 g/dL (ref 12.0–15.0)
LYMPHS PCT: 28 % (ref 12–46)
Lymphs Abs: 2.4 10*3/uL (ref 0.7–4.0)
MCH: 31.2 pg (ref 26.0–34.0)
MCHC: 34.3 g/dL (ref 30.0–36.0)
MCV: 90.9 fL (ref 78.0–100.0)
MONO ABS: 0.5 10*3/uL (ref 0.1–1.0)
Monocytes Relative: 6 % (ref 3–12)
Neutro Abs: 5.6 10*3/uL (ref 1.7–7.7)
Neutrophils Relative %: 65 % (ref 43–77)
Platelets: 206 10*3/uL (ref 150–400)
RBC: 4.39 MIL/uL (ref 3.87–5.11)
RDW: 13.6 % (ref 11.5–15.5)
WBC: 8.6 10*3/uL (ref 4.0–10.5)

## 2014-11-08 LAB — COMPREHENSIVE METABOLIC PANEL
ALT: 23 U/L (ref 14–54)
ANION GAP: 8 (ref 5–15)
AST: 23 U/L (ref 15–41)
Albumin: 4 g/dL (ref 3.5–5.0)
Alkaline Phosphatase: 54 U/L (ref 38–126)
BUN: 24 mg/dL — ABNORMAL HIGH (ref 6–20)
CALCIUM: 10.2 mg/dL (ref 8.9–10.3)
CO2: 26 mmol/L (ref 22–32)
CREATININE: 1.02 mg/dL — AB (ref 0.44–1.00)
Chloride: 105 mmol/L (ref 101–111)
GFR, EST NON AFRICAN AMERICAN: 59 mL/min — AB (ref >60)
GLUCOSE: 161 mg/dL — AB (ref 65–99)
Potassium: 3.9 mmol/L (ref 3.5–5.1)
Sodium: 139 mmol/L (ref 135–145)
Total Bilirubin: 0.3 mg/dL (ref 0.3–1.2)
Total Protein: 7.3 g/dL (ref 6.5–8.1)

## 2014-11-08 LAB — I-STAT CG4 LACTIC ACID, ED: Lactic Acid, Venous: 2.04 mmol/L (ref 0.5–2.0)

## 2014-11-08 LAB — URINE MICROSCOPIC-ADD ON

## 2014-11-08 LAB — LIPASE, BLOOD: Lipase: 30 U/L (ref 22–51)

## 2014-11-08 MED ORDER — METRONIDAZOLE IN NACL 5-0.79 MG/ML-% IV SOLN
500.0000 mg | Freq: Once | INTRAVENOUS | Status: AC
  Administered 2014-11-08: 500 mg via INTRAVENOUS
  Filled 2014-11-08: qty 100

## 2014-11-08 MED ORDER — TART CHERRY ADVANCED PO CAPS: 1.0000 | ORAL_CAPSULE | Freq: Every day | ORAL | Status: DC

## 2014-11-08 MED ORDER — SODIUM CHLORIDE 0.9 % IV SOLN
INTRAVENOUS | Status: AC
  Administered 2014-11-09: 1 mL via INTRAVENOUS
  Administered 2014-11-09: 21:00:00 via INTRAVENOUS

## 2014-11-08 MED ORDER — ZOLPIDEM TARTRATE 5 MG PO TABS
5.0000 mg | ORAL_TABLET | Freq: Every day | ORAL | Status: DC
Start: 2014-11-09 — End: 2014-11-10
  Administered 2014-11-09 (×2): 5 mg via ORAL
  Filled 2014-11-08 (×2): qty 1

## 2014-11-08 MED ORDER — INSULIN ASPART 100 UNIT/ML ~~LOC~~ SOLN
0.0000 [IU] | Freq: Three times a day (TID) | SUBCUTANEOUS | Status: DC
  Administered 2014-11-09 – 2014-11-10 (×4): 1 [IU] via SUBCUTANEOUS

## 2014-11-08 MED ORDER — SIMVASTATIN 20 MG PO TABS
20.0000 mg | ORAL_TABLET | Freq: Every day | ORAL | Status: DC
  Administered 2014-11-09 – 2014-11-10 (×2): 20 mg via ORAL
  Filled 2014-11-08 (×2): qty 1

## 2014-11-08 MED ORDER — ONDANSETRON HCL 4 MG/2ML IJ SOLN
4.0000 mg | Freq: Four times a day (QID) | INTRAMUSCULAR | Status: DC | PRN
  Administered 2014-11-09 (×2): 4 mg via INTRAVENOUS
  Filled 2014-11-08 (×2): qty 2

## 2014-11-08 MED ORDER — CIPROFLOXACIN IN D5W 400 MG/200ML IV SOLN
400.0000 mg | Freq: Two times a day (BID) | INTRAVENOUS | Status: DC
  Administered 2014-11-09 – 2014-11-10 (×3): 400 mg via INTRAVENOUS
  Filled 2014-11-08 (×3): qty 200

## 2014-11-08 MED ORDER — LISINOPRIL-HYDROCHLOROTHIAZIDE 20-12.5 MG PO TABS: 1.0000 | ORAL_TABLET | Freq: Every day | ORAL | Status: DC

## 2014-11-08 MED ORDER — DICLOFENAC SODIUM 1 % TD GEL
1.0000 "application " | Freq: Every day | TRANSDERMAL | Status: DC | PRN
  Filled 2014-11-08: qty 100

## 2014-11-08 MED ORDER — ACETAMINOPHEN 650 MG RE SUPP: 650.0000 mg | Freq: Four times a day (QID) | RECTAL | Status: DC | PRN

## 2014-11-08 MED ORDER — FENOFIBRATE 160 MG PO TABS
160.0000 mg | ORAL_TABLET | Freq: Every day | ORAL | Status: DC
  Administered 2014-11-09 – 2014-11-10 (×2): 160 mg via ORAL
  Filled 2014-11-08 (×2): qty 1

## 2014-11-08 MED ORDER — SODIUM CHLORIDE 0.9 % IJ SOLN
3.0000 mL | Freq: Two times a day (BID) | INTRAMUSCULAR | Status: DC
  Administered 2014-11-09 – 2014-11-10 (×2): 3 mL via INTRAVENOUS

## 2014-11-08 MED ORDER — IOHEXOL 300 MG/ML  SOLN
25.0000 mL | Freq: Once | INTRAMUSCULAR | Status: AC | PRN
  Administered 2014-11-08: 25 mL via ORAL

## 2014-11-08 MED ORDER — ENOXAPARIN SODIUM 40 MG/0.4ML ~~LOC~~ SOLN
40.0000 mg | SUBCUTANEOUS | Status: DC
  Administered 2014-11-09 (×2): 40 mg via SUBCUTANEOUS
  Filled 2014-11-08 (×2): qty 0.4

## 2014-11-08 MED ORDER — IOHEXOL 300 MG/ML  SOLN
100.0000 mL | Freq: Once | INTRAMUSCULAR | Status: AC | PRN
  Administered 2014-11-08: 100 mL via INTRAVENOUS

## 2014-11-08 MED ORDER — MORPHINE SULFATE 4 MG/ML IJ SOLN
4.0000 mg | Freq: Once | INTRAMUSCULAR | Status: AC
  Administered 2014-11-08: 4 mg via INTRAVENOUS
  Filled 2014-11-08: qty 1

## 2014-11-08 MED ORDER — MORPHINE SULFATE 2 MG/ML IJ SOLN
0.5000 mg | INTRAMUSCULAR | Status: DC | PRN
  Administered 2014-11-09 – 2014-11-10 (×5): 0.5 mg via INTRAVENOUS
  Filled 2014-11-08 (×5): qty 1

## 2014-11-08 MED ORDER — METRONIDAZOLE IN NACL 5-0.79 MG/ML-% IV SOLN
500.0000 mg | Freq: Three times a day (TID) | INTRAVENOUS | Status: DC
  Administered 2014-11-09 – 2014-11-10 (×4): 500 mg via INTRAVENOUS
  Filled 2014-11-08 (×4): qty 100

## 2014-11-08 MED ORDER — CIPROFLOXACIN IN D5W 400 MG/200ML IV SOLN
400.0000 mg | Freq: Once | INTRAVENOUS | Status: AC
  Administered 2014-11-08: 400 mg via INTRAVENOUS
  Filled 2014-11-08: qty 200

## 2014-11-08 MED ORDER — SODIUM CHLORIDE 0.9 % IV SOLN
INTRAVENOUS | Status: AC
  Administered 2014-11-08: 1 mL via INTRAVENOUS

## 2014-11-08 MED ORDER — CHOLESTYRAMINE 4 G PO PACK
4.0000 g | PACK | Freq: Three times a day (TID) | ORAL | Status: DC
  Administered 2014-11-09 – 2014-11-10 (×5): 4 g via ORAL
  Filled 2014-11-08 (×8): qty 1

## 2014-11-08 MED ORDER — SODIUM CHLORIDE 0.9 % IV SOLN
Freq: Once | INTRAVENOUS | Status: AC
  Administered 2014-11-08: 22:00:00 via INTRAVENOUS

## 2014-11-08 MED ORDER — ACETAMINOPHEN 325 MG PO TABS: 650.0000 mg | ORAL_TABLET | Freq: Four times a day (QID) | ORAL | Status: DC | PRN

## 2014-11-08 MED ORDER — ONDANSETRON HCL 4 MG/2ML IJ SOLN
4.0000 mg | Freq: Once | INTRAMUSCULAR | Status: AC
  Administered 2014-11-08: 4 mg via INTRAVENOUS
  Filled 2014-11-08: qty 2

## 2014-11-08 MED ORDER — RISAQUAD PO CAPS
1.0000 | ORAL_CAPSULE | Freq: Every day | ORAL | Status: DC
  Administered 2014-11-09 – 2014-11-10 (×2): 1 via ORAL
  Filled 2014-11-08 (×2): qty 1

## 2014-11-08 MED ORDER — SODIUM CHLORIDE 0.9 % IV SOLN
Freq: Once | INTRAVENOUS | Status: AC
  Administered 2014-11-08: 19:00:00 via INTRAVENOUS

## 2014-11-08 MED ORDER — ASPIRIN 81 MG PO CHEW
81.0000 mg | CHEWABLE_TABLET | Freq: Every day | ORAL | Status: DC
  Administered 2014-11-09 – 2014-11-10 (×2): 81 mg via ORAL
  Filled 2014-11-08 (×2): qty 1

## 2014-11-08 NOTE — H&P (Signed)
[name] is an 59 y.o. female.     Pcp:  Delman Cheadle, [place], [place]  Chief Complaint: abdominal pain HPI: 59 yo female with dm2, htn, hyperlipidemia, hx of diverticulitis apparently c/o left lower abd pain x2 days,  Sharp, intermittent but worse today and therefore presented to ED for evaluation.  + nausea,  Pt notes that she ate almonds which may have triggered this diverticulitis.  Pt denies fever, chills, emesis, diarrhea, brbpr, black stool.  CT scan showed sigmoid diverticulitis and some dots of extraluminal air.  ED is consulting surgery regarding any input on possibly microperforation.  Pt will be admitted for diverticulitis.   Past Medical History  Diagnosis Date  . Diabetes mellitus without complication   . Hyperlipidemia   . Hypertension   . Fibromyalgia   . Diverticulitis     Past Surgical History  Procedure Laterality Date  . Knee surgery Left   . Cholecystectomy      Family History  Problem Relation Age of Onset  . Diabetes Mother   . Dementia Mother   . Hypertension Mother   . Alcohol abuse Father   . Cirrhosis Father   . Arthritis Sister     rheumatoid  . Diabetes Brother   . Hypertension Brother   . Diabetes Maternal Grandmother   . Fibromyalgia Sister   . Hypertension Sister   . Fibromyalgia Sister   . Hypertension Sister   . Diabetes Sister   . Hypertension Sister   . Cirrhosis Brother   . Cancer Brother     pancreatic   Social History:  reports that she quit smoking about 13 years ago. Her smoking use included Cigarettes. She has a 57 pack-year smoking history. She has never used smokeless tobacco. She reports that she drinks alcohol. She reports that she does not use illicit drugs.  Allergies:  Allergies  Allergen Reactions  . Bee Pollen Anaphylaxis and Swelling  . Codeine     REACTION: nauseated,claustrophobic   Medications reviewed   Results for orders placed or performed during the hospital encounter of  11/08/14 (from the past 48 hour(s))  CBC with Differential     Status: None   Collection Time: 11/08/14  5:46 PM  Result Value Ref Range   WBC 8.6 4.0 - 10.5 K/uL   RBC 4.39 3.87 - 5.11 MIL/uL   Hemoglobin 13.7 12.0 - 15.0 g/dL   HCT 39.9 36.0 - 46.0 %   MCV 90.9 78.0 - 100.0 fL   MCH 31.2 26.0 - 34.0 pg   MCHC 34.3 30.0 - 36.0 g/dL   RDW 13.6 11.5 - 15.5 %   Platelets 206 150 - 400 K/uL   Neutrophils Relative % 65 43 - 77 %   Neutro Abs 5.6 1.7 - 7.7 K/uL   Lymphocytes Relative 28 12 - 46 %   Lymphs Abs 2.4 0.7 - 4.0 K/uL   Monocytes Relative 6 3 - 12 %   Monocytes Absolute 0.5 0.1 - 1.0 K/uL   Eosinophils Relative 1 0 - 5 %   Eosinophils Absolute 0.1 0.0 - 0.7 K/uL   Basophils Relative 0 0 - 1 %   Basophils Absolute 0.0 0.0 - 0.1 K/uL  Comprehensive metabolic panel     Status: Abnormal   Collection Time: 11/08/14  5:46 PM  Result Value Ref Range   Sodium 139 135 - 145 mmol/L   Potassium 3.9 3.5 - 5.1 mmol/L   Chloride 105 101 - 111 mmol/L  CO2 26 22 - 32 mmol/L   Glucose, Bld 161 (H) 65 - 99 mg/dL   BUN 24 (H) 6 - 20 mg/dL   Creatinine, Ser 1.02 (H) 0.44 - 1.00 mg/dL   Calcium 10.2 8.9 - 10.3 mg/dL   Total Protein 7.3 6.5 - 8.1 g/dL   Albumin 4.0 3.5 - 5.0 g/dL   AST 23 15 - 41 U/L   ALT 23 14 - 54 U/L   Alkaline Phosphatase 54 38 - 126 U/L   Total Bilirubin 0.3 0.3 - 1.2 mg/dL   GFR calc non Af Amer 59 (L) >60 mL/min   GFR calc Af Amer >60 >60 mL/min    Comment: (NOTE) The eGFR has been calculated using the CKD EPI equation. This calculation has not been validated in all clinical situations. eGFR's persistently <60 mL/min signify possible Chronic Kidney Disease.    Anion gap 8 5 - 15  Lipase, blood     Status: None   Collection Time: 11/08/14  5:46 PM  Result Value Ref Range   Lipase 30 22 - 51 U/L  Urinalysis, Routine w reflex microscopic (not at [facility])     Status: Abnormal   Collection Time: 11/08/14  7:15 PM  Result Value Ref Range   Color, Urine YELLOW  YELLOW   APPearance CLEAR CLEAR   Specific Gravity, Urine 1.025 1.005 - 1.030   pH 6.0 5.0 - 8.0   Glucose, UA NEGATIVE NEGATIVE mg/dL   Hgb urine dipstick NEGATIVE NEGATIVE   Bilirubin Urine NEGATIVE NEGATIVE   Ketones, ur NEGATIVE NEGATIVE mg/dL   Protein, ur TRACE (A) NEGATIVE mg/dL   Urobilinogen, UA 0.2 0.0 - 1.0 mg/dL   Nitrite NEGATIVE NEGATIVE   Leukocytes, UA NEGATIVE NEGATIVE  Urine microscopic-add on     Status: None   Collection Time: 11/08/14  7:15 PM  Result Value Ref Range   Squamous Epithelial / LPF RARE RARE   WBC, UA 3-6 <3 WBC/hpf   RBC / HPF 0-2 <3 RBC/hpf   Bacteria, UA RARE RARE  I-Stat CG4 Lactic Acid, ED     Status: Abnormal   Collection Time: 11/08/14  7:31 PM  Result Value Ref Range   Lactic Acid, Venous 2.04 (HH) 0.5 - 2.0 mmol/L   Comment NOTIFIED PHYSICIAN    Ct Abdomen Pelvis W Contrast  11/08/2014   CLINICAL DATA:  Left lower quadrant abdominal pain beginning 2 days ago. Nausea. Personal history of diverticulitis.  EXAM: CT ABDOMEN AND PELVIS WITH CONTRAST  TECHNIQUE: Multidetector CT imaging of the abdomen and pelvis was performed using the standard protocol following bolus administration of intravenous contrast.  CONTRAST:  31m OMNIPAQUE IOHEXOL 300 MG/ML SOLN, 1065mOMNIPAQUE IOHEXOL 300 MG/ML SOLN  COMPARISON:  10/03/2007  FINDINGS: Lung bases are clear. No pleural or pericardial fluid. The liver shows mild diffuse fatty change. No focal lesion. Previous cholecystectomy. The spleen is normal. The pancreas is normal. The adrenal glands are normal. The kidneys are normal. There is advanced atherosclerosis of the aorta but no aneurysm. The IVC is normal. No retroperitoneal mass or lymphadenopathy.  There is acute diverticulitis of the sigmoid colon with pericolic edema in a few dots of extraluminal air. No evidence of drainable abscess or gross free intraperitoneal air. Bladder is normal. Uterine mass is again demonstrated, presumably leiomyoma, enlarged  since the previous study, now measuring 6 cm in diameter.  IMPRESSION: Acute diverticulitis of the sigmoid colon with pericolic edema and a few dots of extraluminal air but no evidence  of drainable abscess or gross free intraperitoneal air.  Atherosclerosis of the aorta and its branch vessels.  Fatty liver.   Electronically Signed   By: Nelson Chimes M.D.   On: 11/08/2014 20:26    Review of Systems  Constitutional: Negative.   HENT: Negative.   Eyes: Negative.   Respiratory: Negative.   Cardiovascular: Negative.   Gastrointestinal: Positive for nausea and abdominal pain. Negative for heartburn, vomiting, diarrhea, constipation, blood in stool and melena.  Genitourinary: Negative.   Musculoskeletal: Negative.   Skin: Negative.   Neurological: Negative.   Endo/Heme/Allergies: Negative.   Psychiatric/Behavioral: Negative.     Blood pressure 148/62, pulse 84, temperature 98.7 F (37.1 C), temperature source Oral, resp. rate 18, height _0  (1.6 m), weight 81.194 kg (179 lb), SpO2 98 %. Physical Exam  Constitutional: She is oriented to person, place, and time. She appears well-developed and well-nourished.  HENT:  Head: Normocephalic and atraumatic.  Mouth/Throat: No oropharyngeal exudate.  Eyes: Conjunctivae and EOM are normal. Pupils are equal, round, and reactive to light. No scleral icterus.  Neck: Normal range of motion. Neck supple. No JVD present. No tracheal deviation present. No thyromegaly present.  Cardiovascular: Normal rate and regular rhythm.  Exam reveals no gallop and no friction rub.   No murmur heard. Respiratory: Effort normal and breath sounds normal. No respiratory distress. She has no wheezes. She has no rales.  GI: Soft. Bowel sounds are normal. She exhibits no distension. There is no tenderness. There is no rebound and no guarding.  Musculoskeletal: Normal range of motion. She exhibits no edema or tenderness.  Lymphadenopathy:    She has no cervical adenopathy.   Neurological: She is alert and oriented to person, place, and time. She has normal reflexes. She displays normal reflexes. No cranial nerve deficit. She exhibits normal muscle tone. Coordination normal.  Skin: Skin is warm and dry. No rash noted. No erythema. No pallor.  Psychiatric: She has a normal mood and affect. Her behavior is normal. Judgment and thought content normal.     Assessment/Plan Abdomina pain secondary to diverticulitis ? Microperforation Appreciate surgery input NPO Iv cipro, iv flagyl  REnal insufficiency Ns iv  Check cmp in am  Dm2 Hold metformin.  fsbs q4h, iss  DVT prophylaxis:  Cy Blamer 11/08/2014, 9:04 PM

## 2014-11-08 NOTE — ED Provider Notes (Signed)
CSN: [id number]     Arrival date & time 11/08/14  1647 History   First MD Initiated Contact with Patient 11/08/14 1820     Chief Complaint  Patient presents with  . Abdominal Pain     (Consider location/radiation/quality/duration/timing/severity/associated sxs/prior Treatment) HPI Comments: Patient with 2 day history of lower abdominal pain similar to previous episodes of diverticulitis. The pain is constant and worse with palpation and movement. Nothing makes it better. She endorses nausea but no vomiting. No diarrhea, urinary symptoms, fevers. No chest pain or shortness of breath. no previous abdominal surgeries other than cholecystectomy. Reports diverticulitis 2 5 years ago no episodes since. denies any chest pain or shortness of breath.  No diarrhea or blood in stool.  The history is provided by the patient.    Past Medical History  Diagnosis Date  . Diabetes mellitus without complication   . Hyperlipidemia   . Hypertension   . Fibromyalgia   . Diverticulitis    Past Surgical History  Procedure Laterality Date  . Knee surgery Left   . Cholecystectomy     Family History  Problem Relation Age of Onset  . Diabetes Mother   . Dementia Mother   . Hypertension Mother   . Alcohol abuse Father   . Cirrhosis Father   . Arthritis Sister     rheumatoid  . Diabetes Brother   . Hypertension Brother   . Diabetes Maternal Grandmother   . Fibromyalgia Sister   . Hypertension Sister   . Fibromyalgia Sister   . Hypertension Sister   . Diabetes Sister   . Hypertension Sister   . Cirrhosis Brother   . Cancer Brother     pancreatic   History  Substance Use Topics  . Smoking status: Former Smoker -- 1.50 packs/day for 38 years    Types: Cigarettes    Quit date: 03/26/2001  . Smokeless tobacco: Never Used  . Alcohol Use: 0.0 oz/week    0 Standard drinks or equivalent per week     Comment: occ glass of wine   OB History    Gravida Para Term Preterm AB TAB SAB Ectopic  Multiple Living   0 0 0 0 0 0 0 0 0 0      Review of Systems  Constitutional: Positive for activity change and appetite change. Negative for fever.  HENT: Negative for congestion and rhinorrhea.   Respiratory: Negative for cough, chest tightness and shortness of breath.   Cardiovascular: Negative for chest pain.  Gastrointestinal: Positive for nausea and abdominal pain. Negative for vomiting, diarrhea and blood in stool.  Genitourinary: Negative for dysuria and hematuria.  Musculoskeletal: Negative for myalgias and arthralgias.   A complete 10 system review of systems was obtained and all systems are negative except as noted in the HPI and PMH.      Allergies  Bee pollen and Codeine  Home Medications   Prior to Admission medications   Medication Sig Start Date End Date Taking? Authorizing Provider  aspirin 81 MG tablet Take 81 mg by mouth daily.   Yes Historical Provider, MD  cholestyramine Lanetta Inch) 4 GM/DOSE powder Take 4 g by mouth 3 (three) times daily.  10/31/14  Yes Historical Provider, MD  Coenzyme Q10 (COQ-10 PO) Take 1 capsule by mouth daily.   Yes Historical Provider, MD  fenofibrate 160 MG tablet Take 160 mg by mouth daily.  11/06/14  Yes Historical Provider, MD  ibuprofen (ADVIL,MOTRIN) 800 MG tablet Take 800 mg by mouth every 8 (eight)  hours as needed for mild pain or moderate pain.  10/20/14  Yes Historical Provider, MD  KRILL OIL PO Take 1 capsule by mouth daily.   Yes Historical Provider, MD  lisinopril-hydrochlorothiazide (PRINZIDE,ZESTORETIC) 20-12.5 MG per tablet Take 1 tablet by mouth daily. 02/25/14  Yes Historical Provider, MD  metFORMIN (GLUCOPHAGE-XR) 500 MG 24 hr tablet Take 500 mg by mouth 2 (two) times daily.  10/09/14  Yes Historical Provider, MD  Misc Natural Products (TART CHERRY ADVANCED) CAPS Take 1 capsule by mouth daily.   Yes Historical Provider, MD  Probiotic Product (PROBIOTIC DAILY) CAPS Take 1 capsule by mouth daily.   Yes Historical Provider, MD   simvastatin (ZOCOR) 20 MG tablet Take 20 mg by mouth daily.  10/09/14  Yes Historical Provider, MD  VOLTAREN 1 % GEL Apply 1 application topically daily as needed (for pain).  02/25/14  Yes Historical Provider, MD  zolpidem (AMBIEN) 10 MG tablet Take 10 mg by mouth at bedtime. 09/14/14  Yes Historical Provider, MD   BP 164/79 mmHg  Pulse 79  Temp(Src) 99 F (37.2 C) (Oral)  Resp 20  Ht  (1.6 m)  Wt 179 lb (81.194 kg)  BMI 31.72 kg/m2  SpO2 98% Physical Exam  Constitutional: She is oriented to person, place, and time. She appears well-developed and well-nourished. No distress.  HENT:  Head: Normocephalic and atraumatic.  Mouth/Throat: Oropharynx is clear and moist. No oropharyngeal exudate.  Eyes: Conjunctivae and EOM are normal. Pupils are equal, round, and reactive to light.  Neck: Normal range of motion. Neck supple.  No meningismus.  Cardiovascular: Normal rate, regular rhythm, normal heart sounds and intact distal pulses.   No murmur heard. Pulmonary/Chest: Effort normal and breath sounds normal. No respiratory distress.  Abdominal: Soft. There is tenderness. There is no rebound and no guarding.  TTP LLQ with guarding.  Musculoskeletal: Normal range of motion. She exhibits no edema or tenderness.  No CVAT  Neurological: She is alert and oriented to person, place, and time. No cranial nerve deficit. She exhibits normal muscle tone. Coordination normal.  No ataxia on finger to nose bilaterally. No pronator drift. 5/5 strength throughout. CN 2-12 intact. Negative Romberg. Equal grip strength. Sensation intact. Gait is normal.   Skin: Skin is warm.  Psychiatric: She has a normal mood and affect. Her behavior is normal.  Nursing note and vitals reviewed.   ED Course  Procedures (including critical care time) Labs Review Labs Reviewed  COMPREHENSIVE METABOLIC PANEL - Abnormal; Notable for the following:    Glucose, Bld 161 (*)    BUN 24 (*)    Creatinine, Ser 1.02 (*)     GFR calc non Af Amer 59 (*)    All other components within normal limits  URINALYSIS, ROUTINE W REFLEX MICROSCOPIC (NOT AT [facility]) - Abnormal; Notable for the following:    Protein, ur TRACE (*)    All other components within normal limits  I-STAT CG4 LACTIC ACID, ED - Abnormal; Notable for the following:    Lactic Acid, Venous 2.04 (*)    All other components within normal limits  CBC WITH DIFFERENTIAL/PLATELET  LIPASE, BLOOD  URINE MICROSCOPIC-ADD ON  I-STAT CG4 LACTIC ACID, ED    Imaging Review Ct Abdomen Pelvis W Contrast  11/08/2014   CLINICAL DATA:  Left lower quadrant abdominal pain beginning 2 days ago. Nausea. Personal history of diverticulitis.  EXAM: CT ABDOMEN AND PELVIS WITH CONTRAST  TECHNIQUE: Multidetector CT imaging of the abdomen and pelvis was performed  using the standard protocol following bolus administration of intravenous contrast.  CONTRAST:  25mL OMNIPAQUE IOHEXOL 300 MG/ML SOLN, 100mL OMNIPAQUE IOHEXOL 300 MG/ML SOLN  COMPARISON:  10/03/2007  FINDINGS: Lung bases are clear. No pleural or pericardial fluid. The liver shows mild diffuse fatty change. No focal lesion. Previous cholecystectomy. The spleen is normal. The pancreas is normal. The adrenal glands are normal. The kidneys are normal. There is advanced atherosclerosis of the aorta but no aneurysm. The IVC is normal. No retroperitoneal mass or lymphadenopathy.  There is acute diverticulitis of the sigmoid colon with pericolic edema in a few dots of extraluminal air. No evidence of drainable abscess or gross free intraperitoneal air. Bladder is normal. Uterine mass is again demonstrated, presumably leiomyoma, enlarged since the previous study, now measuring 6 cm in diameter.  IMPRESSION: Acute diverticulitis of the sigmoid colon with pericolic edema and a few dots of extraluminal air but no evidence of drainable abscess or gross free intraperitoneal air.  Atherosclerosis of the aorta and its branch vessels.  Fatty liver.    Electronically Signed   By: Paulina FusiMark  Shogry M.D.   On: 11/08/2014 20:26     EKG Interpretation None      MDM   Final diagnoses:  Diverticulitis of large intestine with perforation without bleeding   Left lower quadrant pain with nausea. No vomiting or urinary symptoms. Similar to previous episodes of diverticulitis.  Mild lactate elevation. White blood cell count normal.  CT scan shows acute sigmoid diverticulitis with pericolic edema and few dots of extraluminal air concerning for microperforation.  Patient started on IV Cipro and IV Flagyl. IV fluids continued.  Given the microperforation, she will be admitted. Discussed with Dr. Lovell SheehanJenkins of surgery who will consult. Dr. Selena BattenKim to admit.  Glynn OctaveStephen Strummer Canipe, MD 11/08/14 [phone]

## 2014-11-08 NOTE — ED Notes (Signed)
Patient c/o left lower abd pain that started 2 days ago. Patient report nausea but denies any vomiting, diarrhea, urinary symptoms, or fevers. Per patient has had diverticulitis before and pain feels the same.

## 2014-11-09 DIAGNOSIS — N289 Disorder of kidney and ureter, unspecified: Secondary | ICD-10-CM

## 2014-11-09 DIAGNOSIS — K5792 Diverticulitis of intestine, part unspecified, without perforation or abscess without bleeding: Secondary | ICD-10-CM

## 2014-11-09 DIAGNOSIS — I10 Essential (primary) hypertension: Secondary | ICD-10-CM | POA: Diagnosis present

## 2014-11-09 LAB — COMPREHENSIVE METABOLIC PANEL
ALT: 22 U/L (ref 14–54)
AST: 21 U/L (ref 15–41)
Albumin: 3.4 g/dL — ABNORMAL LOW (ref 3.5–5.0)
Alkaline Phosphatase: 48 U/L (ref 38–126)
Anion gap: 6 (ref 5–15)
BILIRUBIN TOTAL: 0.4 mg/dL (ref 0.3–1.2)
BUN: 16 mg/dL (ref 6–20)
CO2: 28 mmol/L (ref 22–32)
CREATININE: 0.9 mg/dL (ref 0.44–1.00)
Calcium: 9.2 mg/dL (ref 8.9–10.3)
Chloride: 105 mmol/L (ref 101–111)
GFR calc Af Amer: >60 mL/min (ref >60)
Glucose, Bld: 125 mg/dL — ABNORMAL HIGH (ref 65–99)
Potassium: 4.1 mmol/L (ref 3.5–5.1)
Sodium: 139 mmol/L (ref 135–145)
TOTAL PROTEIN: 6.6 g/dL (ref 6.5–8.1)

## 2014-11-09 LAB — CBC
HCT: 36.1 % (ref 36.0–46.0)
HEMOGLOBIN: 12.2 g/dL (ref 12.0–15.0)
MCH: 30.7 pg (ref 26.0–34.0)
MCHC: 33.8 g/dL (ref 30.0–36.0)
MCV: 90.9 fL (ref 78.0–100.0)
PLATELETS: 188 10*3/uL (ref 150–400)
RBC: 3.97 MIL/uL (ref 3.87–5.11)
RDW: 13.6 % (ref 11.5–15.5)
WBC: 6.5 10*3/uL (ref 4.0–10.5)

## 2014-11-09 LAB — GLUCOSE, CAPILLARY
GLUCOSE-CAPILLARY: 121 mg/dL — AB (ref 65–99)
Glucose-Capillary: 127 mg/dL — ABNORMAL HIGH (ref 65–99)
Glucose-Capillary: 108 mg/dL — ABNORMAL HIGH (ref 65–99)
Glucose-Capillary: 118 mg/dL — ABNORMAL HIGH (ref 65–99)

## 2014-11-09 MED ORDER — LISINOPRIL 10 MG PO TABS
20.0000 mg | ORAL_TABLET | Freq: Every day | ORAL | Status: DC
  Administered 2014-11-09 – 2014-11-10 (×2): 20 mg via ORAL
  Filled 2014-11-09: qty 2
  Filled 2014-11-09: qty 4

## 2014-11-09 MED ORDER — HYDROCHLOROTHIAZIDE 12.5 MG PO CAPS
12.5000 mg | ORAL_CAPSULE | Freq: Every day | ORAL | Status: DC
  Administered 2014-11-09 – 2014-11-10 (×2): 12.5 mg via ORAL
  Filled 2014-11-09 (×2): qty 1

## 2014-11-09 NOTE — Care Management Note (Signed)
Case Management Note  Patient Details  Name: Forbes CellarVirginia T Zukowski MRN: [id number] Date of Birth: 09/02/1955  Subjective/Objective:                  Pt admitted from home with diverticulitis. Pt lives with her husband and will return home at discharge. Pt has a cane and walker for home use. Pt stated that her husband does have to assist her with ADL's.  Action/Plan: Will continue to follow for discharge planning needs.  Expected Discharge Date:  11/11/14               Expected Discharge Plan:  Home/Self Care  In-House Referral:  NA  Discharge planning Services  CM Consult  Post Acute Care Choice:  NA Choice offered to:  NA  DME Arranged:    DME Agency:     HH Arranged:    HH Agency:     Status of Service:  Completed, signed off  Medicare Important Message Given:    Date Medicare IM Given:    Medicare IM give by:    Date Additional Medicare IM Given:    Additional Medicare Important Message give by:     If discussed at Long Length of Stay Meetings, dates discussed:    Additional Comments:  Cheryl FlashBlackwell, [place] Greenslade Crowder, RN 11/09/2014, 1:29 PM

## 2014-11-09 NOTE — Progress Notes (Signed)
[facility] PROGRESS NOTE  [name] WGN:[id number] DOB: [birth year] DOA: 11/08/2014 PCP: Pershing Proud  Assessment/Plan: Abdomina pain secondary to diverticulitis with microperf. Per CT. Lipase within limits of normal.  Remains afebrile and non-toxic appearing. Reports some improvement in abdominal pain. Will continue supportive therapy and antibiotics. Evaluated by general surgery who opine no surgical intervention warranted at this time. Recommended providing clear liquids. Continue Iv cipro, iv flagyl day #2.   REnal insufficiency: related to decreased oral intake. Resolved this am. Continue gentle IV fluids. Monitor.   Dm2: await A1c. Holding oral agents. Continue SSI. Monitor.   Code Status: full Family Communication: none present Disposition Plan: home hopefully 24-48 hours   Consultants:  General surgery  Procedures:  none  Antibiotics:  cipro 11/08/14>>  Flagyl 11/08/14>>  HPI/Subjective: Lying in bed. Reports some improvement with abdominal pain. Tolerating clears  Objective: Filed Vitals:   11/09/14 0615  BP: 168/86  Pulse: 87  Temp: 98.2 F (36.8 C)  Resp: 18    Intake/Output Summary (Last 24 hours) at 11/09/14 1011 Last data filed at 11/09/14 0800  Gross per 24 hour  Intake   1115 ml  Output      0 ml  Net   1115 ml   Filed Weights   11/08/14 1726 11/09/14 0615  Weight: 81.194 kg (179 lb) 82.555 kg (182 lb)    Exam:   General:  Well nourished appears comfortable  Cardiovascular: RRR no MGR No LE edema  Respiratory: normal effort BS clear bilaterally no wheeze  Abdomen: slightly distended but soft sluggish BS mild tenderness particularly lower quadrants   Musculoskeletal: no clubbing or cyanosis   Data Reviewed: Basic Metabolic Panel:  Recent Labs Lab 11/08/14 1746 11/09/14 0650  NA 139 139  K 3.9 4.1  CL 105 105  CO2 26 28  GLUCOSE 161* 125*  BUN 24* 16  CREATININE 1.02* 0.90  CALCIUM 10.2 9.2   Liver  Function Tests:  Recent Labs Lab 11/08/14 1746 11/09/14 0650  AST 23 21  ALT 23 22  ALKPHOS 54 48  BILITOT 0.3 0.4  PROT 7.3 6.6  ALBUMIN 4.0 3.4*    Recent Labs Lab 11/08/14 1746  LIPASE 30   No results for input(s): AMMONIA in the last 168 hours. CBC:  Recent Labs Lab 11/08/14 1746 11/09/14 0650  WBC 8.6 6.5  NEUTROABS 5.6  --   HGB 13.7 12.2  HCT 39.9 36.1  MCV 90.9 90.9  PLT 206 188   Cardiac Enzymes: No results for input(s): CKTOTAL, CKMB, CKMBINDEX, TROPONINI in the last 168 hours. BNP (last 3 results) No results for input(s): BNP in the last 8760 hours.  ProBNP (last 3 results) No results for input(s): PROBNP in the last 8760 hours.  CBG:  Recent Labs Lab 11/09/14 0734  GLUCAP 121*    No results found for this or any previous visit (from the past 240 hour(s)).   Studies: Ct Abdomen Pelvis W Contrast  11/08/2014   CLINICAL DATA:  Left lower quadrant abdominal pain beginning 2 days ago. Nausea. Personal history of diverticulitis.  EXAM: CT ABDOMEN AND PELVIS WITH CONTRAST  TECHNIQUE: Multidetector CT imaging of the abdomen and pelvis was performed using the standard protocol following bolus administration of intravenous contrast.  CONTRAST:  25mL OMNIPAQUE IOHEXOL 300 MG/ML SOLN, OMNIPAQUE IOHEXOL 300 MG/ML SOLN  COMPARISON:  10/03/2007  FINDINGS: Lung bases are clear. No pleural or pericardial fluid. The liver shows mild diffuse fatty change. No focal lesion. Previous  cholecystectomy. The spleen is normal. The pancreas is normal. The adrenal glands are normal. The kidneys are normal. There is advanced atherosclerosis of the aorta but no aneurysm. The IVC is normal. No retroperitoneal mass or lymphadenopathy.  There is acute diverticulitis of the sigmoid colon with pericolic edema in a few dots of extraluminal air. No evidence of drainable abscess or gross free intraperitoneal air. Bladder is normal. Uterine mass is again demonstrated, presumably  leiomyoma, enlarged since the previous study, now measuring 6 cm in diameter.  IMPRESSION: Acute diverticulitis of the sigmoid colon with pericolic edema and a few dots of extraluminal air but no evidence of drainable abscess or gross free intraperitoneal air.  Atherosclerosis of the aorta and its branch vessels.  Fatty liver.   Electronically Signed   By: Paulina FusiMark  Shogry M.D.   On: 11/08/2014 20:26    Scheduled Meds: . acidophilus  1 capsule Oral Daily  . aspirin  81 mg Oral Daily  . cholestyramine  4 g Oral TID  . ciprofloxacin  400 mg Intravenous Q12H  . enoxaparin (LOVENOX) injection  40 mg Subcutaneous Q24H  . fenofibrate  160 mg Oral Daily  . lisinopril  20 mg Oral Daily   And  . hydrochlorothiazide  12.5 mg Oral Daily  . insulin aspart  0-9 Units Subcutaneous TID WC  . metronidazole  500 mg Intravenous Q8H  . simvastatin  20 mg Oral Daily  . sodium chloride  3 mL Intravenous Q12H  . zolpidem  5 mg Oral QHS   Continuous Infusions: . sodium chloride 1 mL (11/09/14 0640)    Principal Problem:   Acute diverticulitis Active Problems:   Diabetes   Renal insufficiency   Abdominal pain   Hypertension    Time spent: 35 minutes    [facility] AscBLACK,Jamael Hoffmann M  [facility] Pager [phone]. If 7PM-7AM, please contact night-coverage at [email or web address], password [email or web address] 11/09/2014, 10:11 AM  LOS: 1 day

## 2014-11-09 NOTE — Progress Notes (Signed)
PHARMACIST - PHYSICIAN ORDER COMMUNICATION  CONCERNING: P&T Medication Policy on Herbal Medications  DESCRIPTION:  This patient's order for:  Tart Valentino SaxonCherry  has been noted.  This product(s) is classified as an "herbal" or natural product. Due to a lack of definitive safety studies or FDA approval, nonstandard manufacturing practices, plus the potential risk of unknown drug-drug interactions while on inpatient medications, the Pharmacy and Therapeutics Committee does not permit the use of "herbal" or natural products of this type within [facility].   ACTION TAKEN: The pharmacy department is unable to verify this order at this time and your patient has been informed of this safety policy. Please reevaluate patient's clinical condition at discharge and address if the herbal or natural product(s) should be resumed at that time.

## 2014-11-09 NOTE — Consult Note (Signed)
Reason for Consult: Sigmoid diverticulitis with microperforation Referring Physician: Hospitalist  [name] is an 59 y.o. female.  HPI: Patient is a 59 year old white female who presents with acute onset of left lower quadrant abdominal pain. She has had a CT scan of the abdomen to have sigmoid diverticulitis with microperforation.  No pneumoperitoneum or abscess cavity were present. Patient states she had a similar episode 5 years ago. She had a colonoscopy several years ago in [place]. She was admitted to the hospital for further evaluation treatment. Her pain is still present, but has not worsened.  Past Medical History  Diagnosis Date  . Diabetes mellitus without complication   . Hyperlipidemia   . Hypertension   . Fibromyalgia   . Diverticulitis     Past Surgical History  Procedure Laterality Date  . Knee surgery Left   . Cholecystectomy      Family History  Problem Relation Age of Onset  . Diabetes Mother   . Dementia Mother   . Hypertension Mother   . Alcohol abuse Father   . Cirrhosis Father   . Arthritis Sister     rheumatoid  . Diabetes Brother   . Hypertension Brother   . Diabetes Maternal Grandmother   . Fibromyalgia Sister   . Hypertension Sister   . Fibromyalgia Sister   . Hypertension Sister   . Diabetes Sister   . Hypertension Sister   . Cirrhosis Brother   . Cancer Brother     pancreatic    Social History:  reports that she quit smoking about 13 years ago. Her smoking use included Cigarettes. She has a 57 pack-year smoking history. She has never used smokeless tobacco. She reports that she drinks alcohol. She reports that she does not use illicit drugs.  Allergies:  Allergies  Allergen Reactions  . Bee Pollen Anaphylaxis and Swelling  . Codeine     REACTION: nauseated,claustrophobic    Medications: I have reviewed the patient's current medications.  Results for orders placed or performed during the hospital encounter of 11/08/14  (from the past 48 hour(s))  CBC with Differential     Status: None   Collection Time: 11/08/14  5:46 PM  Result Value Ref Range   WBC 8.6 4.0 - 10.5 K/uL   RBC 4.39 3.87 - 5.11 MIL/uL   Hemoglobin 13.7 12.0 - 15.0 g/dL   HCT 39.9 36.0 - 46.0 %   MCV 90.9 78.0 - 100.0 fL   MCH 31.2 26.0 - 34.0 pg   MCHC 34.3 30.0 - 36.0 g/dL   RDW 13.6 11.5 - 15.5 %   Platelets 206 150 - 400 K/uL   Neutrophils Relative % 65 43 - 77 %   Neutro Abs 5.6 1.7 - 7.7 K/uL   Lymphocytes Relative 28 12 - 46 %   Lymphs Abs 2.4 0.7 - 4.0 K/uL   Monocytes Relative 6 3 - 12 %   Monocytes Absolute 0.5 0.1 - 1.0 K/uL   Eosinophils Relative 1 0 - 5 %   Eosinophils Absolute 0.1 0.0 - 0.7 K/uL   Basophils Relative 0 0 - 1 %   Basophils Absolute 0.0 0.0 - 0.1 K/uL  Comprehensive metabolic panel     Status: Abnormal   Collection Time: 11/08/14  5:46 PM  Result Value Ref Range   Sodium 139 135 - 145 mmol/L   Potassium 3.9 3.5 - 5.1 mmol/L   Chloride 105 101 - 111 mmol/L   CO2 26 22 - 32 mmol/L  Glucose, Bld 161 (H) 65 - 99 mg/dL   BUN 24 (H) 6 - 20 mg/dL   Creatinine, Ser 1.02 (H) 0.44 - 1.00 mg/dL   Calcium 10.2 8.9 - 10.3 mg/dL   Total Protein 7.3 6.5 - 8.1 g/dL   Albumin 4.0 3.5 - 5.0 g/dL   AST 23 15 - 41 U/L   ALT 23 14 - 54 U/L   Alkaline Phosphatase 54 38 - 126 U/L   Total Bilirubin 0.3 0.3 - 1.2 mg/dL   GFR calc non Af Amer 59 (L) >60 mL/min   GFR calc Af Amer >60 >60 mL/min    Comment: (NOTE) The eGFR has been calculated using the CKD EPI equation. This calculation has not been validated in all clinical situations. eGFR's persistently <60 mL/min signify possible Chronic Kidney Disease.    Anion gap 8 5 - 15  Lipase, blood     Status: None   Collection Time: 11/08/14  5:46 PM  Result Value Ref Range   Lipase 30 22 - 51 U/L  Urinalysis, Routine w reflex microscopic (not at [facility])     Status: Abnormal   Collection Time: 11/08/14  7:15 PM  Result Value Ref Range   Color, Urine YELLOW YELLOW    APPearance CLEAR CLEAR   Specific Gravity, Urine 1.025 1.005 - 1.030   pH 6.0 5.0 - 8.0   Glucose, UA NEGATIVE NEGATIVE mg/dL   Hgb urine dipstick NEGATIVE NEGATIVE   Bilirubin Urine NEGATIVE NEGATIVE   Ketones, ur NEGATIVE NEGATIVE mg/dL   Protein, ur TRACE (A) NEGATIVE mg/dL   Urobilinogen, UA 0.2 0.0 - 1.0 mg/dL   Nitrite NEGATIVE NEGATIVE   Leukocytes, UA NEGATIVE NEGATIVE  Urine microscopic-add on     Status: None   Collection Time: 11/08/14  7:15 PM  Result Value Ref Range   Squamous Epithelial / LPF RARE RARE   WBC, UA 3-6 <3 WBC/hpf   RBC / HPF 0-2 <3 RBC/hpf   Bacteria, UA RARE RARE  I-Stat CG4 Lactic Acid, ED     Status: Abnormal   Collection Time: 11/08/14  7:31 PM  Result Value Ref Range   Lactic Acid, Venous 2.04 (HH) 0.5 - 2.0 mmol/L   Comment NOTIFIED PHYSICIAN   Comprehensive metabolic panel     Status: Abnormal   Collection Time: 11/09/14  6:50 AM  Result Value Ref Range   Sodium 139 135 - 145 mmol/L   Potassium 4.1 3.5 - 5.1 mmol/L   Chloride 105 101 - 111 mmol/L   CO2 28 22 - 32 mmol/L   Glucose, Bld 125 (H) 65 - 99 mg/dL   BUN 16 6 - 20 mg/dL   Creatinine, Ser 0.90 0.44 - 1.00 mg/dL   Calcium 9.2 8.9 - 10.3 mg/dL   Total Protein 6.6 6.5 - 8.1 g/dL   Albumin 3.4 (L) 3.5 - 5.0 g/dL   AST 21 15 - 41 U/L   ALT 22 14 - 54 U/L   Alkaline Phosphatase 48 38 - 126 U/L   Total Bilirubin 0.4 0.3 - 1.2 mg/dL   GFR calc non Af Amer >60 >60 mL/min   GFR calc Af Amer >60 >60 mL/min    Comment: (NOTE) The eGFR has been calculated using the CKD EPI equation. This calculation has not been validated in all clinical situations. eGFR's persistently <60 mL/min signify possible Chronic Kidney Disease.    Anion gap 6 5 - 15  CBC     Status: None   Collection Time:  11/09/14  6:50 AM  Result Value Ref Range   WBC 6.5 4.0 - 10.5 K/uL   RBC 3.97 3.87 - 5.11 MIL/uL   Hemoglobin 12.2 12.0 - 15.0 g/dL   HCT 36.1 36.0 - 46.0 %   MCV 90.9 78.0 - 100.0 fL   MCH 30.7 26.0 -  34.0 pg   MCHC 33.8 30.0 - 36.0 g/dL   RDW 13.6 11.5 - 15.5 %   Platelets 188 150 - 400 K/uL  Glucose, capillary     Status: Abnormal   Collection Time: 11/09/14  7:34 AM  Result Value Ref Range   Glucose-Capillary 121 (H) 65 - 99 mg/dL   Comment 1 Notify RN    Comment 2 Document in Chart     Ct Abdomen Pelvis W Contrast  11/08/2014   CLINICAL DATA:  Left lower quadrant abdominal pain beginning 2 days ago. Nausea. Personal history of diverticulitis.  EXAM: CT ABDOMEN AND PELVIS WITH CONTRAST  TECHNIQUE: Multidetector CT imaging of the abdomen and pelvis was performed using the standard protocol following bolus administration of intravenous contrast.  CONTRAST:  27m OMNIPAQUE IOHEXOL 300 MG/ML SOLN, 1076mOMNIPAQUE IOHEXOL 300 MG/ML SOLN  COMPARISON:  10/03/2007  FINDINGS: Lung bases are clear. No pleural or pericardial fluid. The liver shows mild diffuse fatty change. No focal lesion. Previous cholecystectomy. The spleen is normal. The pancreas is normal. The adrenal glands are normal. The kidneys are normal. There is advanced atherosclerosis of the aorta but no aneurysm. The IVC is normal. No retroperitoneal mass or lymphadenopathy.  There is acute diverticulitis of the sigmoid colon with pericolic edema in a few dots of extraluminal air. No evidence of drainable abscess or gross free intraperitoneal air. Bladder is normal. Uterine mass is again demonstrated, presumably leiomyoma, enlarged since the previous study, now measuring 6 cm in diameter.  IMPRESSION: Acute diverticulitis of the sigmoid colon with pericolic edema and a few dots of extraluminal air but no evidence of drainable abscess or gross free intraperitoneal air.  Atherosclerosis of the aorta and its branch vessels.  Fatty liver.   Electronically Signed   By: MaNelson Chimes.D.   On: 11/08/2014 20:26    ROS: See chart Blood pressure 168/86, pulse 87, temperature 98.2 F (36.8 C), temperature source Oral, resp. rate 18, height 5' 3"   (1.6 m), weight 82.555 kg (182 lb), SpO2 99 %. Physical Exam: Pleasant white female in no acute distress. Abdomen soft with tenderness in the left lower quadrant to palpation. No rigidity is noted.  Assessment/Plan: Impression: Sigmoid diverticulitis with microperforation Plan: Would continue IV antibiotics. Will start on clear liquid diet, but will advance diet as patient can tolerate. No need for acute surgical intervention. Management plans were discussed with the patient.  [name] A 11/09/2014, 7:41 AM

## 2014-11-09 NOTE — Progress Notes (Signed)
Needed the time to read 2300

## 2014-11-10 LAB — BASIC METABOLIC PANEL
Anion gap: 7 (ref 5–15)
BUN: 12 mg/dL (ref 6–20)
CO2: 28 mmol/L (ref 22–32)
Calcium: 9.2 mg/dL (ref 8.9–10.3)
Chloride: 104 mmol/L (ref 101–111)
Creatinine, Ser: 1 mg/dL (ref 0.44–1.00)
GFR calc Af Amer: >60 mL/min (ref >60)
GFR, EST NON AFRICAN AMERICAN: 60 mL/min — AB (ref >60)
Glucose, Bld: 140 mg/dL — ABNORMAL HIGH (ref 65–99)
Potassium: 3.7 mmol/L (ref 3.5–5.1)
Sodium: 139 mmol/L (ref 135–145)

## 2014-11-10 LAB — GLUCOSE, CAPILLARY
GLUCOSE-CAPILLARY: 117 mg/dL — AB (ref 65–99)
GLUCOSE-CAPILLARY: 135 mg/dL — AB (ref 65–99)
Glucose-Capillary: 125 mg/dL — ABNORMAL HIGH (ref 65–99)
Glucose-Capillary: 150 mg/dL — ABNORMAL HIGH (ref 65–99)

## 2014-11-10 LAB — HEMOGLOBIN A1C
Hgb A1c MFr Bld: 7.4 % — ABNORMAL HIGH (ref 4.8–5.6)
MEAN PLASMA GLUCOSE: 166 mg/dL

## 2014-11-10 MED ORDER — CIPROFLOXACIN HCL 500 MG PO TABS: 500.0000 mg | ORAL_TABLET | Freq: Two times a day (BID) | ORAL | Status: DC

## 2014-11-10 MED ORDER — HYDROCODONE-ACETAMINOPHEN 5-325 MG PO TABS: 1.0000 | ORAL_TABLET | ORAL | Status: DC | PRN

## 2014-11-10 MED ORDER — METRONIDAZOLE 500 MG PO TABS: 500.0000 mg | ORAL_TABLET | Freq: Three times a day (TID) | ORAL | Status: DC

## 2014-11-10 NOTE — Discharge Instructions (Signed)

## 2014-11-10 NOTE — Progress Notes (Signed)
Pt tolerated soft breakfast this AM with mild nausea that resolved without meds. Rates LLQ pain at 2/10 and denies need for pain meds at this time.

## 2014-11-10 NOTE — Discharge Summary (Signed)
Physician Discharge Summary  [name] ZOX:[id number] DOB: Jan 07, 1956 DOA: 11/08/2014  PCP: Pershing Proud  Admit date: 11/08/2014 Discharge date: 11/10/2014  Time spent: 40 minutes  Recommendations for Outpatient Follow-up:  1. Follow up with PCP 1 week for evaluation of sigmoid diverticulitis with microperforation.   Discharge Diagnoses:  Principal Problem:   Acute diverticulitis Active Problems:   Diabetes   Renal insufficiency   Abdominal pain   Hypertension   Discharge Condition: stable  Diet recommendation: soft  Filed Weights   11/08/14 1726 11/09/14 0615 11/10/14 0550  Weight: 81.194 kg (179 lb) 82.555 kg (182 lb) 81.602 kg (179 lb 14.4 oz)    History of present illness:  59 yo female with dm2, htn, hyperlipidemia, hx of diverticulitis presented to ED on 11/08/14 with  c/o left lower abd pain x2 days. Pain described as sharp, intermittent but worse on day of presentation.  + nausea, Pt noted that she ate almonds which may have triggered  diverticulitis. Pt denied fever, chills, emesis, diarrhea, brbpr, Omir Cooprider stool. CT scan showed sigmoid diverticulitis and some dots of extraluminal air. ED  consulted surgery regarding any input on possibly microperforation.    Hospital Course:  Abdominal pain secondary to diverticulitis with microperf. Per CT. Lipase within limits of normal. Admitted to medical floor provided with bowel rest and IV flagyl and cipro. She remained afebrile and non-toxic appearing. At discharge tolerating soft diet with minimal abdominal pain. Evaluated by general surgery who opine no surgical intervention warranted at this time. Discussed soft/bland/low residue diet with patient and counseled her to advance diet as tolerated slowly. Will discharge with cipro and flagyl complete 2 week course. Follow up with PCP 1 week for evaluation of resolution of diverticulitis   Renal insufficiency: related to decreased oral intake. Nephrotoxins held  and gentle iv fluid provided.  Resolved at discharge.   Dm2:  A1c 7.4. Oral agents held during hospitalization. Recommend close OP follow up with PCP for optimal control.    Procedures:  none  Consultations:  General surgery  Discharge Exam: Filed Vitals:   11/10/14 0550  BP: 160/69  Pulse: 100  Temp: 99 F (37.2 C)  Resp: 18    General: ambulating in room with steady gait, brushing teethj Cardiovascular: RRR no MGR no LE edema Respiratory: normal effort BS clear bilaterally no wheeze Abdomen: non-distended, soft +BS mild tenderness LLQ to palpation no guarding or rebounding  Discharge Instructions    Current Discharge Medication List    START taking these medications   Details  ciprofloxacin (CIPRO) 500 MG tablet Take 1 tablet (500 mg total) by mouth 2 (two) times daily. Qty: 24 tablet, Refills: 0    HYDROcodone-acetaminophen (NORCO/VICODIN) 5-325 MG per tablet Take 1 tablet by mouth every 4 (four) hours as needed for moderate pain. Qty: 15 tablet, Refills: 0    metroNIDAZOLE (FLAGYL) 500 MG tablet Take 1 tablet (500 mg total) by mouth 3 (three) times daily. Qty: 36 tablet, Refills: 0      CONTINUE these medications which have NOT CHANGED   Details  aspirin 81 MG tablet Take 81 mg by mouth daily.    cholestyramine (QUESTRAN) 4 GM/DOSE powder Take 4 g by mouth 3 (three) times daily.  Refills: 11    Coenzyme Q10 (COQ-10 PO) Take 1 capsule by mouth daily.    fenofibrate 160 MG tablet Take 160 mg by mouth daily.  Refills: 1    ibuprofen (ADVIL,MOTRIN) 800 MG tablet Take 800 mg by mouth every 8 (  eight) hours as needed for mild pain or moderate pain.  Refills: 1    KRILL OIL PO Take 1 capsule by mouth daily.    lisinopril-hydrochlorothiazide (PRINZIDE,ZESTORETIC) 20-12.5 MG per tablet Take 1 tablet by mouth daily. Refills: 4    metFORMIN (GLUCOPHAGE-XR) 500 MG 24 hr tablet Take 500 mg by mouth 2 (two) times daily.  Refills: 1    Misc Natural Products  (TART CHERRY ADVANCED) CAPS Take 1 capsule by mouth daily.    Probiotic Product (PROBIOTIC DAILY) CAPS Take 1 capsule by mouth daily.    simvastatin (ZOCOR) 20 MG tablet Take 20 mg by mouth daily.  Refills: 10    VOLTAREN 1 % GEL Apply 1 application topically daily as needed (for pain).  Refills: 2    zolpidem (AMBIEN) 10 MG tablet Take 10 mg by mouth at bedtime. Refills: 2       Allergies  Allergen Reactions  . Bee Pollen Anaphylaxis and Swelling  . Codeine     REACTION: nauseated,claustrophobic   Follow-up Information    Follow up with Terie Purser, PA-C.   Specialty:  Family Medicine   Contact information:   [address] [place] [address] [phone]        The results of significant diagnostics from this hospitalization (including imaging, microbiology, ancillary and laboratory) are listed below for reference.    Significant Diagnostic Studies: Ct Abdomen Pelvis W Contrast  11/08/2014   CLINICAL DATA:  Left lower quadrant abdominal pain beginning 2 days ago. Nausea. Personal history of diverticulitis.  EXAM: CT ABDOMEN AND PELVIS WITH CONTRAST  TECHNIQUE: Multidetector CT imaging of the abdomen and pelvis was performed using the standard protocol following bolus administration of intravenous contrast.  CONTRAST:  25mL OMNIPAQUE IOHEXOL 300 MG/ML SOLN, OMNIPAQUE IOHEXOL 300 MG/ML SOLN  COMPARISON:  10/03/2007  FINDINGS: Lung bases are clear. No pleural or pericardial fluid. The liver shows mild diffuse fatty change. No focal lesion. Previous cholecystectomy. The spleen is normal. The pancreas is normal. The adrenal glands are normal. The kidneys are normal. There is advanced atherosclerosis of the aorta but no aneurysm. The IVC is normal. No retroperitoneal mass or lymphadenopathy.  There is acute diverticulitis of the sigmoid colon with pericolic edema in a few dots of extraluminal air. No evidence of drainable abscess or gross free intraperitoneal air.  Bladder is normal. Uterine mass is again demonstrated, presumably leiomyoma, enlarged since the previous study, now measuring 6 cm in diameter.  IMPRESSION: Acute diverticulitis of the sigmoid colon with pericolic edema and a few dots of extraluminal air but no evidence of drainable abscess or gross free intraperitoneal air.  Atherosclerosis of the aorta and its branch vessels.  Fatty liver.   Electronically Signed   By: Paulina Fusi M.D.   On: 11/08/2014 20:26    Microbiology: No results found for this or any previous visit (from the past 240 hour(s)).   Labs: Basic Metabolic Panel:  Recent Labs Lab 11/08/14 1746 11/09/14 0650 11/10/14 0712  NA 139 139 139  K 3.9 4.1 3.7  CL 105 105 104  CO2 GLUCOSE 161* 125* 140*  BUN 24* 16 12  CREATININE 1.02* 0.90 1.00  CALCIUM 10.2 9.2 9.2   Liver Function Tests:  Recent Labs Lab 11/08/14 1746 11/09/14 0650  AST 23 21  ALT 23 22  ALKPHOS 54 48  BILITOT 0.3 0.4  PROT 7.3 6.6  ALBUMIN 4.0 3.4*    Recent Labs Lab 11/08/14 1746  LIPASE  30   No results for input(s): AMMONIA in the last 168 hours. CBC:  Recent Labs Lab 11/08/14 1746 11/09/14 0650  WBC 8.6 6.5  NEUTROABS 5.6  --   HGB 13.7 12.2  HCT 39.9 36.1  MCV 90.9 90.9  PLT 206 188   Cardiac Enzymes: No results for input(s): CKTOTAL, CKMB, CKMBINDEX, TROPONINI in the last 168 hours. BNP: BNP (last 3 results) No results for input(s): BNP in the last 8760 hours.  ProBNP (last 3 results) No results for input(s): PROBNP in the last 8760 hours.  CBG:  Recent Labs Lab 11/09/14 2135 11/10/14 0001 11/10/14 0410 11/10/14 0732 11/10/14 1154  GLUCAP 118* 135* 117* 125* 150*       Signed:  Alizey Noren M  [facility] 11/10/2014, 12:46 PM

## 2014-11-10 NOTE — Progress Notes (Signed)
Subjective: Patient feels better. She still some tenderness in the left lower quadrant.  Objective: Vital signs in last 24 hours: Temp:  [98.6 F (37 C)-99 F (37.2 C)] 99 F (37.2 C) (07/12 0550) Pulse Rate:  [93-100] 100 (07/12 0550) Resp:  [18] 18 (07/12 0550) BP: (153-168)/(69-72) 160/69 mmHg (07/12 0550) SpO2:  [96 %-98 %] 96 % (07/12 0550) Weight:  [81.602 kg (179 lb 14.4 oz)] 81.602 kg (179 lb 14.4 oz) (07/12 0550) Last BM Date: 11/08/14  Intake/Output from previous day: 07/11 0701 - 07/12 0700 In: 2975 [P.O.:720; I.V.:1455; IV Piggyback:800] Out: -  Intake/Output this shift:    General appearance: alert, cooperative and no distress GI: Soft. Tenderness left lower quadrant palpation. No rigidity is noted.  Lab Results:   Recent Labs  11/08/14 1746 11/09/14 0650  WBC 8.6 6.5  HGB 13.7 12.2  HCT 39.9 36.1  PLT 206 188   BMET  Recent Labs  11/09/14 0650 11/10/14 0712  NA 139 139  K 4.1 3.7  CL 105 104  CO2 28 28  GLUCOSE 125* 140*  BUN 16 12  CREATININE 0.90 1.00  CALCIUM 9.2 9.2   PT/INR No results for input(s): LABPROT, INR in the last 72 hours.  Studies/Results: Ct Abdomen Pelvis W Contrast  11/08/2014   CLINICAL DATA:  Left lower quadrant abdominal pain beginning 2 days ago. Nausea. Personal history of diverticulitis.  EXAM: CT ABDOMEN AND PELVIS WITH CONTRAST  TECHNIQUE: Multidetector CT imaging of the abdomen and pelvis was performed using the standard protocol following bolus administration of intravenous contrast.  CONTRAST:  25mL OMNIPAQUE IOHEXOL 300 MG/ML SOLN, 100mL OMNIPAQUE IOHEXOL 300 MG/ML SOLN  COMPARISON:  10/03/2007  FINDINGS: Lung bases are clear. No pleural or pericardial fluid. The liver shows mild diffuse fatty change. No focal lesion. Previous cholecystectomy. The spleen is normal. The pancreas is normal. The adrenal glands are normal. The kidneys are normal. There is advanced atherosclerosis of the aorta but no aneurysm. The  IVC is normal. No retroperitoneal mass or lymphadenopathy.  There is acute diverticulitis of the sigmoid colon with pericolic edema in a few dots of extraluminal air. No evidence of drainable abscess or gross free intraperitoneal air. Bladder is normal. Uterine mass is again demonstrated, presumably leiomyoma, enlarged since the previous study, now measuring 6 cm in diameter.  IMPRESSION: Acute diverticulitis of the sigmoid colon with pericolic edema and a few dots of extraluminal air but no evidence of drainable abscess or gross free intraperitoneal air.  Atherosclerosis of the aorta and its branch vessels.  Fatty liver.   Electronically Signed   By: Paulina FusiMark  Shogry M.D.   On: 11/08/2014 20:26    Anti-infectives: Anti-infectives    Start     Dose/Rate Route Frequency Ordered Stop   11/09/14 0900  ciprofloxacin (CIPRO) IVPB 400 mg     400 mg 200 mL/hr over 60 Minutes Intravenous Every 12 hours 11/08/14 2351     11/09/14 0600  metroNIDAZOLE (FLAGYL) IVPB 500 mg     500 mg 100 mL/hr over 60 Minutes Intravenous Every 8 hours 11/08/14 2351     11/08/14 2030  ciprofloxacin (CIPRO) IVPB 400 mg     400 mg 200 mL/hr over 60 Minutes Intravenous  Once 11/08/14 2029 11/08/14 2154   11/08/14 2030  metroNIDAZOLE (FLAGYL) IVPB 500 mg     500 mg 100 mL/hr over 60 Minutes Intravenous  Once 11/08/14 2029 11/08/14 2319      Assessment/Plan: Impression: Sigmoid diverticulitis with microperforation, resolving  Plan: Patient's diet has been advanced to a soft diet. No need for acute surgical intervention. Anticipate discharge in next 24-48 hours.  LOS: 2 days    Adolf Ormiston A 11/10/2014

## 2014-11-10 NOTE — Progress Notes (Signed)
[name] discharged Home per MD order.  Discharge instructions reviewed and discussed with the patient, all questions and concerns answered. Copy of instructions and scripts given to patient.    Medication List    TAKE these medications        aspirin 81 MG tablet  Take 81 mg by mouth daily.     cholestyramine 4 GM/DOSE powder  Commonly known as:  QUESTRAN  Take 4 g by mouth 3 (three) times daily.     ciprofloxacin 500 MG tablet  Commonly known as:  CIPRO  Take 1 tablet (500 mg total) by mouth 2 (two) times daily.     COQ-10 PO  Take 1 capsule by mouth daily.     fenofibrate 160 MG tablet  Take 160 mg by mouth daily.     HYDROcodone-acetaminophen 5-325 MG per tablet  Commonly known as:  NORCO/VICODIN  Take 1 tablet by mouth every 4 (four) hours as needed for moderate pain.     ibuprofen 800 MG tablet  Commonly known as:  ADVIL,MOTRIN  Take 800 mg by mouth every 8 (eight) hours as needed for mild pain or moderate pain.     KRILL OIL PO  Take 1 capsule by mouth daily.     lisinopril-hydrochlorothiazide 20-12.5 MG per tablet  Commonly known as:  PRINZIDE,ZESTORETIC  Take 1 tablet by mouth daily.     metFORMIN 500 MG 24 hr tablet  Commonly known as:  GLUCOPHAGE-XR  Take 500 mg by mouth 2 (two) times daily.     metroNIDAZOLE 500 MG tablet  Commonly known as:  FLAGYL  Take 1 tablet (500 mg total) by mouth 3 (three) times daily.     PROBIOTIC DAILY Caps  Take 1 capsule by mouth daily.     simvastatin 20 MG tablet  Commonly known as:  ZOCOR  Take 20 mg by mouth daily.     TART CHERRY ADVANCED Caps  Take 1 capsule by mouth daily.     VOLTAREN 1 % Gel  Generic drug:  diclofenac sodium  Apply 1 application topically daily as needed (for pain).     zolpidem 10 MG tablet  Commonly known as:  AMBIEN  Take 10 mg by mouth at bedtime.        Patients skin is clean, dry and intact, no evidence of skin break down. IV site discontinued and catheter remains  intact. Site without signs and symptoms of complications. Dressing and pressure applied.  Patient escorted to car via wheelchair,  no distress noted upon discharge.  [name] 11/10/2014 3:17 PM

## 2014-11-10 NOTE — Care Management Note (Signed)
Case Management Note  Patient Details  Name: [name] MRN: [id number] Date of Birth: 03/06/1956  Subjective/Objective:                    Action/Plan:   Expected Discharge Date:  11/11/14               Expected Discharge Plan:  Home/Self Care  In-House Referral:  NA  Discharge planning Services  CM Consult  Post Acute Care Choice:  NA Choice offered to:  NA  DME Arranged:    DME Agency:     HH Arranged:    HH Agency:     Status of Service:  Completed, signed off  Medicare Important Message Given:    Date Medicare IM Given:    Medicare IM give by:    Date Additional Medicare IM Given:    Additional Medicare Important Message give by:     If discussed at Long Length of Stay Meetings, dates discussed:    Additional Comments: Pt discharged home today. No CM needs noted. Arlyss QueenBlackwell, [place], RN 11/10/2014, 1:11 PM

## 2015-03-22 ENCOUNTER — Ambulatory Visit (INDEPENDENT_AMBULATORY_CARE_PROVIDER_SITE_OTHER): Payer: BLUE CROSS/BLUE SHIELD | Admitting: Adult Health

## 2015-03-22 ENCOUNTER — Encounter: Payer: Self-pay | Admitting: Adult Health

## 2015-03-22 VITALS — BP 150/90 | HR 86 | Ht 63.0 in | Wt 179.0 lb

## 2015-03-22 DIAGNOSIS — I10 Essential (primary) hypertension: Secondary | ICD-10-CM

## 2015-03-22 DIAGNOSIS — Z01419 Encounter for gynecological examination (general) (routine) without abnormal findings: Secondary | ICD-10-CM

## 2015-03-22 DIAGNOSIS — R238 Other skin changes: Secondary | ICD-10-CM

## 2015-03-22 DIAGNOSIS — Z1212 Encounter for screening for malignant neoplasm of rectum: Secondary | ICD-10-CM

## 2015-03-22 HISTORY — DX: Other skin changes: R23.8

## 2015-03-22 LAB — HEMOCCULT GUIAC POC 1CARD (OFFICE): FECAL OCCULT BLD: NEGATIVE

## 2015-03-22 MED ORDER — TRIAMCINOLONE ACETONIDE 0.5 % EX OINT: 1.0000 "application " | TOPICAL_OINTMENT | Freq: Two times a day (BID) | CUTANEOUS | Status: DC

## 2015-03-22 NOTE — Patient Instructions (Signed)
Physical in 1 year Mammogram yearly Labs with PCP Colonoscopy per GI 

## 2015-03-22 NOTE — Progress Notes (Signed)
Patient ID: [name], female   DOB: 10/14/1955, 59 y.o.   MRN: [id number] History of Present Illness: [name] is a 59 year old white female,married,G0P0 in for well woman gyn exam,she had a normal pap with negative HPV 03/19/14.She has area between cheeks that is sore at times. PCP is S.Jackson,PA at [place] [facility].  Current Medications, Allergies, Past Medical History, Past Surgical History, Family History and Social History were reviewed in [name].     Review of Systems: Patient denies any headaches, hearing loss, fatigue, blurred vision, shortness of breath, chest pain, abdominal pain, problems with bowel movements, urination, or intercourse(not having sex). No mood swings.Has body aches,got prednisone last week in left hip.    Physical Exam:BP 150/90 mmHg  Pulse 86  Ht 5\' 3"  (1.6 m)  Wt 179 lb (81.194 kg)  BMI 31.72 kg/m2 General:  Well developed, well nourished, no acute distress Skin:  Warm and dry Neck:  Midline trachea, normal thyroid, good ROM, no lymphadenopathy,no carotid bruits heard Lungs; Clear to auscultation bilaterally Breast:  No dominant palpable mass, retraction, or nipple discharge Cardiovascular: Regular rate and rhythm Abdomen:  Soft, non tender, no hepatosplenomegaly Pelvic:  External genitalia is normal in appearance, no lesions.  The vagina is pale with loss of color,moisture and rugae. Urethra has no lesions or masses. The cervix is smooth and atrophic.  Uterus is felt to be normal size, shape, and contour.  No adnexal masses or tenderness noted.Bladder is non tender, no masses felt. Rectal: Good sphincter tone, no polyps, or hemorrhoids felt.  Hemoccult negative.Has 1-2 mm sized lightly red papule in butt crack,is tender at times Extremities/musculoskeletal:  No swelling or varicosities noted, no clubbing or cyanosis Psych:  No mood changes, alert and cooperative,seems happy Discussed getting BP checked in 1 week wither here  or PCP.  Impression: Well woman gyn exam no pap Papule Hypertension     Plan: Rx triamcinolone ointment 0.5 % use bid prn  Recheck BP in 1 week Physical in 1 year Mammogram yearly Labs with PCP Colonoscopy per GI Refilled rx for lancets and test strips #30 with 1 refill,is changing machines soon.

## 2015-08-10 ENCOUNTER — Other Ambulatory Visit (HOSPITAL_COMMUNITY): Payer: Self-pay | Admitting: Family Medicine

## 2015-08-10 DIAGNOSIS — Z1231 Encounter for screening mammogram for malignant neoplasm of breast: Secondary | ICD-10-CM

## 2015-08-16 ENCOUNTER — Ambulatory Visit (HOSPITAL_COMMUNITY): Payer: BLUE CROSS/BLUE SHIELD

## 2015-08-16 ENCOUNTER — Ambulatory Visit (HOSPITAL_COMMUNITY)
Admission: RE | Admit: 2015-08-16 | Discharge: 2015-08-16 | Disposition: A | Discharge status: Home / Self Care | Payer: BLUE CROSS/BLUE SHIELD | Source: Ambulatory Visit | Attending: Family Medicine | Admitting: Family Medicine

## 2015-08-16 DIAGNOSIS — Z1231 Encounter for screening mammogram for malignant neoplasm of breast: Secondary | ICD-10-CM | POA: Insufficient documentation

## 2016-05-12 ENCOUNTER — Ambulatory Visit (INDEPENDENT_AMBULATORY_CARE_PROVIDER_SITE_OTHER): Payer: BLUE CROSS/BLUE SHIELD | Admitting: Rheumatology

## 2016-05-12 ENCOUNTER — Ambulatory Visit: Payer: BLUE CROSS/BLUE SHIELD | Admitting: Rheumatology

## 2016-05-12 ENCOUNTER — Encounter: Payer: Self-pay | Admitting: Rheumatology

## 2016-05-12 VITALS — BP 130/80 | HR 76 | Resp 14 | Ht 63.0 in | Wt 180.0 lb

## 2016-05-12 DIAGNOSIS — F5101 Primary insomnia: Secondary | ICD-10-CM

## 2016-05-12 DIAGNOSIS — M7061 Trochanteric bursitis, right hip: Secondary | ICD-10-CM | POA: Diagnosis not present

## 2016-05-12 DIAGNOSIS — M797 Fibromyalgia: Secondary | ICD-10-CM | POA: Diagnosis not present

## 2016-05-12 DIAGNOSIS — M461 Sacroiliitis, not elsewhere classified: Secondary | ICD-10-CM

## 2016-05-12 DIAGNOSIS — R5382 Chronic fatigue, unspecified: Secondary | ICD-10-CM | POA: Diagnosis not present

## 2016-05-12 DIAGNOSIS — M7062 Trochanteric bursitis, left hip: Secondary | ICD-10-CM | POA: Diagnosis not present

## 2016-05-12 MED ORDER — TRIAMCINOLONE ACETONIDE 40 MG/ML IJ SUSP
40.0000 mg | INTRAMUSCULAR | Status: AC | PRN
  Administered 2016-05-12: 40 mg via INTRA_ARTICULAR

## 2016-05-12 MED ORDER — DICLOFENAC SODIUM 1 % TD GEL: TRANSDERMAL | 1 refills | Status: DC

## 2016-05-12 MED ORDER — CYCLOBENZAPRINE HCL 10 MG PO TABS: 10.0000 mg | ORAL_TABLET | Freq: Every day | ORAL | 1 refills | Status: AC

## 2016-05-12 MED ORDER — LIDOCAINE HCL 1 % IJ SOLN
1.5000 mL | INTRAMUSCULAR | Status: AC | PRN
  Administered 2016-05-12: 1.5 mL

## 2016-05-12 MED ORDER — BACLOFEN 10 MG PO TABS: 10.0000 mg | ORAL_TABLET | Freq: Two times a day (BID) | ORAL | 1 refills | Status: AC | PRN

## 2016-05-12 NOTE — Patient Instructions (Signed)
Plan: #1: Try to exercise if tolerated. #2: Do iliotibial band exercises to minimize bilateral hip pain #3: Use medications as prescribed.  #4: Return to clinic in 6 months as scheduled.  ==== Iliotibial Band Syndrome Iliotibial band syndrome is pain in the outer, lower thigh. The pain is caused by an inflammation of the iliotibial band. This is a band of thick fibrous tissue that runs down the outside of the thigh. The iliotibial band begins at the hip. It extends to the outer side of the shin bone (tibia) just below the knee joint. The band works with the thigh muscles. Together they provide stability to the outside of the knee joint. Iliotibial band syndrome occurs when there is inflammation to this band of tissue. This is typically due to over use and not due to an injury. The irritation usually occurs over the outside of the knee joint, at the the end of the thigh bone (femur). The iliotibial band crosses bone and muscle at this point. Between these structures is a cushioning sac (bursa). The bursa should make possible a smooth gliding motion. However, when inflamed, the iliotibial band does not glide easily. When inflamed, there is pain with motion of the knee. Usually the pain worsens with continued movement and the pain goes away with rest. This problem usually arises when there is a sudden increase in sports activities involving your legs. Running, and playing soccer or basketball are examples of activities causing this. Others who are prone to iliotibial band syndrome include individuals with mechanical problems such as leg length differences, abnormality of walking, bowed legs etc. HOME CARE INSTRUCTIONS   Apply ice to the injured area:  Put ice in a plastic bag.  Place a towel between your skin and the bag.  Leave the ice on for 20 minutes, 2-3 times a day.  Limit excessive training or eliminate training until pain goes away.  While pain is present, you may use gentle range of  motion. Do not resume regular use until instructed by your health care provider. Begin use gradually. Do not increase activity to the point of pain. If pain does develop, decrease activity and continue the above measures. Gradually increase activities that do not cause discomfort. Do this until you finally achieve normal use.  Perform low-impact activities while pain is present. Wear proper footwear.  Only take over-the-counter or prescription medicines for pain, discomfort, or fever as directed by your health care provider. SEEK MEDICAL CARE IF:   Your pain increases or pain is not controlled with medications.  You develop new, unexplained symptoms, or an increase of the symptoms that brought you to your health care provider.  Your pain and symptoms are not improving or are getting worse. This information is not intended to replace advice given to you by your health care provider. Make sure you discuss any questions you have with your health care provider. Document Released: 10/07/2001 Document Revised: 05/08/2014 Document Reviewed: 11/14/2012 Elsevier Interactive Patient Education  2017 [employer]

## 2016-05-12 NOTE — Progress Notes (Signed)
Office Visit Note  Patient: [name]             Date of Birth: 07-Feb-1956           MRN: [id number]             PCP: Pershing Proud Referring: Avis Epley, PA* Visit Date: 05/12/2016 Occupation: @GUAROCC @    Subjective:  Pain of the Left Hip; Pain of the Lower Back; Pain of the Neck; and Muscle Pain (states having a fibromyalgia flare )   History of Present Illness: [name] is a 61 y.o. female   Complaining of bilateral SI joint and greater trochanter bursa pain. Patient would like to have injection and at least 2 of those 4 places.  Having flare of her fibromyalgia. She rates her discomfort as a 10 on a scale of 0-10. Also is having a lot of fatigue and just like last visit she rates her today's fatigue as a 10 on a scale of 10.  She is not very active and is trying to become more active  Activities of Daily Living:  Patient reports morning stiffness for 2 hours.   Patient Reports nocturnal pain.  Difficulty dressing/grooming: Reports Difficulty climbing stairs: Reports Difficulty getting out of chair: Reports Difficulty using hands for taps, buttons, cutlery, and/or writing: Reports   Review of Systems  Constitutional: Positive for fatigue.  HENT: Negative for mouth sores and mouth dryness.   Eyes: Negative for dryness.  Respiratory: Negative for shortness of breath.   Gastrointestinal: Negative for constipation and diarrhea.  Musculoskeletal: Positive for myalgias and myalgias.  Skin: Negative for sensitivity to sunlight.  Psychiatric/Behavioral: Positive for sleep disturbance. Negative for decreased concentration.    PMFS History:  Patient Active Problem List   Diagnosis Date Noted  . Papule 03/22/2015  . Hypertension   . Acute diverticulitis 11/08/2014  . Diabetes (HCC) 11/08/2014  . Renal insufficiency 11/08/2014  . Abdominal pain 11/08/2014  . Left leg weakness 04/10/2011  . Abnormal gait 04/10/2011  . S/P total knee  replacement 12/28/2010  . Knee pain 12/28/2010  . Knee stiffness 12/28/2010    Past Medical History:  Diagnosis Date  . Diabetes mellitus without complication (HCC)   . Diverticulitis   . Fibromyalgia   . Hyperlipidemia   . Hypertension   . Papule 03/22/2015    Family History  Problem Relation Age of Onset  . Diabetes Mother   . Dementia Mother   . Hypertension Mother   . Alcohol abuse Father   . Cirrhosis Father   . Arthritis Sister     rheumatoid  . Diabetes Brother   . Hypertension Brother   . Diabetes Maternal Grandmother   . Fibromyalgia Sister   . Hypertension Sister   . Fibromyalgia Sister   . Hypertension Sister   . Diabetes Sister   . Hypertension Sister   . Cirrhosis Brother   . Cancer Brother     pancreatic   Past Surgical History:  Procedure Laterality Date  . CHOLECYSTECTOMY    . KNEE SURGERY Left    Social History   Social History Narrative  . No narrative on file     Objective: Vital Signs: BP 130/80   Pulse 76   Resp 14   Ht 5\' 3"  (1.6 m)   Wt 180 lb (81.6 kg)   BMI 31.89 kg/m    Physical Exam  Constitutional: She is oriented to person, place, and time. She appears well-developed and well-nourished.  HENT:  Head: Normocephalic and atraumatic.  Eyes: EOM are normal. Pupils are equal, round, and reactive to light.  Cardiovascular: Normal rate, regular rhythm and normal heart sounds.  Exam reveals no gallop and no friction rub.   No murmur heard. Pulmonary/Chest: Effort normal and breath sounds normal. She has no wheezes. She has no rales.  Abdominal: Soft. Bowel sounds are normal. She exhibits no distension. There is no tenderness. There is no guarding. No hernia.  Musculoskeletal: Normal range of motion. She exhibits no edema, tenderness or deformity.  Lymphadenopathy:    She has no cervical adenopathy.  Neurological: She is alert and oriented to person, place, and time. Coordination normal.  Skin: Skin is warm and dry. Capillary  refill takes less than 2 seconds. No rash noted.  Psychiatric: She has a normal mood and affect. Her behavior is normal.  Nursing note and vitals reviewed.    Musculoskeletal Exam:  Full range of motion of all joints Grip strength is equal and strong bilaterally For myalgia tender points are 18 out of 18 positive. Patient has significant pain to the left SI joint and the left greater trochanter bursa and requires a shot today.  CDAI Exam: CDAI Homunculus Exam:   Joint Counts:  CDAI Tender Joint count: 0 CDAI Swollen Joint count: 0  Global Assessments:  Patient Global Assessment: 10 Provider Global Assessment: 10  CDAI Calculated Score: 20    Investigation: No additional findings.   Imaging: No results found.  Speciality Comments: No specialty comments available.    Procedures:  Large Joint Inj Date/Time: 05/12/2016 12:13 PM Performed by: Tawni PummelPANWALA, Jozy Mcphearson Authorized by: Tawni PummelPANWALA, Bannon Giammarco   Consent Given by:  Patient Site marked: the procedure site was marked   Timeout: prior to procedure the correct patient, procedure, and site was verified   Indications:  Pain Location:  Hip Site:  L hip joint Prep: patient was prepped and draped in usual sterile fashion   Needle Size:  27 G Needle Length:  1.5 inches Approach:  Posterior Ultrasound Guidance: No   Fluoroscopic Guidance: No   Arthrogram: No   Medications:  40 mg triamcinolone acetonide 40 MG/ML; 1.5 mL lidocaine 1 % Aspiration Attempted: Yes   Aspirate amount (mL):  0 Patient tolerance:  Patient tolerated the procedure well with no immediate complications  PATIENT TOLERATED PROCEDURE WELL. THERE WERE NO COMPLICATIONS.  Left SI joint was injected without complication. Pain was a 10 on a scale of 0-10 before the injection and 5 minutes later patient states that it's a 1 on a scale of 0-10. Large Joint Inj Date/Time: 05/12/2016 12:15 PM Performed by: Tawni PummelPANWALA, Tarvares Lant Authorized by: Tawni PummelPANWALA, Aylee Littrell   Consent  Given by:  Patient Site marked: the procedure site was marked   Timeout: prior to procedure the correct patient, procedure, and site was verified   Indications:  Pain Location:  Hip Site:  L greater trochanter Prep: patient was prepped and draped in usual sterile fashion   Needle Size:  27 G Approach:  Superior Ultrasound Guidance: No   Fluoroscopic Guidance: No   Arthrogram: No   Medications:  1.5 mL lidocaine 1 %; 40 mg triamcinolone acetonide 40 MG/ML Aspiration Attempted: Yes   Aspirate amount (mL):  0 Patient tolerance:  Patient tolerated the procedure well with no immediate complications  Left greater trochanter bursa injection. Patient tolerated procedure well. There were no complications. Patient rated her discomfort to the left greater trochanter as a 10 on a scale of 0-10. 5 minutes after  the injection patient states that her pain is down to a 1 on a scale of 0-10.   Allergies: Bee pollen and Codeine   Assessment / Plan:     Visit Diagnoses: Chronic fatigue  Fibromyalgia  Primary insomnia  Sacroiliitis, not elsewhere classified (HCC)  Greater trochanteric bursitis of both hips   Plan: #1: Fibromyalgia syndrome. Rates her pain as 10 on a scale of 0-10. #2: Fatigue. Rated 10 on a scale of 0-10. #3: History of insomnia. Does not get much sleep at night. Usually falls asleep about 3 or 4:00 in the morning.; Has a history of anxiety. Will discuss with PCP #4: Complaining of bilateral SI joint pain, history of same. #5: Complaining of bilateral greater trochanter bursa pain., History of same. #6: Patient is requesting cortisone injection to left SI joint and left greater trochanter bursa. She can only get 2 injections today. Her blood pressure is fine. Please see above procedure note for full details. #7: She can make an appointment for Monday if a slot is open to get injection in the right SI joint and the right greater trochanter bursa. In the meanwhile I've asked her  to use Voltaren gel to see if that will help her relieve some of her pain. #8: Refill meds, see Epic for details #9: We discussed in detail the proper way to use all of the medications that we've refilled for her today. #10: Return to clinic in 6 months   Orders: Orders Placed This Encounter  Procedures  . Large Joint Injection/Arthrocentesis  . Large Joint Injection/Arthrocentesis   Meds ordered this encounter  Medications  . cyclobenzaprine (FLEXERIL) 10 MG tablet    Sig: Take 1 tablet (10 mg total) by mouth at bedtime.    Dispense:  90 tablet    Refill:  1  . baclofen (LIORESAL) 10 MG tablet    Sig: Take 1 tablet (10 mg total) by mouth 2 (two) times daily as needed for muscle spasms.    Dispense:  180 each    Refill:  1    Order Specific Question:   Supervising Provider    Answer:   Pollyann Savoy [[id number]]  . diclofenac sodium (VOLTAREN) 1 % GEL    Sig: Apply 3 g to 3 large joints up to 3 times a day when necessary;  dispense 10 tubes with 1 refill    Dispense:  10 Tube    Refill:  1    Order Specific Question:   Supervising Provider    Answer:   Vanessa Kick    Face-to-face time spent with patient was 30 minutes. 50% of time was spent in counseling and coordination of care.  Follow-Up Instructions: Return in about 6 months (around 11/09/2016) for FMS, FATIGUE,INSOMNIA.   Tawni Pummel, PA-C  Note - This record has been created using [employer].  Chart creation errors have been sought, but may not always  have been located. Such creation errors do not reflect on  the standard of medical care.

## 2016-08-18 DIAGNOSIS — Z6832 Body mass index (BMI) 32.0-32.9, adult: Secondary | ICD-10-CM | POA: Diagnosis not present

## 2016-08-18 DIAGNOSIS — E1165 Type 2 diabetes mellitus with hyperglycemia: Secondary | ICD-10-CM | POA: Diagnosis not present

## 2016-08-18 DIAGNOSIS — E782 Mixed hyperlipidemia: Secondary | ICD-10-CM | POA: Diagnosis not present

## 2016-08-18 DIAGNOSIS — E6609 Other obesity due to excess calories: Secondary | ICD-10-CM | POA: Diagnosis not present

## 2016-08-18 DIAGNOSIS — Z1389 Encounter for screening for other disorder: Secondary | ICD-10-CM | POA: Diagnosis not present

## 2016-08-18 DIAGNOSIS — I10 Essential (primary) hypertension: Secondary | ICD-10-CM | POA: Diagnosis not present

## 2016-11-09 ENCOUNTER — Encounter: Payer: Self-pay | Admitting: Rheumatology

## 2016-11-09 ENCOUNTER — Ambulatory Visit (INDEPENDENT_AMBULATORY_CARE_PROVIDER_SITE_OTHER): Payer: Medicare Other | Admitting: Rheumatology

## 2016-11-09 VITALS — BP 144/100 | HR 84 | Resp 14 | Ht 63.0 in | Wt 186.0 lb

## 2016-11-09 DIAGNOSIS — F5101 Primary insomnia: Secondary | ICD-10-CM

## 2016-11-09 DIAGNOSIS — M797 Fibromyalgia: Secondary | ICD-10-CM

## 2016-11-09 DIAGNOSIS — R5382 Chronic fatigue, unspecified: Secondary | ICD-10-CM | POA: Diagnosis not present

## 2016-11-09 DIAGNOSIS — M7062 Trochanteric bursitis, left hip: Secondary | ICD-10-CM

## 2016-11-09 DIAGNOSIS — M7061 Trochanteric bursitis, right hip: Secondary | ICD-10-CM | POA: Diagnosis not present

## 2016-11-09 DIAGNOSIS — Z79899 Other long term (current) drug therapy: Secondary | ICD-10-CM

## 2016-11-09 DIAGNOSIS — M461 Sacroiliitis, not elsewhere classified: Secondary | ICD-10-CM

## 2016-11-09 NOTE — Patient Instructions (Signed)

## 2016-11-09 NOTE — Progress Notes (Signed)
Office Visit Note  Patient: [name]             Date of Birth: 11/15/1955           MRN: [id number]             PCP: Avis EpleyJackson, Samantha J, PA-C Referring: Avis EpleyJackson, Samantha J, [place]* Visit Date: 11/09/2016 Occupation: @GUAROCC @    Subjective:  No chief complaint on file.   History of Present Illness: [place] [employer] [name] is a 61 y.o. female  Last seen 05/12/2016 for fibromyalgia and medication management. On that visit, she was complaining of bilateral SI joint pain and greater trochanteric bursa pain. She requested cortisone injection for the management of this pain. Injections helped a lot.   Today, fms is doing well except having rt sj pain (c/w bursitis) x 1 months; left hand stiff . Fms pain rated 7 on 0-10 today; has been as high as 10 on some days.  Water aerobics is helping an  Activities of Daily Living:  Patient reports morning stiffness for 1 hour.   Patient Reports nocturnal pain.  Difficulty dressing/grooming: Reports Difficulty climbing stairs: Reports Difficulty getting out of chair: Denies Difficulty using hands for taps, buttons, cutlery, and/or writing: Reports   Review of Systems  Constitutional: Positive for fatigue.  HENT: Negative for mouth sores and mouth dryness.   Eyes: Negative for dryness.  Respiratory: Negative for shortness of breath.   Gastrointestinal: Negative for constipation and diarrhea.  Musculoskeletal: Positive for myalgias and myalgias.  Skin: Negative for sensitivity to sunlight.  Psychiatric/Behavioral: Positive for sleep disturbance. Negative for decreased concentration.    PMFS History:  Patient Active Problem List   Diagnosis Date Noted  . Papule 03/22/2015  . Hypertension   . Acute diverticulitis 11/08/2014  . Diabetes (HCC) 11/08/2014  . Renal insufficiency 11/08/2014  . Abdominal pain 11/08/2014  . Left leg weakness 04/10/2011  . Abnormal gait 04/10/2011  . S/P total knee replacement 12/28/2010  . Knee pain  12/28/2010  . Knee stiffness 12/28/2010    Past Medical History:  Diagnosis Date  . Diabetes mellitus without complication (HCC)   . Diverticulitis   . Fibromyalgia   . Hyperlipidemia   . Hypertension   . Papule 03/22/2015    Family History  Problem Relation Age of Onset  . Diabetes Mother   . Dementia Mother   . Hypertension Mother   . Alcohol abuse Father   . Cirrhosis Father   . Arthritis Sister        rheumatoid  . Diabetes Brother   . Hypertension Brother   . Diabetes Maternal Grandmother   . Fibromyalgia Sister   . Hypertension Sister   . Fibromyalgia Sister   . Hypertension Sister   . Diabetes Sister   . Hypertension Sister   . Cirrhosis Brother   . Cancer Brother        pancreatic   Past Surgical History:  Procedure Laterality Date  . CHOLECYSTECTOMY    . KNEE SURGERY Left    Social History   Social History Narrative  . No narrative on file     Objective: Vital Signs: BP (!) 144/100   Pulse 84   Resp 14   Ht 5\' 3"  (1.6 m)   Wt 186 lb (84.4 kg)   BMI 32.95 kg/m    Physical Exam  Constitutional: She is oriented to person, place, and time. She appears well-developed and well-nourished.  HENT:  Head: Normocephalic and atraumatic.  Eyes: Pupils  are equal, round, and reactive to light. EOM are normal.  Cardiovascular: Normal rate, regular rhythm and normal heart sounds.  Exam reveals no gallop and no friction rub.   No murmur heard. Pulmonary/Chest: Effort normal and breath sounds normal. She has no wheezes. She has no rales.  Abdominal: Soft. Bowel sounds are normal. She exhibits no distension. There is no tenderness. There is no guarding. No hernia.  Musculoskeletal: Normal range of motion. She exhibits no edema, tenderness or deformity.  Lymphadenopathy:    She has no cervical adenopathy.  Neurological: She is alert and oriented to person, place, and time. Coordination normal.  Skin: Skin is warm and dry. Capillary refill takes less than 2  seconds. No rash noted.  Psychiatric: She has a normal mood and affect. Her behavior is normal.  Nursing note and vitals reviewed.    Musculoskeletal Exam:  Full range of motion of all joints except right shoulder joint has difficulty being raised but she can do it. She is able to do more easier when she uses the opposite hand to raise her right shoulder. She is able to do approximately 100 of abduction on the right side and full abduction on the left side Grip strength is equal and strong bilaterally Fiber myalgia tender points are 18 over 18 positive  CDAI Exam: CDAI Homunculus Exam:   Joint Counts:  CDAI Tender Joint count: 0 CDAI Swollen Joint count: 0  Global Assessments:  Patient Global Assessment: 7 Provider Global Assessment: 7  CDAI Calculated Score: 14 No synovitis on examination Patient does have some peripheral edema to her ankles with left-sided little bit worse than the right side with no pitting edema   Investigation: No additional findings. No visits with results within 6 Month(s) from this visit.  Latest known visit with results is:  Office Visit on 03/22/2015  Component Date Value Ref Range Status  . Fecal Occult Blood, POC 03/22/2015 Negative  Negative Final   Patient will get labs done at her PCPs office. She requested a paper copy so that she can show to her PCP so she can get it done at their office.  Imaging: No results found.  Speciality Comments: No specialty comments available.    Procedures:  No procedures performed Allergies: Bee pollen and Codeine   Assessment / Plan:     Visit Diagnoses: Fibromyalgia - Plan: CBC with Differential/Platelet, COMPLETE METABOLIC PANEL WITH GFR  Chronic fatigue  Primary insomnia  Sacroiliitis, not elsewhere classified (HCC)  Greater trochanteric bursitis of both hips  Medication management - Plan: CBC with Differential/Platelet, COMPLETE METABOLIC PANEL WITH GFR    Plan: #1: Fibromyalgia  syndrome; active disease with generalized pain and 18 out of 18 tender points. Patient's current pain is rated 7 on a scale of 0-10  #2: Fatigue;   #3: Insomnia; uses Ambien to rest.  #4: Right shoulder joint bursitis We were interested in giving the patient an injection in her right shoulder joint for bursitis but patient's blood pressure was elevated at 169/71. Repeat manual blood pressure also showed elevated blood pressure at 140/100. Both these numbers make the patient higher risk for getting cortisone shot today. Patient understands and is agreeable and she will instead use Voltaren gel temporarily.  #5: Patient needs updated CBC with differential and CMP with GFR for her chart. She is going to get labs done at her PCPs office. She wants [place] to give her copy of the order so she can show them and have the labs  done. I printed up for her.  #6: Return to clinic in 6 months.  #7: In the future, I can write a prescription for the patient so she can go to the [facility] for water aerobics. I'm hopeful that they might be able to give her a discount since it's a medical necessity.  Orders: Orders Placed This Encounter  Procedures  . CBC with Differential/Platelet  . COMPLETE METABOLIC PANEL WITH GFR   No orders of the defined types were placed in this encounter.   Face-to-face time spent with patient was 30 minutes. Greater than 50% of time was spent in counseling and coordination of care.  Follow-Up Instructions: Return in about 6 months (around 05/12/2017) for FMS, FATIGUE,INSOMNIA.   Tawni Pummel, PA-C  Note - This record has been created using [employer].  Chart creation errors have been sought, but may not always  have been located. Such creation errors do not reflect on  the standard of medical care.

## 2016-12-29 DIAGNOSIS — E1165 Type 2 diabetes mellitus with hyperglycemia: Secondary | ICD-10-CM | POA: Diagnosis not present

## 2016-12-29 DIAGNOSIS — M3500 Sicca syndrome, unspecified: Secondary | ICD-10-CM | POA: Diagnosis not present

## 2016-12-29 DIAGNOSIS — E782 Mixed hyperlipidemia: Secondary | ICD-10-CM | POA: Diagnosis not present

## 2016-12-29 DIAGNOSIS — Z6833 Body mass index (BMI) 33.0-33.9, adult: Secondary | ICD-10-CM | POA: Diagnosis not present

## 2016-12-29 DIAGNOSIS — I10 Essential (primary) hypertension: Secondary | ICD-10-CM | POA: Diagnosis not present

## 2016-12-29 DIAGNOSIS — Z0001 Encounter for general adult medical examination with abnormal findings: Secondary | ICD-10-CM | POA: Diagnosis not present

## 2017-02-01 DIAGNOSIS — Z23 Encounter for immunization: Secondary | ICD-10-CM | POA: Diagnosis not present

## 2017-04-06 DIAGNOSIS — Z1389 Encounter for screening for other disorder: Secondary | ICD-10-CM | POA: Diagnosis not present

## 2017-04-06 DIAGNOSIS — Z6832 Body mass index (BMI) 32.0-32.9, adult: Secondary | ICD-10-CM | POA: Diagnosis not present

## 2017-04-06 DIAGNOSIS — E6609 Other obesity due to excess calories: Secondary | ICD-10-CM | POA: Diagnosis not present

## 2017-04-06 DIAGNOSIS — E1165 Type 2 diabetes mellitus with hyperglycemia: Secondary | ICD-10-CM | POA: Diagnosis not present

## 2017-07-12 DIAGNOSIS — E782 Mixed hyperlipidemia: Secondary | ICD-10-CM | POA: Diagnosis not present

## 2017-07-12 DIAGNOSIS — M797 Fibromyalgia: Secondary | ICD-10-CM | POA: Diagnosis not present

## 2017-07-12 DIAGNOSIS — I10 Essential (primary) hypertension: Secondary | ICD-10-CM | POA: Diagnosis not present

## 2017-07-12 DIAGNOSIS — Z1389 Encounter for screening for other disorder: Secondary | ICD-10-CM | POA: Diagnosis not present

## 2017-07-12 DIAGNOSIS — E1165 Type 2 diabetes mellitus with hyperglycemia: Secondary | ICD-10-CM | POA: Diagnosis not present

## 2017-07-12 DIAGNOSIS — E6609 Other obesity due to excess calories: Secondary | ICD-10-CM | POA: Diagnosis not present

## 2017-07-12 DIAGNOSIS — K219 Gastro-esophageal reflux disease without esophagitis: Secondary | ICD-10-CM | POA: Diagnosis not present

## 2017-07-12 DIAGNOSIS — Z6832 Body mass index (BMI) 32.0-32.9, adult: Secondary | ICD-10-CM | POA: Diagnosis not present

## 2017-07-19 DIAGNOSIS — J069 Acute upper respiratory infection, unspecified: Secondary | ICD-10-CM | POA: Diagnosis not present

## 2017-07-19 DIAGNOSIS — Z6832 Body mass index (BMI) 32.0-32.9, adult: Secondary | ICD-10-CM | POA: Diagnosis not present

## 2017-07-19 DIAGNOSIS — Z1389 Encounter for screening for other disorder: Secondary | ICD-10-CM | POA: Diagnosis not present

## 2017-07-19 DIAGNOSIS — E6609 Other obesity due to excess calories: Secondary | ICD-10-CM | POA: Diagnosis not present

## 2017-07-26 DIAGNOSIS — E119 Type 2 diabetes mellitus without complications: Secondary | ICD-10-CM | POA: Diagnosis not present

## 2017-08-02 DIAGNOSIS — E119 Type 2 diabetes mellitus without complications: Secondary | ICD-10-CM | POA: Diagnosis not present

## 2017-10-25 DIAGNOSIS — Z1389 Encounter for screening for other disorder: Secondary | ICD-10-CM | POA: Diagnosis not present

## 2017-10-25 DIAGNOSIS — E6609 Other obesity due to excess calories: Secondary | ICD-10-CM | POA: Diagnosis not present

## 2017-10-25 DIAGNOSIS — I10 Essential (primary) hypertension: Secondary | ICD-10-CM | POA: Diagnosis not present

## 2017-10-25 DIAGNOSIS — E1165 Type 2 diabetes mellitus with hyperglycemia: Secondary | ICD-10-CM | POA: Diagnosis not present

## 2017-10-25 DIAGNOSIS — Z6832 Body mass index (BMI) 32.0-32.9, adult: Secondary | ICD-10-CM | POA: Diagnosis not present

## 2017-10-25 DIAGNOSIS — E782 Mixed hyperlipidemia: Secondary | ICD-10-CM | POA: Diagnosis not present

## 2017-11-22 DIAGNOSIS — T50905S Adverse effect of unspecified drugs, medicaments and biological substances, sequela: Secondary | ICD-10-CM | POA: Diagnosis not present

## 2017-11-22 DIAGNOSIS — R69 Illness, unspecified: Secondary | ICD-10-CM | POA: Diagnosis not present

## 2017-11-22 DIAGNOSIS — E6609 Other obesity due to excess calories: Secondary | ICD-10-CM | POA: Diagnosis not present

## 2017-11-22 DIAGNOSIS — Z6832 Body mass index (BMI) 32.0-32.9, adult: Secondary | ICD-10-CM | POA: Diagnosis not present

## 2017-11-22 DIAGNOSIS — R42 Dizziness and giddiness: Secondary | ICD-10-CM | POA: Diagnosis not present

## 2017-11-22 DIAGNOSIS — E109 Type 1 diabetes mellitus without complications: Secondary | ICD-10-CM | POA: Diagnosis not present

## 2017-11-23 DIAGNOSIS — R69 Illness, unspecified: Secondary | ICD-10-CM | POA: Diagnosis not present

## 2017-11-25 ENCOUNTER — Other Ambulatory Visit: Payer: Self-pay

## 2017-11-25 ENCOUNTER — Inpatient Hospital Stay (HOSPITAL_COMMUNITY)
Admission: EM | Admit: 2017-11-25 | Discharge: 2017-11-27 | DRG: 065 | Disposition: A | Discharge status: Skilled Nursing Facility | Payer: Medicare HMO | Attending: Internal Medicine | Admitting: Internal Medicine

## 2017-11-25 ENCOUNTER — Emergency Department (HOSPITAL_COMMUNITY): Payer: Medicare HMO

## 2017-11-25 ENCOUNTER — Encounter (HOSPITAL_COMMUNITY): Payer: Self-pay | Admitting: Emergency Medicine

## 2017-11-25 DIAGNOSIS — R471 Dysarthria and anarthria: Secondary | ICD-10-CM | POA: Diagnosis not present

## 2017-11-25 DIAGNOSIS — R296 Repeated falls: Secondary | ICD-10-CM | POA: Diagnosis present

## 2017-11-25 DIAGNOSIS — Z96652 Presence of left artificial knee joint: Secondary | ICD-10-CM | POA: Diagnosis present

## 2017-11-25 DIAGNOSIS — E1165 Type 2 diabetes mellitus with hyperglycemia: Secondary | ICD-10-CM | POA: Diagnosis present

## 2017-11-25 DIAGNOSIS — E1151 Type 2 diabetes mellitus with diabetic peripheral angiopathy without gangrene: Secondary | ICD-10-CM | POA: Diagnosis present

## 2017-11-25 DIAGNOSIS — R2990 Unspecified symptoms and signs involving the nervous system: Secondary | ICD-10-CM | POA: Diagnosis present

## 2017-11-25 DIAGNOSIS — E785 Hyperlipidemia, unspecified: Secondary | ICD-10-CM | POA: Diagnosis present

## 2017-11-25 DIAGNOSIS — M797 Fibromyalgia: Secondary | ICD-10-CM | POA: Diagnosis present

## 2017-11-25 DIAGNOSIS — I5030 Unspecified diastolic (congestive) heart failure: Secondary | ICD-10-CM | POA: Diagnosis not present

## 2017-11-25 DIAGNOSIS — R531 Weakness: Secondary | ICD-10-CM | POA: Diagnosis not present

## 2017-11-25 DIAGNOSIS — K219 Gastro-esophageal reflux disease without esophagitis: Secondary | ICD-10-CM | POA: Diagnosis not present

## 2017-11-25 DIAGNOSIS — I639 Cerebral infarction, unspecified: Secondary | ICD-10-CM | POA: Diagnosis not present

## 2017-11-25 DIAGNOSIS — Z87891 Personal history of nicotine dependence: Secondary | ICD-10-CM | POA: Diagnosis not present

## 2017-11-25 DIAGNOSIS — Z79899 Other long term (current) drug therapy: Secondary | ICD-10-CM | POA: Diagnosis not present

## 2017-11-25 DIAGNOSIS — R29810 Facial weakness: Secondary | ICD-10-CM | POA: Diagnosis present

## 2017-11-25 DIAGNOSIS — Z7984 Long term (current) use of oral hypoglycemic drugs: Secondary | ICD-10-CM | POA: Diagnosis not present

## 2017-11-25 DIAGNOSIS — Z833 Family history of diabetes mellitus: Secondary | ICD-10-CM

## 2017-11-25 DIAGNOSIS — Z7982 Long term (current) use of aspirin: Secondary | ICD-10-CM

## 2017-11-25 DIAGNOSIS — R262 Difficulty in walking, not elsewhere classified: Secondary | ICD-10-CM | POA: Diagnosis not present

## 2017-11-25 DIAGNOSIS — I69392 Facial weakness following cerebral infarction: Secondary | ICD-10-CM | POA: Diagnosis not present

## 2017-11-25 DIAGNOSIS — E039 Hypothyroidism, unspecified: Secondary | ICD-10-CM | POA: Diagnosis present

## 2017-11-25 DIAGNOSIS — F329 Major depressive disorder, single episode, unspecified: Secondary | ICD-10-CM | POA: Diagnosis present

## 2017-11-25 DIAGNOSIS — Z8673 Personal history of transient ischemic attack (TIA), and cerebral infarction without residual deficits: Secondary | ICD-10-CM | POA: Diagnosis not present

## 2017-11-25 DIAGNOSIS — G8191 Hemiplegia, unspecified affecting right dominant side: Secondary | ICD-10-CM | POA: Diagnosis present

## 2017-11-25 DIAGNOSIS — I10 Essential (primary) hypertension: Secondary | ICD-10-CM | POA: Diagnosis not present

## 2017-11-25 DIAGNOSIS — Z720 Tobacco use: Secondary | ICD-10-CM | POA: Diagnosis present

## 2017-11-25 DIAGNOSIS — I69351 Hemiplegia and hemiparesis following cerebral infarction affecting right dominant side: Secondary | ICD-10-CM | POA: Diagnosis not present

## 2017-11-25 DIAGNOSIS — R2689 Other abnormalities of gait and mobility: Secondary | ICD-10-CM | POA: Diagnosis not present

## 2017-11-25 DIAGNOSIS — F32A Depression, unspecified: Secondary | ICD-10-CM | POA: Diagnosis present

## 2017-11-25 DIAGNOSIS — R279 Unspecified lack of coordination: Secondary | ICD-10-CM | POA: Diagnosis not present

## 2017-11-25 DIAGNOSIS — I6623 Occlusion and stenosis of bilateral posterior cerebral arteries: Secondary | ICD-10-CM | POA: Diagnosis not present

## 2017-11-25 DIAGNOSIS — R29705 NIHSS score 5: Secondary | ICD-10-CM | POA: Diagnosis present

## 2017-11-25 DIAGNOSIS — I69391 Dysphagia following cerebral infarction: Secondary | ICD-10-CM | POA: Diagnosis not present

## 2017-11-25 DIAGNOSIS — R69 Illness, unspecified: Secondary | ICD-10-CM | POA: Diagnosis not present

## 2017-11-25 DIAGNOSIS — M6281 Muscle weakness (generalized): Secondary | ICD-10-CM | POA: Diagnosis not present

## 2017-11-25 DIAGNOSIS — R1310 Dysphagia, unspecified: Secondary | ICD-10-CM | POA: Diagnosis present

## 2017-11-25 DIAGNOSIS — I69322 Dysarthria following cerebral infarction: Secondary | ICD-10-CM | POA: Diagnosis not present

## 2017-11-25 DIAGNOSIS — Z794 Long term (current) use of insulin: Secondary | ICD-10-CM | POA: Diagnosis not present

## 2017-11-25 LAB — ETHANOL

## 2017-11-25 LAB — URINALYSIS, ROUTINE W REFLEX MICROSCOPIC
Bacteria, UA: NONE SEEN
Bilirubin Urine: NEGATIVE
Glucose, UA: >=500 mg/dL — AB
Hgb urine dipstick: NEGATIVE
Ketones, ur: 5 mg/dL — AB
Leukocytes, UA: NEGATIVE
Nitrite: NEGATIVE
Protein, ur: >=300 mg/dL — AB
SPECIFIC GRAVITY, URINE: 1.02 (ref 1.005–1.030)
pH: 6 (ref 5.0–8.0)

## 2017-11-25 LAB — COMPREHENSIVE METABOLIC PANEL
ALBUMIN: 3.4 g/dL — AB (ref 3.5–5.0)
ALT: 34 U/L (ref 0–44)
AST: 35 U/L (ref 15–41)
Alkaline Phosphatase: 87 U/L (ref 38–126)
Anion gap: 11 (ref 5–15)
BUN: 18 mg/dL (ref 8–23)
CHLORIDE: 105 mmol/L (ref 98–111)
CO2: 21 mmol/L — ABNORMAL LOW (ref 22–32)
Calcium: 9.6 mg/dL (ref 8.9–10.3)
Creatinine, Ser: 0.87 mg/dL (ref 0.44–1.00)
GFR calc Af Amer: >60 mL/min (ref >60)
Glucose, Bld: 308 mg/dL — ABNORMAL HIGH (ref 70–99)
POTASSIUM: 3.7 mmol/L (ref 3.5–5.1)
SODIUM: 137 mmol/L (ref 135–145)
Total Bilirubin: 0.6 mg/dL (ref 0.3–1.2)
Total Protein: 7.2 g/dL (ref 6.5–8.1)

## 2017-11-25 LAB — DIFFERENTIAL
BASOS ABS: 0 10*3/uL (ref 0.0–0.1)
BASOS PCT: 0 %
EOS ABS: 0.1 10*3/uL (ref 0.0–0.7)
Eosinophils Relative: 1 %
Lymphocytes Relative: 40 %
Lymphs Abs: 2.6 10*3/uL (ref 0.7–4.0)
MONOS PCT: 9 %
Monocytes Absolute: 0.6 10*3/uL (ref 0.1–1.0)
NEUTROS PCT: 50 %
Neutro Abs: 3.3 10*3/uL (ref 1.7–7.7)

## 2017-11-25 LAB — CBC
HEMATOCRIT: 41.3 % (ref 36.0–46.0)
HEMOGLOBIN: 14 g/dL (ref 12.0–15.0)
MCH: 31 pg (ref 26.0–34.0)
MCHC: 33.9 g/dL (ref 30.0–36.0)
MCV: 91.4 fL (ref 78.0–100.0)
PLATELETS: 202 10*3/uL (ref 150–400)
RBC: 4.52 MIL/uL (ref 3.87–5.11)
RDW: 13.9 % (ref 11.5–15.5)
WBC: 6.5 10*3/uL (ref 4.0–10.5)

## 2017-11-25 LAB — GLUCOSE, CAPILLARY
GLUCOSE-CAPILLARY: 165 mg/dL — AB (ref 70–99)
Glucose-Capillary: 349 mg/dL — ABNORMAL HIGH (ref 70–99)
Glucose-Capillary: 182 mg/dL — ABNORMAL HIGH (ref 70–99)
Glucose-Capillary: 157 mg/dL — ABNORMAL HIGH (ref 70–99)

## 2017-11-25 LAB — RAPID URINE DRUG SCREEN, HOSP PERFORMED
Amphetamines: NOT DETECTED
BARBITURATES: NOT DETECTED
BENZODIAZEPINES: NOT DETECTED
COCAINE: NOT DETECTED
Opiates: NOT DETECTED
Tetrahydrocannabinol: NOT DETECTED

## 2017-11-25 LAB — I-STAT TROPONIN, ED: TROPONIN I, POC: 0 ng/mL (ref 0.00–0.08)

## 2017-11-25 LAB — PROTIME-INR
INR: 1.03
Prothrombin Time: 13.4 seconds (ref 11.4–15.2)

## 2017-11-25 LAB — HEMOGLOBIN A1C
Hgb A1c MFr Bld: 9.6 % — ABNORMAL HIGH (ref 4.8–5.6)
Mean Plasma Glucose: 228.82 mg/dL

## 2017-11-25 LAB — TSH: TSH: 8.579 u[IU]/mL — ABNORMAL HIGH (ref 0.350–4.500)

## 2017-11-25 LAB — APTT: APTT: 27 s (ref 24–36)

## 2017-11-25 MED ORDER — STROKE: EARLY STAGES OF RECOVERY BOOK
Freq: Once | Status: AC
  Administered 2017-11-25: 19:00:00
  Filled 2017-11-25: qty 1

## 2017-11-25 MED ORDER — ACETAMINOPHEN 160 MG/5ML PO SOLN: 650.0000 mg | ORAL | Status: DC | PRN

## 2017-11-25 MED ORDER — HYDRALAZINE HCL 20 MG/ML IJ SOLN
INTRAMUSCULAR | Status: AC
  Administered 2017-11-25: 10 mg via INTRAVENOUS
  Filled 2017-11-25: qty 1

## 2017-11-25 MED ORDER — HYDRALAZINE HCL 20 MG/ML IJ SOLN
10.0000 mg | Freq: Three times a day (TID) | INTRAMUSCULAR | Status: DC | PRN
  Administered 2017-11-25 – 2017-11-26 (×4): 10 mg via INTRAVENOUS
  Filled 2017-11-25 (×3): qty 1

## 2017-11-25 MED ORDER — INSULIN ASPART 100 UNIT/ML ~~LOC~~ SOLN
0.0000 [IU] | Freq: Four times a day (QID) | SUBCUTANEOUS | Status: DC
  Administered 2017-11-25: 2 [IU] via SUBCUTANEOUS
  Administered 2017-11-25: 7 [IU] via SUBCUTANEOUS
  Administered 2017-11-25: 2 [IU] via SUBCUTANEOUS
  Administered 2017-11-26: 5 [IU] via SUBCUTANEOUS
  Administered 2017-11-26 (×3): 3 [IU] via SUBCUTANEOUS
  Administered 2017-11-27: 5 [IU] via SUBCUTANEOUS
  Administered 2017-11-27: 3 [IU] via SUBCUTANEOUS

## 2017-11-25 MED ORDER — FAMOTIDINE IN NACL 20-0.9 MG/50ML-% IV SOLN
20.0000 mg | INTRAVENOUS | Status: DC
  Administered 2017-11-25 – 2017-11-26 (×2): 20 mg via INTRAVENOUS
  Filled 2017-11-25 (×2): qty 50

## 2017-11-25 MED ORDER — SODIUM CHLORIDE 0.9 % IV SOLN
INTRAVENOUS | Status: DC
  Administered 2017-11-25 – 2017-11-26 (×2): via INTRAVENOUS

## 2017-11-25 MED ORDER — ASPIRIN 325 MG PO TABS
325.0000 mg | ORAL_TABLET | Freq: Every day | ORAL | Status: DC
  Administered 2017-11-27: 325 mg via ORAL
  Filled 2017-11-25: qty 1

## 2017-11-25 MED ORDER — NICOTINE 21 MG/24HR TD PT24
21.0000 mg | MEDICATED_PATCH | Freq: Every day | TRANSDERMAL | Status: DC
  Administered 2017-11-26 – 2017-11-27 (×2): 21 mg via TRANSDERMAL
  Filled 2017-11-25 (×2): qty 1

## 2017-11-25 MED ORDER — ONDANSETRON HCL 4 MG/2ML IJ SOLN: 4.0000 mg | Freq: Four times a day (QID) | INTRAMUSCULAR | Status: DC | PRN

## 2017-11-25 MED ORDER — ACETAMINOPHEN 650 MG RE SUPP: 650.0000 mg | RECTAL | Status: DC | PRN

## 2017-11-25 MED ORDER — ASPIRIN 300 MG RE SUPP
300.0000 mg | Freq: Every day | RECTAL | Status: DC
  Administered 2017-11-25 – 2017-11-26 (×2): 300 mg via RECTAL
  Filled 2017-11-25 (×2): qty 1

## 2017-11-25 MED ORDER — ACETAMINOPHEN 325 MG PO TABS: 650.0000 mg | ORAL_TABLET | ORAL | Status: DC | PRN

## 2017-11-25 MED ORDER — HEPARIN SODIUM (PORCINE) 5000 UNIT/ML IJ SOLN
5000.0000 [IU] | Freq: Three times a day (TID) | INTRAMUSCULAR | Status: DC
  Administered 2017-11-25 – 2017-11-27 (×6): 5000 [IU] via SUBCUTANEOUS
  Filled 2017-11-25 (×6): qty 1

## 2017-11-25 NOTE — ED Triage Notes (Signed)
Patient started having right-sided weakness starting yesterday around 10 am, didn't come to hospital because she thought it would get better, this morning having slurred speech and right arm and right leg weakness.

## 2017-11-25 NOTE — Consult Note (Signed)
[facility] TeleNeurology Consult Services   STAT Neurology Consult   Chief Complaint: Right sided weakness  Date of service: 11/25/2017   HPI: Asked to see this patient in telemedicine consultation. ?Consultation was performed with assistance of ancillary / medical staff at bedside.   Verbal consent to perform the examination with telemedicine was obtained. Patient and family agreed to proceed with the consultation.  62 year old right-handed white female who was brought to the emergency room for worsening right-sided weakness.  Patient's husband is at bedside.  Patient does not take any aspirin at baseline.  She has never had a stroke before.    She states that starting around Saturday morning, she woke up around 6 AM with right-sided weakness.  This progressed throughout most of the day.  Then this morning, her weakness did not improve and she decided to come to the emergency room for further evaluation.    PMH: Diabetes mellitus, hypertension, hyperlipidemia, and fibromyalgia   SOC: Negative x2.  Patient drinks socially.  She smoked before in the past.  She is married.   FMH: Significant for hypertension, diabetes mellitus, dementia, cirrhosis, and pancreatic cancer   ROS: 13 point review systems were reviewed with the patient and family, and are all negative with the exception of the aforementioned in the history of present illness.   VS: Respiration 18, pulse 88, blood pressure 195/85, oxygen saturation 97%   Exam: Patient is in no apparent distress.  Patient appears as stated age.  No obvious acute respiratory or cardiac distress.  Patient is well groomed and well-nourished. 1a- LOC: Keenly responsive - 0     1b- LOC questions: Answers both questions correctly - 0    1c- LOC commands- Performs both tasks correctly- 0    2- Gaze: Normal; no gaze paresis or gaze deviation - 0    3- Visual Fields: normal, no Visual field deficit - 0    4- Facial movements: right facial palsy - 1     5- Upper limb motor - right arm drift - 4    6- Lower limb motor - right leg drift - 4     7- Limb Coordination: absent ataxia - 0     8- Sensory: no sensory loss - 0     9- Language - No aphasia - 0     10- Speech - Mild dysarthria - 1    11- Neglect / Extinction - none found - 0   NIHSS score: 10    Diagnostic Data: CT of the head showed a possible subacute left subcortical white matter stroke.  No large territory stroke or acute hemorrhage.  Troponin negative x1, coagulation studies negative and within normal limits, WBC 6.5, hemoglobin 14, platelets 202, sodium 137, potassium 3.7, BUN 18, creatinine 0.87, blood glucose 308   Medical Data Reviewed:   1.Data?reviewed include clinical labs, radiology,?and medical tests;   2.Tests?results discussed w/performing or interpreting physician;   3.Obtaining/reviewing old medical records;   4.Obtaining?case history from another source;   5.Independent?review of image, tracing, or specimen.    Medical Decision Making:   - Extensive number of diagnosis or management options are considered below.   - Extensive amount of complex data reviewed.   - High risk of complication and/or morbidity or mortality are associated with differential diagnostic considerations below.   - There may be?uncertain?outcome and increased probability of prolonged functional impairment or high probability of severe prolonged functional impairment associated with some of these differential diagnosis.    Differential Diagnosis  for Stroke:   1.?Cardioembolic?stroke   2. Small vessel disease/lacune   3. Thromboembolic, artery-to-artery mechanism   4.?Hypercoagulable?state-related infarct   5. Transient ischemic attack   6. Thrombotic mechanism, large artery disease    Assessment: 1.  Acute left subcortical stroke due to chronic small vessel disease 2.  Hypertension 3.  Hyperlipidemia 4.  Diabetes mellitus 5.  Fibromyalgia   Recommendations: Patient can be  admitted to the hospital for further stroke work-up. Allow permissive hypertension and can treat for systolic blood pressure greater than 200 and/or diastolic blood pressure greater than 100. Can check nonemergent MRI brain without contrast to confirm her acute stroke. Check MRA of the head and neck to evaluate her intracranial and extracranial blood vessels.   Check echocardiogram to gauge her cardiac function. Maintain the patient on telemetry to look for paroxysmal atrial fibrillation. Check hemoglobin A1c and lipid panel. Consult PT, OT, ST. Consult local neurology team to assist with evaluation and management. Continue supportive care. Plan of care was discussed with the patient and her husband.   Thank you for allowing [facility] to participate in the care of your patient. Please call me, Dr. Adrienne Mocha, with any questions at [phone]. Case discussed with the ER staff and Dr. Preston Fleeting.

## 2017-11-25 NOTE — ED Provider Notes (Signed)
[facility] EMERGENCY DEPARTMENT Provider Note   CSN: [id number] Arrival date & time: 11/25/17  [id number]     History   Chief Complaint Chief Complaint  Patient presents with  . Weakness    HPI [name] is a 62 y.o. female.  The history is provided by the patient.  Weakness   She has history of hypertension, diabetes, hyperlipidemia, renal insufficiency and comes in complaining of right-sided weakness.  She was last able to use her right side normally day before yesterday.  She noted moderate weakness yesterday but thought it would get better.  Today, she is unable to move her right side at all.  She denies headache.  She has noted some difficulty speaking.  She denies chest pain.  There is no nausea or vomiting.  Of note, she was recently started on gabapentin and has had several falls.  Past Medical History:  Diagnosis Date  . Diabetes mellitus without complication (HCC)   . Diverticulitis   . Fibromyalgia   . Hyperlipidemia   . Hypertension   . Papule 03/22/2015    Patient Active Problem List   Diagnosis Date Noted  . Papule 03/22/2015  . Hypertension   . Acute diverticulitis 11/08/2014  . Diabetes (HCC) 11/08/2014  . Renal insufficiency 11/08/2014  . Abdominal pain 11/08/2014  . Left leg weakness 04/10/2011  . Abnormal gait 04/10/2011  . S/P total knee replacement 12/28/2010  . Knee pain 12/28/2010  . Knee stiffness 12/28/2010    Past Surgical History:  Procedure Laterality Date  . CHOLECYSTECTOMY    . KNEE SURGERY Left      OB History    Gravida  0   Para  0   Term  0   Preterm  0   AB  0   Living  0     SAB  0   TAB  0   Ectopic  0   Multiple  0   Live Births               Home Medications    Prior to Admission medications   Medication Sig Start Date End Date Taking? Authorizing Provider  amlodipine-benazepril (LOTREL) 2.5-10 MG capsule  05/02/16   [provider]  aspirin 81 MG tablet Take 81 mg by mouth  daily.    [provider]  diazepam (VALIUM) 2 MG tablet  08/18/16   [provider]  diclofenac sodium (VOLTAREN) 1 % GEL Apply 3 g to 3 large joints up to 3 times a day when necessary;  dispense 10 tubes with 1 refill 05/12/16   Panwala, Naitik, PA-C  escitalopram (LEXAPRO) 10 MG tablet  05/02/16   [provider]  fenofibrate 160 MG tablet Take 160 mg by mouth daily.  11/06/14   [provider]  FIBER PO Take by mouth. Chews 2 in the am    [provider]  glipiZIDE (GLUCOTROL) 10 MG tablet  10/27/16   [provider]  meloxicam (MOBIC) 15 MG tablet  11/07/16   [provider]  meloxicam (MOBIC) 7.5 MG tablet  04/12/16   [provider]  metFORMIN (GLUCOPHAGE) 1000 MG tablet  10/18/16   [provider]  metFORMIN (GLUCOPHAGE-XR) 500 MG 24 hr tablet Take 500 mg by mouth 2 (two) times daily.  10/09/14   [provider]  simvastatin (ZOCOR) 40 MG tablet  11/07/16   [provider]  zolpidem (AMBIEN) 10 MG tablet Take 10 mg by mouth at bedtime  as needed.  09/14/14   [provider]    Family History Family History  Problem Relation Age of Onset  . Diabetes Mother   . Dementia Mother   . Hypertension Mother   . Alcohol abuse Father   . Cirrhosis Father   . Arthritis Sister        rheumatoid  . Diabetes Brother   . Hypertension Brother   . Diabetes Maternal Grandmother   . Fibromyalgia Sister   . Hypertension Sister   . Fibromyalgia Sister   . Hypertension Sister   . Diabetes Sister   . Hypertension Sister   . Cirrhosis Brother   . Cancer Brother        pancreatic    Social History Social History   Tobacco Use  . Smoking status: Former Smoker    Packs/day: 1.50    Years: 38.00    Pack years: 57.00    Types: Cigarettes    Last attempt to quit: 03/26/2001    Years since quitting: 16.6  . Smokeless tobacco: Never Used  Substance Use Topics  . Alcohol use: Yes    Alcohol/week:  0.0 oz    Comment: occ glass of wine  . Drug use: No     Allergies   Bee pollen and Codeine   Review of Systems Review of Systems  Neurological: Positive for weakness.  All other systems reviewed and are negative.    Physical Exam Updated Vital Signs BP (!) 183/87 (BP Location: Left Leg)   Pulse (!) 101   Temp 98.7 F (37.1 C) (Oral)   Resp 18   Ht 5\' 3"  (1.6 m)   Wt 77.1 kg (170 lb)   SpO2 100%   BMI 30.11 kg/m   Physical Exam  Nursing note and vitals reviewed.  62 year old female, resting comfortably and in no acute distress. Vital signs are significant for hypertension. Oxygen saturation is 100%, which is normal. Head is normocephalic and atraumatic. PERRLA, EOMI. Oropharynx is clear. Neck is nontender and supple without adenopathy or JVD.  There are no carotid bruits. Back is nontender and there is no CVA tenderness. Lungs are clear without rales, wheezes, or rhonchi. Chest is nontender. Heart has regular rate and rhythm without murmur. Abdomen is soft, flat, nontender without masses or hepatosplenomegaly and peristalsis is normoactive. Extremities have no cyanosis or edema, full range of motion is present. Skin is warm and dry without rash. Neurologic: Awake, alert, oriented x3.  Speech is mildly dysarthric but normal content and normal fluidity.  Mild right central facial droop present.  Tongue protrudes to the right of midline.  Moderate weakness of shrug on the right side.  Dense right hemiparesis with strength 0/5 in right arm and right leg.  Strength 5/5 left arm and left leg.  Right Babinski reflex present.  ED Treatments / Results  Labs (all labs ordered are listed, but only abnormal results are displayed) Labs Reviewed  COMPREHENSIVE METABOLIC PANEL - Abnormal; Notable for the following components:      Result Value   CO2 21 (*)    Glucose, Bld 308 (*)    Albumin 3.4 (*)    All other components within normal limits  URINALYSIS, ROUTINE W REFLEX  MICROSCOPIC - Abnormal; Notable for the following components:   Glucose, UA >=500 (*)    Ketones, ur 5 (*)    Protein, ur >=300 (*)    All other components within normal limits  TSH - Abnormal; Notable for the following  components:   TSH 8.579 (*)    All other components within normal limits  GLUCOSE, CAPILLARY - Abnormal; Notable for the following components:   Glucose-Capillary 349 (*)    All other components within normal limits  GLUCOSE, CAPILLARY - Abnormal; Notable for the following components:   Glucose-Capillary 182 (*)    All other components within normal limits  ETHANOL  PROTIME-INR  APTT  CBC  DIFFERENTIAL  RAPID URINE DRUG SCREEN, HOSP PERFORMED  HIV ANTIBODY (ROUTINE TESTING)  HEMOGLOBIN A1C  HEMOGLOBIN A1C  LIPID PANEL  CBC  BASIC METABOLIC PANEL  I-STAT TROPONIN, ED    EKG EKG Interpretation  Date/Time:  Sunday November 25 2017 06:38:17 EDT Ventricular Rate:  86 PR Interval:    QRS Duration: 75 QT Interval:  373 QTC Calculation: 447 R Axis:   37 Text Interpretation:  Sinus rhythm Abnormal R-wave progression, early transition When compared with ECG of 01/01/2010, No significant change was found Confirmed by Dione Booze ([id number]) on 11/25/2017 6:46:33 AM   Radiology Ct Head Wo Contrast  Result Date: 11/25/2017 CLINICAL DATA:  Right-sided weakness for several hours EXAM: CT HEAD WITHOUT CONTRAST TECHNIQUE: Contiguous axial images were obtained from the base of the skull through the vertex without intravenous contrast. COMPARISON:  06/13/2017 FINDINGS: Brain: Scattered areas of decreased attenuation are noted in the deep white matter on the left. Similar changes are noted in the anterior limb of the internal capsule on right. These changes are new from the prior exam and likely related to chronic white matter ischemic change. No definitive acute ischemia is seen. No hemorrhage or space-occupying mass lesion is noted. Vascular: No hyperdense vessel or unexpected  calcification. Skull: Normal. Negative for fracture or focal lesion. Sinuses/Orbits: No acute finding. Other: None. IMPRESSION: Likely subacute to chronic ischemia particularly on the left in the deep white matter. Given the patient's clinical history. MRI is recommended for further evaluation. Electronically Signed   By: Alcide Clever M.D.   On: 11/25/2017 07:33    Procedures Procedures  CRITICAL CARE Performed by: Dione Booze Total critical care time: 60 minutes Critical care time was exclusive of separately billable procedures and treating other patients. Critical care was necessary to treat or prevent imminent or life-threatening deterioration. Critical care was time spent personally by me on the following activities: development of treatment plan with patient and/or surrogate as well as nursing, discussions with consultants, evaluation of patient's response to treatment, examination of patient, obtaining history from patient or surrogate, ordering and performing treatments and interventions, ordering and review of laboratory studies, ordering and review of radiographic studies, pulse oximetry and re-evaluation of patient's condition.  Medications Ordered in ED Medications - No data to display   Initial Impression / Assessment and Plan / ED Course  I have reviewed the triage vital signs and the nursing notes.  Pertinent labs & imaging results that were available during my care of the patient were reviewed by me and considered in my medical decision making (see chart for details).  Apparent stroke with right-sided weakness.  Last known normal is greater than 24 hours ago, which puts her out of the window for thrombolytic intervention and endovascular intervention.  It puts her outside of the window for code stroke activation.  Screening labs will be obtained and she will be sent for CT of head.  ECG shows no acute changes.  Old records are reviewed, and she has no relevant past visits.  7:38  AM I have reviewed the CT of  the head and do not see any evidence of bleeding, early changes of encephalomalacia are noted but no swelling.  Radiologist interpretation pending.  CBC is unremarkable.  Comprehensive metabolic panel is pending.  Case is discussed with Dr. Gwenlyn Perking of [facility] who requests tele-neurology consultation be obtained.  This is requested.  He will admit the patient for stroke evaluation.  7:40 AM CT report has come through.  Radiologist is agreeing that there are changes subacute stroke on the left, consistent with patient's clinical appearance.  Teleneurologist agrees patient should be admitted for stroke work-up, no need fer emergent MRI.  Final Clinical Impressions(s) / ED Diagnoses   Final diagnoses:  Cerebrovascular accident (CVA), unspecified mechanism (HCC)  Uncontrolled hypertension    ED Discharge Orders    None       Dione Booze, MD 11/25/17 2240

## 2017-11-25 NOTE — Plan of Care (Signed)
  Problem: Acute Rehab PT Goals(only PT should resolve) Goal: Pt Will Go Supine/Side To Sit Outcome: Progressing Flowsheets (Taken 11/25/2017 1156) Pt will go Supine/Side to Sit: with minimal assist Goal: Patient Will Transfer Sit To/From Stand Outcome: Progressing Flowsheets (Taken 11/25/2017 1156) Patient will transfer sit to/from stand: with minimal assist Goal: Pt Will Transfer Bed To Chair/Chair To Bed Outcome: Progressing Flowsheets (Taken 11/25/2017 1156) Pt will Transfer Bed to Chair/Chair to Bed: with min assist Goal: Pt Will Ambulate Outcome: Progressing Flowsheets (Taken 11/25/2017 1156) Pt will Ambulate: 15 feet;with moderate assist;with rolling walker;with cane Note:  Quad-cane   11:57 AM, 11/25/17 [name], MPT Physical Therapist [facility] [phone] office [phone] mobile phone

## 2017-11-25 NOTE — Evaluation (Signed)
Physical Therapy Evaluation Patient Details Name: [name] MRN: [id number] DOB: 1956/03/02 Today's Date: 11/25/2017   History of Present Illness  [name] is a 62 y.o. female with past medical history significant for hypertension, hyperlipidemia, type 2 diabetes mellitus, tobacco abuse and fibromyalgia; who presented to the emergency department secondary to right-sided weakness.  Patient was last seen normal Friday night; on Saturday she woke up with right weakness and not feeling good. She thought was secondary to her fibromyalgia and didn't seek medical attention. On Sunday morning her symptoms persist and she came to ED for further evaluation and treatment.     Clinical Impression  Patient unable to functionally use or grip with RUE due to weakness, able to keep trunk in midline while seated at bedside, but had near loss of balance when leaning backwards, had to hold patient's right hand to RW while standing, limited to a few side steps due to RLE weakness, and poor standing balance.  Patient will benefit from continued physical therapy in hospital and recommended venue below to increase strength, balance, endurance for safe ADLs and gait.    Follow Up Recommendations SNF    Equipment Recommendations  Cane(quad cane)    Recommendations for Other Services       Precautions / Restrictions Precautions Precautions: Fall Restrictions Weight Bearing Restrictions: No      Mobility  Bed Mobility Overal bed mobility: Needs Assistance Bed Mobility: Supine to Sit;Sit to Supine     Supine to sit: Mod assist Sit to supine: Mod assist;Max assist   General bed mobility comments: unable to use LUE for sitting, loses sitting balance during sit to supine  Transfers Overall transfer level: Needs assistance Equipment used: Rolling walker (2 wheeled) Transfers: Sit to/from Stand Sit to Stand: Mod assist;Max assist         General transfer comment: unable to grip RW  with right hand due to weakness  Ambulation/Gait Ambulation/Gait assistance: Max assist Gait Distance (Feet): 3 Feet Assistive device: Rolling walker (2 wheeled) Gait Pattern/deviations: Decreased step length - right;Decreased stance time - right;Decreased stride length Gait velocity: slow   General Gait Details: limited to 4-5 side steps at bedside due to right sided weakness, requires tactile cueing to move RLE  Stairs            Wheelchair Mobility    Modified Rankin (Stroke Patients Only)       Balance Overall balance assessment: Needs assistance Sitting-balance support: Feet supported;No upper extremity supported Sitting balance-Leahy Scale: Fair Sitting balance - Comments: near loss of balance when attempting to lean backwards   Standing balance support: During functional activity;Single extremity supported Standing balance-Leahy Scale: Poor Standing balance comment: has to have right hand held to RW, unable to grip                             Pertinent Vitals/Pain Pain Assessment: No/denies pain    Home Living Family/patient expects to be discharged to:: Private residence Living Arrangements: Spouse/significant other Available Help at Discharge: Family Type of Home: House Home Access: Stairs to enter Entrance Stairs-Rails: Right;Left;Can reach both Entrance Stairs-Number of Steps: 4 Home Layout: One level Home Equipment: Cane - single point;Walker - 2 wheels      Prior Function Level of Independence: Independent         Comments: community ambulator, drives     Hand Dominance   Dominant Hand: Right    Extremity/Trunk Assessment  Upper Extremity Assessment Upper Extremity Assessment: Defer to OT evaluation    Lower Extremity Assessment Lower Extremity Assessment: Generalized weakness;RLE deficits/detail;LLE deficits/detail RLE Deficits / Details: grossly 2/5 except ankle dorsiflexion 0/5 LLE Deficits / Details: grossly 4+/5     Cervical / Trunk Assessment Cervical / Trunk Assessment: Normal  Communication   Communication: Expressive difficulties  Cognition Arousal/Alertness: Awake/alert Behavior During Therapy: WFL for tasks assessed/performed Overall Cognitive Status: Within Functional Limits for tasks assessed                                        General Comments      Exercises     Assessment/Plan    PT Assessment Patient needs continued PT services  PT Problem List Decreased strength;Decreased range of motion;Decreased activity tolerance;Decreased balance;Decreased mobility(moderately decrease right ankle dorsiflexion)       PT Treatment Interventions Gait training;Stair training;Functional mobility training;Therapeutic activities;Therapeutic exercise;Patient/family education;Neuromuscular re-education;Balance training    PT Goals (Current goals can be found in the Care Plan section)  Acute Rehab PT Goals Patient Stated Goal: return home after rehab PT Goal Formulation: With patient/family Time For Goal Achievement: 12/12/17 Potential to Achieve Goals: Good    Frequency 7X/week   Barriers to discharge        Co-evaluation               AM-PAC PT "6 Clicks" Daily Activity  Outcome Measure Difficulty turning over in bed (including adjusting bedclothes, sheets and blankets)?: A Lot Difficulty moving from lying on back to sitting on the side of the bed? : A Lot Difficulty sitting down on and standing up from a chair with arms (e.g., wheelchair, bedside commode, etc,.)?: A Lot Help needed moving to and from a bed to chair (including a wheelchair)?: A Lot Help needed walking in hospital room?: Total Help needed climbing 3-5 steps with a railing? : Total 6 Click Score: 10    End of Session   Activity Tolerance: Patient tolerated treatment well;Patient limited by fatigue Patient left: in bed;with call bell/phone within reach;with bed alarm set;with family/visitor  present Nurse Communication: Mobility status PT Visit Diagnosis: Unsteadiness on feet (R26.81);Other abnormalities of gait and mobility (R26.89);Muscle weakness (generalized) (M62.81)    Time: 1110-1141 PT Time Calculation (min) (ACUTE ONLY): 31 min   Charges:   PT Evaluation $PT Eval Moderate Complexity: 1 Mod PT Treatments $Therapeutic Activity: 23-37 mins        11:54 AM, 11/25/17 Ocie BobJames Azyria Osmon, MPT Physical Therapist with [facility] 336 [phone] office [phone] mobile phone

## 2017-11-25 NOTE — H&P (Signed)
History and Physical    [name] KQA:[id number] DOB: 26-Sep-1955 DOA: 11/25/2017  Referring MD/NP/PA: Dr. Roxanne Schaefer PCP: Michele Samples, PA-C  Patient coming from: Home  Chief Complaint: Right-sided weakness  HPI: [name] is a 62 y.o. female with past medical history significant for hypertension, hyperlipidemia, type 2 diabetes mellitus, tobacco abuse and fibromyalgia; who presented to the emergency department secondary to right-sided weakness.  Patient was last seen normal Friday night; on Saturday she woke up with right weakness and not feeling good. She thought was secondary to her fibromyalgia and didn't seek medical attention. On Sunday morning her symptoms persist and she came to ED for further evaluation and treatment.  On evaluation she is having a dense right side hemiparesis mild facial droop and mild dysarthria. She denies chest pain, shortness of breath, nausea, vomiting, dysuria, melena, hematochezia, hematuria, fever, chills, headaches or any other complaints.  In ED, mild hyperglycemia, CT scan without contrast demonstrating subacute to chronic ischemia particularly on the left in the deep white matter. Tele-neurology consulted and recommended admission for non-emergent stroke workup; patient is out of therapeutic window for any intervention.   Past Medical/Surgical History: Past Medical History:  Diagnosis Date  . Diabetes mellitus without complication ([facility])   . Diverticulitis   . Fibromyalgia   . Hyperlipidemia   . Hypertension   . Papule 03/22/2015    Past Surgical History:  Procedure Laterality Date  . CHOLECYSTECTOMY    . KNEE SURGERY Left     Social History:  reports that she quit smoking about 16 years ago. Her smoking use included cigarettes. She has a 57.00 pack-year smoking history. She has never used smokeless tobacco. She reports that she drinks alcohol. She reports that she does not use drugs.  Allergies: Allergies  Allergen  Reactions  . Bee Pollen Anaphylaxis and Swelling  . Codeine     REACTION: nauseated,claustrophobic    Family History:  Family History  Problem Relation Age of Onset  . Diabetes Mother   . Dementia Mother   . Hypertension Mother   . Alcohol abuse Father   . Cirrhosis Father   . Arthritis Sister        rheumatoid  . Diabetes Brother   . Hypertension Brother   . Diabetes Maternal Grandmother   . Fibromyalgia Sister   . Hypertension Sister   . Fibromyalgia Sister   . Hypertension Sister   . Diabetes Sister   . Hypertension Sister   . Cirrhosis Brother   . Cancer Brother        pancreatic    Prior to Admission medications   Medication Sig Start Date End Date Taking? Authorizing Provider  amlodipine-benazepril (LOTREL) 2.5-10 MG capsule  05/02/16  Yes [provider]  escitalopram (LEXAPRO) 10 MG tablet  05/02/16  Yes [provider]  glipiZIDE (GLUCOTROL) 10 MG tablet  10/27/16  Yes [provider]  meloxicam (MOBIC) 7.5 MG tablet  04/12/16  Yes [provider]  simvastatin (ZOCOR) 40 MG tablet  11/07/16  Yes [provider]  Blood Glucose Monitoring Suppl (ONETOUCH VERIO) w/Device KIT USE TO [place] DAILY 11/22/17   [provider]  diclofenac sodium (VOLTAREN) 1 % GEL Apply 3 g to 3 large joints up to 3 times a day when necessary;  dispense 10 tubes with 1 refill 05/12/16   Panwala, Naitik, PA-C  fenofibrate 160 MG tablet Take 160 mg by mouth daily.  11/06/14   [provider]  meloxicam (MOBIC) 15 MG tablet  11/07/16   [provider]  metFORMIN (GLUCOPHAGE) 1000 MG tablet  10/18/16   [provider]  metFORMIN (GLUCOPHAGE-XR) 500 MG 24 hr tablet Take 500 mg by mouth 2 (two) times daily.  10/09/14   [provider]  ONETOUCH VERIO test strip USE 1 STRIP TO [place] DAILY 11/23/17   [provider]  zolpidem (AMBIEN) 10 MG tablet Take 10 mg by mouth at  bedtime as needed.  09/14/14   [provider]    Review of Systems:  Positive for mild dysarthria, right-sided hemiparesis and right facial droop; otherwise negative as mentioned in HPI.    Physical Exam: Vitals:   11/25/17 0700 11/25/17 0744 11/25/17 0747 11/25/17 0810  BP: (!) 220/93 (!) 195/85 (!) 195/85 (!) 223/98  Pulse: 88 86 88 87  Resp: 19 17 18 17   SpO2: 97% 97% 97% 96%  Weight:      Height:       Constitutional: NAD, calm, afebrile, denying chest pain, no shortness of breath. Eyes: PERRL, lids and conjunctivae normal, no icterus, no nystagmus. ENMT: Mucous membranes are moist. Posterior pharynx clear of any exudate or lesions.no thrush. Neck: normal, supple, no masses, no thyromegaly; no JVD. Respiratory: clear to auscultation bilaterally, no wheezing, no crackles. Normal respiratory effort. No using accessory muscles. Cardiovascular: Regular rate and rhythm, no murmurs / rubs / gallops. No extremity edema. 2+ pedal pulses.  Abdomen: no tenderness, no masses palpated. No hepatosplenomegaly. Bowel sounds positive.  Musculoskeletal: no clubbing / cyanosis. No joint deformity upper and lower extremities. Normal muscle tone.  Skin: no rashes, lesions, ulcers.  Neurologic: Mild dysarthria, right-sided hemiparesis.  Normal strength and sensation on her left upper and lower extremities; appreciated mild right facial droop.  Able to follow commands appropriately. Psychiatric: Normal judgment and insight. Alert and oriented x 3. Normal mood.    Labs on Admission: I have personally reviewed the following labs and imaging studies  CBC: Recent Labs  Lab 11/25/17 0651  WBC 6.5  NEUTROABS 3.3  HGB 14.0  HCT 41.3  MCV 91.4  PLT 680   Basic Metabolic Panel: Recent Labs  Lab 11/25/17 0651  NA 137  K 3.7  CL 105  CO2 21*  GLUCOSE 308*  BUN 18  CREATININE 0.87  CALCIUM 9.6   GFR: Estimated Creatinine Clearance: 65.1 mL/min (by C-G formula based on SCr of 0.87  mg/dL).   Liver Function Tests: Recent Labs  Lab 11/25/17 0651  AST 35  ALT 34  ALKPHOS 87  BILITOT 0.6  PROT 7.2  ALBUMIN 3.4*   Coagulation Profile: Recent Labs  Lab 11/25/17 0651  INR 1.03   Urine analysis:    Component Value Date/Time   COLORURINE YELLOW 11/25/2017 [place] 11/25/2017 0747   LABSPEC 1.020 11/25/2017 0747   PHURINE 6.0 11/25/2017 0747   GLUCOSEU >=500 (A) 11/25/2017 0747   HGBUR NEGATIVE 11/25/2017 0747   BILIRUBINUR NEGATIVE 11/25/2017 0747   KETONESUR 5 (A) 11/25/2017 0747   PROTEINUR >=300 (A) 11/25/2017 0747   UROBILINOGEN 0.2 11/08/2014 1915   NITRITE NEGATIVE 11/25/2017 0747   LEUKOCYTESUR NEGATIVE 11/25/2017 0747   Radiological Exams on Admission: Ct Head Wo Contrast  Result Date: 11/25/2017 CLINICAL DATA:  Right-sided weakness for several hours EXAM: CT HEAD WITHOUT CONTRAST TECHNIQUE: Contiguous axial images were obtained from the base of the skull through the vertex without intravenous contrast. COMPARISON:  06/13/2017 FINDINGS: Brain: Scattered areas of  decreased attenuation are noted in the deep white matter on the left. Similar changes are noted in the anterior limb of the internal capsule on right. These changes are new from the prior exam and likely related to chronic white matter ischemic change. No definitive acute ischemia is seen. No hemorrhage or space-occupying mass lesion is noted. Vascular: No hyperdense vessel or unexpected calcification. Skull: Normal. Negative for fracture or focal lesion. Sinuses/Orbits: No acute finding. Other: None. IMPRESSION: Likely subacute to chronic ischemia particularly on the left in the deep white matter. Given the patient's clinical history. MRI is recommended for further evaluation. Electronically Signed   By: Inez Catalina M.D.   On: 11/25/2017 07:33    EKG: Independently reviewed.  LVH by voltage appreciated, no acute ischemic changes, normal sinus rhythm, normal  axis.  Assessment/Plan 1-stroke-like symptoms: In left ischemic stroke.  -Admit to telemetry -Check MRI/MRA -Complete work-up with carotid Dopplers ultrasound and 2D echo -Patient has been placed on aspirin and heparin for DVT prophylaxis. -Will check TSH, A1c and lipid panel -PT, OT and speech therapy has been consulted. -Neurology has also been consulted and will follow further recommendations. -patient out of therapeutic window for intervention.  2-HTN: -Allow permissive hypertension -PRN hydralazine has been ordered  3-HLD -will check lipid panel -resume and adjust statins when taking PO's as needed   4-Type 2 diabetes mellitus with hyperglycemia (HCC) -will check A1C -SSI started  -holding oral hypoglycemic agents for now  5-Fibromyalgia -will resume home pain meds when tolerating PO's  6-Depression -No suicidal ideation no hallucination -Plan is to resume home antidepressant regimen when able to take by mouth.  7-Tobacco abuse  -I have discussed tobacco cessation with the patient.  I have counseled the patient regarding the negative impacts of continued tobacco use including but not limited to lung cancer, COPD, and cardiovascular disease.  I have discussed alternatives to tobacco and modalities that may help facilitate tobacco cessation including but not limited to biofeedback, hypnosis, and medications.  Total time spent with tobacco counseling was 5 minutes -nicotine patch ordered   8-GERD/GI prophylaxis -famotidine   DVT prophylaxis: Heparin Code Status: Full code Family Communication: No family at bedside. Disposition Plan: To be determined; But might need skilled nursing facility rehabilitation at discharge. Consults called: Neurology Admission status: Inpatient, telemetry bed, LOS more than 2 midnights.   Time Spent: 70 minutes  Barton Dubois MD [facility] Pager [phone]  If 7PM-7AM, please contact night-coverage [email or web address] Password  [facility]  11/25/2017, 8:59 AM

## 2017-11-25 NOTE — ED Notes (Signed)
Family at bedside. Pt having teleneuro assessment at this time.  .[name]

## 2017-11-26 ENCOUNTER — Inpatient Hospital Stay (HOSPITAL_COMMUNITY): Payer: Medicare HMO

## 2017-11-26 DIAGNOSIS — I5030 Unspecified diastolic (congestive) heart failure: Secondary | ICD-10-CM

## 2017-11-26 LAB — ECHOCARDIOGRAM COMPLETE
HEIGHTINCHES: 63 in
Weight: 2720 oz

## 2017-11-26 LAB — CBC
HEMATOCRIT: 40.5 % (ref 36.0–46.0)
HEMOGLOBIN: 13.8 g/dL (ref 12.0–15.0)
MCH: 31.2 pg (ref 26.0–34.0)
MCHC: 34.1 g/dL (ref 30.0–36.0)
MCV: 91.4 fL (ref 78.0–100.0)
Platelets: 235 10*3/uL (ref 150–400)
RBC: 4.43 MIL/uL (ref 3.87–5.11)
RDW: 14 % (ref 11.5–15.5)
WBC: 7.4 10*3/uL (ref 4.0–10.5)

## 2017-11-26 LAB — GLUCOSE, CAPILLARY
Glucose-Capillary: 199 mg/dL — ABNORMAL HIGH (ref 70–99)
Glucose-Capillary: 222 mg/dL — ABNORMAL HIGH (ref 70–99)
Glucose-Capillary: 223 mg/dL — ABNORMAL HIGH (ref 70–99)
Glucose-Capillary: 239 mg/dL — ABNORMAL HIGH (ref 70–99)
Glucose-Capillary: 251 mg/dL — ABNORMAL HIGH (ref 70–99)

## 2017-11-26 LAB — BASIC METABOLIC PANEL
Anion gap: 6 (ref 5–15)
BUN: 19 mg/dL (ref 8–23)
CHLORIDE: 110 mmol/L (ref 98–111)
CO2: 24 mmol/L (ref 22–32)
CREATININE: 0.89 mg/dL (ref 0.44–1.00)
Calcium: 9.2 mg/dL (ref 8.9–10.3)
GFR calc Af Amer: >60 mL/min (ref >60)
GFR calc non Af Amer: >60 mL/min (ref >60)
GLUCOSE: 240 mg/dL — AB (ref 70–99)
POTASSIUM: 3.7 mmol/L (ref 3.5–5.1)
Sodium: 140 mmol/L (ref 135–145)

## 2017-11-26 LAB — LIPID PANEL
CHOLESTEROL: 218 mg/dL — AB (ref 0–200)
HDL: 25 mg/dL — ABNORMAL LOW (ref >40)
LDL Cholesterol: 121 mg/dL — ABNORMAL HIGH (ref 0–99)
TRIGLYCERIDES: 360 mg/dL — AB (ref <150)
Total CHOL/HDL Ratio: 8.7 RATIO
VLDL: 72 mg/dL — ABNORMAL HIGH (ref 0–40)

## 2017-11-26 LAB — HEMOGLOBIN A1C
HEMOGLOBIN A1C: 9.5 % — AB (ref 4.8–5.6)
MEAN PLASMA GLUCOSE: 225.95 mg/dL

## 2017-11-26 LAB — HIV ANTIBODY (ROUTINE TESTING W REFLEX): HIV Screen 4th Generation wRfx: NONREACTIVE

## 2017-11-26 MED ORDER — OMEGA-3-ACID ETHYL ESTERS 1 G PO CAPS
1.0000 g | ORAL_CAPSULE | Freq: Two times a day (BID) | ORAL | Status: DC
  Administered 2017-11-27: 1 g via ORAL

## 2017-11-26 MED ORDER — INSULIN DETEMIR 100 UNIT/ML ~~LOC~~ SOLN
10.0000 [IU] | Freq: Every day | SUBCUTANEOUS | Status: DC
  Administered 2017-11-26: 10 [IU] via SUBCUTANEOUS
  Filled 2017-11-26 (×2): qty 0.1

## 2017-11-26 MED ORDER — LIVING WELL WITH DIABETES BOOK
Freq: Once | Status: AC
  Administered 2017-11-26: 12:00:00
  Filled 2017-11-26: qty 1

## 2017-11-26 MED ORDER — ATORVASTATIN CALCIUM 40 MG PO TABS
40.0000 mg | ORAL_TABLET | Freq: Every day | ORAL | Status: DC
  Administered 2017-11-26: 40 mg via ORAL
  Filled 2017-11-26: qty 1

## 2017-11-26 MED ORDER — PANTOPRAZOLE SODIUM 40 MG PO TBEC
40.0000 mg | DELAYED_RELEASE_TABLET | Freq: Every day | ORAL | Status: DC
  Administered 2017-11-26 – 2017-11-27 (×2): 40 mg via ORAL
  Filled 2017-11-26 (×2): qty 1

## 2017-11-26 MED ORDER — LEVOTHYROXINE SODIUM 50 MCG PO TABS
50.0000 ug | ORAL_TABLET | Freq: Every day | ORAL | Status: DC
  Administered 2017-11-27: 50 ug via ORAL
  Filled 2017-11-26: qty 1

## 2017-11-26 MED ORDER — ESCITALOPRAM OXALATE 10 MG PO TABS
10.0000 mg | ORAL_TABLET | Freq: Every day | ORAL | Status: DC
  Administered 2017-11-26 – 2017-11-27 (×2): 10 mg via ORAL
  Filled 2017-11-26 (×2): qty 1

## 2017-11-26 NOTE — Evaluation (Signed)
Speech Language Pathology Evaluation Patient Details Name: [name] MRN: [id number] DOB: 07/27/1955 Today's Date: 11/26/2017 Time: [id number] SLP Time Calculation (min) (ACUTE ONLY): 21 min  Problem List:  Patient Active Problem List   Diagnosis Date Noted  . Stroke-like symptoms 11/25/2017  . Type 2 diabetes mellitus with hyperglycemia (HCC) 11/25/2017  . HLD (hyperlipidemia) 11/25/2017  . Fibromyalgia 11/25/2017  . Depression 11/25/2017  . Tobacco abuse 11/25/2017  . Papule 03/22/2015  . Hypertension   . Acute diverticulitis 11/08/2014  . Diabetes (HCC) 11/08/2014  . Renal insufficiency 11/08/2014  . Abdominal pain 11/08/2014  . Left leg weakness 04/10/2011  . Abnormal gait 04/10/2011  . S/P total knee replacement 12/28/2010  . Knee pain 12/28/2010  . Knee stiffness 12/28/2010   Past Medical History:  Past Medical History:  Diagnosis Date  . Diabetes mellitus without complication (HCC)   . Diverticulitis   . Fibromyalgia   . Hyperlipidemia   . Hypertension   . Papule 03/22/2015   Past Surgical History:  Past Surgical History:  Procedure Laterality Date  . CHOLECYSTECTOMY    . KNEE SURGERY Left    HPI:  [name] a 62 y.o.femalewith past medical history significant for hypertension, hyperlipidemia,type 2 diabetes mellitus, tobacco abuse and fibromyalgia; who presented to the emergency department secondary to right-sided weakness. Patient was last seen normal Friday night; on Saturday she woke up with right weakness and not feeling good. She thought was secondary to her fibromyalgia and didn't seek medical attention. On Sunday morning her symptoms persist and she came to ED for further evaluation and treatment.She is having a dense right side hemiparesis mild facial droop and mild dysarthria. She denies chest pain, shortness of breath, nausea, vomiting, dysuria, melena, hematochezia, hematuria, fever, chills, headaches or any other complaints. In  ED,mild hyperglycemia, CT scan without contrast demonstrating subacute to chronic ischemia particularly on the left in the deep white matter.Tele-neurology consulted and recommended admission for non-emergent stroke workup; patient is out of therapeutic window for any intervention.MRI shows Acute and subacute left lateral lenticulostriate territory infarcts. BSE and SLE requested.   Assessment / Plan / Recommendation Clinical Impression  Pt presents with mild/mod expressive language and motor speech deficits negatively impacting her ability to communicate complex wants and needs. Recommend continued SLP services at next level of care (SNF) to address mild aphasia, mild/mod dysarthria, and dysphagia.     SLP Assessment  SLP Recommendation/Assessment: Patient needs continued Speech Lanaguage Pathology Services SLP Visit Diagnosis: Dysarthria and anarthria (R47.1);Aphasia (R47.01)    Follow Up Recommendations  Skilled Nursing facility    Frequency and Duration min 2x/week  1 week      SLP Evaluation Cognition  Overall Cognitive Status: Within Functional Limits for tasks assessed Arousal/Alertness: Awake/alert Orientation Level: Oriented X4 Memory: Appears intact Awareness: Appears intact Problem Solving: Appears intact Executive Function: Self Correcting Self Correcting: Impaired Self Correcting Impairment: Verbal basic Safety/Judgment: Appears intact       Comprehension  Auditory Comprehension Overall Auditory Comprehension: Appears within functional limits for tasks assessed Yes/No Questions: Within Functional Limits Commands: Within Functional Limits Conversation: Simple Visual Recognition/Discrimination Discrimination: Not tested Reading Comprehension Reading Status: Not tested    Expression Expression Primary Mode of Expression: Verbal Verbal Expression Overall Verbal Expression: Impaired Initiation: No impairment Automatic Speech: Name;Social Response Level of  Generative/Spontaneous Verbalization: Conversation Repetition: Impaired Level of Impairment: Phrase level Naming: Impairment Responsive: 76-100% accurate Confrontation: Impaired Convergent: 75-100% accurate Divergent: 75-100% accurate Verbal Errors: Phonemic paraphasias Pragmatics: No impairment Interfering  Components: Speech intelligibility Effective Techniques: Phonemic cues;Sentence completion Non-Verbal Means of Communication: Not applicable Written Expression Dominant Hand: Right Written Expression: Not tested   Oral / Motor  Oral Motor/Sensory Function Overall Oral Motor/Sensory Function: Mild impairment Facial ROM: Reduced right Facial Symmetry: Abnormal symmetry right Facial Strength: Reduced right Facial Sensation: Reduced right Lingual ROM: Within Functional Limits Lingual Symmetry: Within Functional Limits Lingual Strength: Within Functional Limits Lingual Sensation: Reduced Velum: Within Functional Limits Mandible: Within Functional Limits Motor Speech Overall Motor Speech: Impaired Respiration: Within functional limits Phonation: Normal Resonance: Within functional limits Articulation: Impaired Level of Impairment: Word Intelligibility: Intelligibility reduced Word: 75-100% accurate Phrase: 75-100% accurate Sentence: 75-100% accurate Conversation: 75-100% accurate Motor Planning: Witnin functional limits Motor Speech Errors: Not applicable Effective Techniques: Slow rate;Over-articulate   Thank you,  Havery Moros, CCC-SLP [phone]                     [name] 11/26/2017, 12:48 PM

## 2017-11-26 NOTE — Clinical Social Work Note (Signed)
Clinical Social Work Assessment  Patient Details  Name: [name] MRN: [id number] Date of Birth: 1955-08-31  Date of referral:  11/26/17               Reason for consult:  Facility Placement, Discharge Planning                Permission sought to share information with:  Facility Art therapist granted to share information::     Name::        Agency::  [facility]  Relationship::     Contact Information:     Housing/Transportation Living arrangements for the past 2 months:  Single Family Home Source of Information:  Patient Patient Interpreter Needed:  None Criminal Activity/Legal Involvement Pertinent to Current Situation/Hospitalization:  No - Comment as needed Significant Relationships:  Spouse, Adult Children Lives with:  Spouse Do you feel safe going back to the place where you live?  Yes Need for family participation in patient care:  No (Coment)  Care giving concerns: PT recommending SNF rehab   Social Worker assessment / plan: Pt is a 62 year old female referred to CSW for SNF rehab placement. Met with pt and her family this AM to assist. Per pt, she would like to go to [facility] the rehab. Will start referral. If accepted, they will have to obtain authorization from pt's insurance. Will follow.  Employment status:  Disabled (Comment on whether or not currently receiving Disability) Insurance information:  Managed Medicare PT Recommendations:  [place] / Referral to community resources:  [place]  Patient/Family's Response to care: Pt accepting of care.  Patient/Family's Understanding of and Emotional Response to Diagnosis, Current Treatment, and Prognosis: Pt appears to have a good understanding of diagnosis and treatment recommendations. No emotional distress identified.  Emotional Assessment Appearance:  Appears stated age Attitude/Demeanor/Rapport:  Engaged Affect (typically observed):   Pleasant Orientation:  Oriented to Self, Oriented to Place, Oriented to  Time, Oriented to Situation Alcohol / Substance use:  Not Applicable Psych involvement (Current and /or in the community):  No (Comment)  Discharge Needs  Concerns to be addressed:  Discharge Planning Concerns Readmission within the last 30 days:  No Current discharge risk:  Physical Impairment Barriers to Discharge:  Luis Lopez, LCSW 11/26/2017, 1:01 PM

## 2017-11-26 NOTE — Consult Note (Signed)
Michele A. Merlene Laughter, MD     [email or web address]          [place] is an 62 y.o. female.   ASSESSMENT/PLAN: 1.  Acute posterior basal ganglia infarct.  The distribution seems suspicious for Lenticulostriate branches of the L MCA.  Aspirin 325 is recommended.  Risk factors significant poorly controlled diabetes, dyslipidemia, hypertension and age.  Agree with aspirin 325 mg.  Maximize/optimize statin medication.  Given the patient's recurrent stroke on imaging, 30-day event monitor is recommended. 2.  Subacute basal ganglia infarct on the left again suspicious for ventricle  Lenticulostriatal branches 3.  Remote infarct involving the right pontine area, right internal capsule and right basal ganglia.    The patient is a 62 year old white female who presents with 2 to 3-day history of weakness on the right side.  The weakness was associated with the patient falling.  No loss of consciousness is reported.  Symptoms are associated with dysarthria.  The husband reports that about 2 weeks ago she did have some weakness which was of some concern.  The family seem to think that adjustments in her diabetes medication is a culprit although this seems unlikely.  Patient was not considered for thrombolysis due to the delayed presentation.  The review of systems otherwise negative.    GENERAL: This is a very pleasant female who is in no acute distress.  HEENT: This is normal.  ABDOMEN: soft  EXTREMITIES: No edema   BACK: This is normal.  SKIN: Normal by inspection.    MENTAL STATUS: Alert and oriented including orientation to age and month- . Speech moderately dysarthric, language and cognition are generally intact. Judgment and insight normal.   CRANIAL NERVES: Pupils are equal, round and reactive to light and accomodation; extra ocular movements are full, there is no significant nystagmus; visual fields are full; upper and lower facial muscles are normal in  strength and symmetric, there is no flattening of the nasolabial folds; tongue is midline; uvula is midline; shoulder elevation is normal.  MOTOR: Right upper extremity 0/5.  Right lower extremity 2/5.  Both shows profound drift.  Left upper extremity and left leg 5/5 with no drift in either.  COORDINATION: Left finger to nose is normal, right finger to nose is normal, No rest tremor; no intention tremor; no postural tremor; no bradykinesia.  REFLEXES: Deep tendon reflexes are symmetrical and normal. Plantar reflexes are flexor on the left and extensor on the right.  SENSATION: Normal to light touch, temperature, and pain.  She does not extinguish to double simultaneous stimulation.   NIH stroke scale 1, 2, 2 total 5.    Blood pressure (!) 202/84, pulse 92, temperature 98.6 F (37 C), temperature source Oral, resp. rate 18, height 5' 3"  (1.6 m), weight 170 lb (77.1 kg), SpO2 97 %.  Past Medical History:  Diagnosis Date  . Diabetes mellitus without complication ([place])   . Diverticulitis   . Fibromyalgia   . Hyperlipidemia   . Hypertension   . Papule 03/22/2015    Past Surgical History:  Procedure Laterality Date  . CHOLECYSTECTOMY    . KNEE SURGERY Left     Family History  Problem Relation Age of Onset  . Diabetes Mother   . Dementia Mother   . Hypertension Mother   . Alcohol abuse Father   . Cirrhosis Father   . Arthritis Sister        rheumatoid  . Diabetes Brother   . Hypertension Brother   .  Diabetes Maternal Grandmother   . Fibromyalgia Sister   . Hypertension Sister   . Fibromyalgia Sister   . Hypertension Sister   . Diabetes Sister   . Hypertension Sister   . Cirrhosis Brother   . Cancer Brother        pancreatic    Social History:  reports that she quit smoking about 16 years ago. Her smoking use included cigarettes. She has a 57.00 pack-year smoking history. She has never used smokeless tobacco. She reports that she drinks alcohol. She reports that she  does not use drugs.  Allergies:  Allergies  Allergen Reactions  . Bee Pollen Anaphylaxis and Swelling  . Codeine     REACTION: nauseated,claustrophobic    Medications: Prior to Admission medications   Medication Sig Start Date End Date Taking? Authorizing Provider  amlodipine-benazepril (LOTREL) 2.5-10 MG capsule Take 1 capsule by mouth daily.  05/02/16  Yes [provider]  diclofenac sodium (VOLTAREN) 1 % GEL Apply 3 g to 3 large joints up to 3 times a day when necessary;  dispense 10 tubes with 1 refill 05/12/16  Yes Panwala, Naitik, PA-C  escitalopram (LEXAPRO) 10 MG tablet Take 10 mg by mouth daily.  05/02/16  Yes [provider]  fenofibrate 160 MG tablet Take 160 mg by mouth daily.  11/06/14  Yes [provider]  glipiZIDE (GLUCOTROL XL) 10 MG 24 hr tablet Take 1 tablet by mouth daily. 10/25/17  Yes [provider]  metFORMIN (GLUCOPHAGE) 500 MG tablet Take 1 tablet by mouth 2 (two) times daily. 10/25/17  Yes [provider]  nabumetone (RELAFEN) 500 MG tablet Take 1 tablet by mouth 2 (two) times daily. 10/27/17  Yes [provider]  simvastatin (ZOCOR) 40 MG tablet Take 40 mg by mouth daily at 6 PM.  11/07/16  Yes [provider]  Helen 100-33 UNT-MCG/ML SOPN Inject 25 Units into the skin daily before breakfast. 10/30/17  Yes [provider]  zolpidem (AMBIEN) 10 MG tablet Take 10 mg by mouth at bedtime as needed.  09/14/14  Yes [provider]  Blood Glucose Monitoring Suppl (ONETOUCH VERIO) w/Device KIT USE TO CHECK GLUCOSE THREE TIMES DAILY 11/22/17   [provider]  ONETOUCH VERIO test strip USE 1 STRIP TO [place] DAILY 11/23/17   [provider]    Scheduled Meds: . aspirin  325 mg Oral Daily  . atorvastatin  40 mg Oral q1800  . escitalopram  10 mg Oral Daily  . heparin  5,000 Units Subcutaneous Q8H  . insulin aspart  0-9 Units Subcutaneous Q6H  . insulin detemir  10  Units Subcutaneous QHS  . [START ON 11/27/2017] levothyroxine  50 mcg Oral QAC breakfast  . nicotine  21 mg Transdermal Daily  . omega-3 acid ethyl esters  1 g Oral BID  . pantoprazole  40 mg Oral Daily   Continuous Infusions: . sodium chloride 10 mL/hr at 11/26/17 1730   PRN Meds:.acetaminophen **OR** acetaminophen (TYLENOL) oral liquid 160 mg/5 mL **OR** acetaminophen, hydrALAZINE, ondansetron (ZOFRAN) IV     Results for orders placed or performed during the hospital encounter of 11/25/17 (from the past 48 hour(s))  Urine rapid drug screen (hosp performed)     Status: None   Collection Time: 11/25/17  6:39 AM  Result Value Ref Range   Opiates NONE DETECTED NONE DETECTED   Cocaine NONE DETECTED NONE DETECTED   Benzodiazepines NONE DETECTED NONE DETECTED   Amphetamines NONE DETECTED NONE DETECTED  Tetrahydrocannabinol NONE DETECTED NONE DETECTED   Barbiturates NONE DETECTED NONE DETECTED    Comment: (NOTE) DRUG SCREEN FOR MEDICAL PURPOSES ONLY.  IF CONFIRMATION IS NEEDED FOR ANY PURPOSE, NOTIFY LAB WITHIN 5 DAYS. LOWEST DETECTABLE LIMITS FOR URINE DRUG SCREEN Drug Class                     Cutoff (ng/mL) Amphetamine and metabolites    1000 Barbiturate and metabolites    200 Benzodiazepine                 239 Tricyclics and metabolites     300 Opiates and metabolites        300 Cocaine and metabolites        300 THC                            50 Performed at [facility], [address] [place], [place] [address]   Ethanol     Status: None   Collection Time: 11/25/17  6:51 AM  Result Value Ref Range   Alcohol, Ethyl (B) <10 <10 mg/dL    Comment: (NOTE) Lowest detectable limit for serum alcohol is 10 mg/dL. For medical purposes only. Performed at [facility], [address], [place], [place] [address]   Protime-INR     Status: None   Collection Time: 11/25/17  6:51 AM  Result Value Ref Range   Prothrombin Time 13.4 11.4 - 15.2 seconds   INR 1.03     Comment:  Performed at [facility], [address], [place], [place] [address]  APTT     Status: None   Collection Time: 11/25/17  6:51 AM  Result Value Ref Range   aPTT 27 24 - 36 seconds    Comment: Performed at [facility], [address] [place], [place] [address]  CBC     Status: None   Collection Time: 11/25/17  6:51 AM  Result Value Ref Range   WBC 6.5 4.0 - 10.5 K/uL   RBC 4.52 3.87 - 5.11 MIL/uL   Hemoglobin 14.0 12.0 - 15.0 g/dL   HCT 41.3 36.0 - 46.0 %   MCV 91.4 78.0 - 100.0 fL   MCH 31.0 26.0 - 34.0 pg   MCHC 33.9 30.0 - 36.0 g/dL   RDW 13.9 11.5 - 15.5 %   Platelets 202 150 - 400 K/uL    Comment: Performed at [facility], [address], [place], [place] [address]  Differential     Status: None   Collection Time: 11/25/17  6:51 AM  Result Value Ref Range   Neutrophils Relative % 50 %   Neutro Abs 3.3 1.7 - 7.7 K/uL   Lymphocytes Relative 40 %   Lymphs Abs 2.6 0.7 - 4.0 K/uL   Monocytes Relative 9 %   Monocytes Absolute 0.6 0.1 - 1.0 K/uL   Eosinophils Relative 1 %   Eosinophils Absolute 0.1 0.0 - 0.7 K/uL   Basophils Relative 0 %   Basophils Absolute 0.0 0.0 - 0.1 K/uL    Comment: Performed at [facility], [address] [place], [place] [address]  Comprehensive metabolic panel     Status: Abnormal   Collection Time: 11/25/17  6:51 AM  Result Value Ref Range   Sodium 137 135 - 145 mmol/L   Potassium 3.7 3.5 - 5.1 mmol/L   Chloride 105 98 - 111 mmol/L   CO2 21 (L) 22 - 32 mmol/L  Glucose, Bld 308 (H) 70 - 99 mg/dL   BUN 18 8 - 23 mg/dL   Creatinine, Ser 0.87 0.44 - 1.00 mg/dL   Calcium 9.6 8.9 - 10.3 mg/dL   Total Protein 7.2 6.5 - 8.1 g/dL   Albumin 3.4 (L) 3.5 - 5.0 g/dL   AST 35 15 - 41 U/L   ALT 34 0 - 44 U/L   Alkaline Phosphatase 87 38 - 126 U/L   Total Bilirubin 0.6 0.3 - 1.2 mg/dL   GFR calc non Af Amer >60 >60 mL/min   GFR calc Af Amer >60 >60 mL/min    Comment: (NOTE) The eGFR has been calculated using the CKD EPI equation. This calculation has  not been validated in all clinical situations. eGFR's persistently <60 mL/min signify possible Chronic Kidney Disease.    Anion gap 11 5 - 15    Comment: Performed at [facility], [address] [place], [place] [address]  HIV antibody (Routine Testing)     Status: None   Collection Time: 11/25/17  6:51 AM  Result Value Ref Range   HIV Screen 4th Generation wRfx Non Reactive Non Reactive    Comment: (NOTE) Performed At: [facility] [place], [place] [id number] Rush Farmer MD [id number]   Hemoglobin A1c     Status: Abnormal   Collection Time: 11/25/17  6:51 AM  Result Value Ref Range   Hgb A1c MFr Bld 9.6 (H) 4.8 - 5.6 %    Comment: (NOTE) Pre diabetes:          5.7%-6.4% Diabetes:              >6.4% Glycemic control for   <7.0% adults with diabetes    Mean Plasma Glucose 228.82 mg/dL    Comment: Performed at [facility], [place] [address] [place], [place] [address]  TSH     Status: Abnormal   Collection Time: 11/25/17  6:51 AM  Result Value Ref Range   TSH 8.579 (H) 0.350 - 4.500 uIU/mL    Comment: Performed by a 3rd Generation assay with a functional sensitivity of <=0.01 uIU/mL. Performed at [facility], [address] [place], [place] [address]   I-stat troponin, ED     Status: None   Collection Time: 11/25/17  7:06 AM  Result Value Ref Range   Troponin i, poc 0.00 0.00 - 0.08 ng/mL   Comment 3            Comment: Due to the release kinetics of cTnI, a negative result within the first hours of the onset of symptoms does not rule out myocardial infarction with certainty. If myocardial infarction is still suspected, repeat the test at appropriate intervals.   Urinalysis, Routine w reflex microscopic     Status: Abnormal   Collection Time: 11/25/17  7:47 AM  Result Value Ref Range   Color, Urine YELLOW YELLOW   APPearance CLEAR CLEAR   Specific Gravity, Urine 1.020 1.005 - 1.030   pH 6.0 5.0 - 8.0   Glucose, UA >=500 (A)  NEGATIVE mg/dL   Hgb urine dipstick NEGATIVE NEGATIVE   Bilirubin Urine NEGATIVE NEGATIVE   Ketones, ur 5 (A) NEGATIVE mg/dL   Protein, ur >=300 (A) NEGATIVE mg/dL   Nitrite NEGATIVE NEGATIVE   Leukocytes, UA NEGATIVE NEGATIVE   RBC / HPF 0-5 0 - 5 RBC/hpf   WBC, UA 0-5 0 - 5 WBC/hpf   Bacteria, UA NONE SEEN NONE SEEN    Comment: Performed  at [facility], [address], [place], [place] [address]  Glucose, capillary     Status: Abnormal   Collection Time: 11/25/17 11:33 AM  Result Value Ref Range   Glucose-Capillary 349 (H) 70 - 99 mg/dL   Comment 1 Notify RN    Comment 2 Document in Chart   Glucose, capillary     Status: Abnormal   Collection Time: 11/25/17  6:38 PM  Result Value Ref Range   Glucose-Capillary 182 (H) 70 - 99 mg/dL   Comment 1 Notify RN    Comment 2 Document in Chart   Glucose, capillary     Status: Abnormal   Collection Time: 11/25/17 10:22 PM  Result Value Ref Range   Glucose-Capillary 157 (H) 70 - 99 mg/dL  Glucose, capillary     Status: Abnormal   Collection Time: 11/25/17 11:56 PM  Result Value Ref Range   Glucose-Capillary 165 (H) 70 - 99 mg/dL  Glucose, capillary     Status: Abnormal   Collection Time: 11/26/17  5:47 AM  Result Value Ref Range   Glucose-Capillary 222 (H) 70 - 99 mg/dL  Hemoglobin A1c     Status: Abnormal   Collection Time: 11/26/17  6:31 AM  Result Value Ref Range   Hgb A1c MFr Bld 9.5 (H) 4.8 - 5.6 %    Comment: (NOTE) Pre diabetes:          5.7%-6.4% Diabetes:              >6.4% Glycemic control for   <7.0% adults with diabetes    Mean Plasma Glucose 225.95 mg/dL    Comment: Performed at [facility], [place] [address] [place], [place] [address]  Lipid panel     Status: Abnormal   Collection Time: 11/26/17  6:31 AM  Result Value Ref Range   Cholesterol 218 (H) 0 - 200 mg/dL   Triglycerides 360 (H) <150 mg/dL   HDL 25 (L) >40 mg/dL   Total CHOL/HDL Ratio 8.7 RATIO   VLDL 72 (H) 0 - 40 mg/dL   LDL Cholesterol 121 (H)  0 - 99 mg/dL    Comment:        Total Cholesterol/HDL:CHD Risk Coronary Heart Disease Risk Table                     Men   Women  1/2 Average Risk   3.4   3.3  Average Risk       5.0   4.4  2 X Average Risk   9.6   7.1  3 X Average Risk  23.4   11.0        Use the calculated Patient Ratio above and the CHD Risk Table to determine the patient's CHD Risk.        ATP III CLASSIFICATION (LDL):  <100     mg/dL   Optimal  100-129  mg/dL   Near or Above                    Optimal  130-159  mg/dL   Borderline  160-189  mg/dL   High  >190     mg/dL   Very High Performed at [facility] [place], [place] [address]   CBC     Status: None   Collection Time: 11/26/17  6:31 AM  Result Value Ref Range   WBC 7.4 4.0 - 10.5 K/uL   RBC 4.43 3.87 - 5.11 MIL/uL  Hemoglobin 13.8 12.0 - 15.0 g/dL   HCT 40.5 36.0 - 46.0 %   MCV 91.4 78.0 - 100.0 fL   MCH 31.2 26.0 - 34.0 pg   MCHC 34.1 30.0 - 36.0 g/dL   RDW 14.0 11.5 - 15.5 %   Platelets 235 150 - 400 K/uL    Comment: Performed at [facility], [address], [place], [place] [address]  Basic metabolic panel     Status: Abnormal   Collection Time: 11/26/17  6:31 AM  Result Value Ref Range   Sodium 140 135 - 145 mmol/L   Potassium 3.7 3.5 - 5.1 mmol/L   Chloride 110 98 - 111 mmol/L   CO2 24 22 - 32 mmol/L   Glucose, Bld 240 (H) 70 - 99 mg/dL   BUN 19 8 - 23 mg/dL   Creatinine, Ser 0.89 0.44 - 1.00 mg/dL   Calcium 9.2 8.9 - 10.3 mg/dL   GFR calc non Af Amer >60 >60 mL/min   GFR calc Af Amer >60 >60 mL/min    Comment: (NOTE) The eGFR has been calculated using the CKD EPI equation. This calculation has not been validated in all clinical situations. eGFR's persistently <60 mL/min signify possible Chronic Kidney Disease.    Anion gap 6 5 - 15    Comment: Performed at [facility], [address] [place], [place] [address]  Glucose, capillary     Status: Abnormal   Collection Time: 11/26/17 11:46 AM  Result Value Ref  Range   Glucose-Capillary 251 (H) 70 - 99 mg/dL  Glucose, capillary     Status: Abnormal   Collection Time: 11/26/17  4:10 PM  Result Value Ref Range   Glucose-Capillary 223 (H) 70 - 99 mg/dL    Studies/Results:    BRAIN MRI FINDINGS: Brain: There is an acute infarct involving the posterior left lentiform nucleus and corona radiata. An adjacent infarct immediately anterior to this has a subacute appearance. No intracranial hemorrhage, mass, midline shift, or extra-axial fluid collection is identified. The ventricles and sulci are within normal limits for age. Small foci of T2 hyperintensity scattered throughout the subcortical and deep cerebral white matter and pons are nonspecific but compatible with mild chronic small vessel ischemic disease. Chronic lacunar infarcts are noted in the right basal ganglia, right internal capsule, and thalami.  Vascular: Major intracranial vascular flow voids are preserved.  Skull and upper cervical spine: No suspicious marrow lesion.  Sinuses/Orbits: Unremarkable orbits. Paranasal sinuses and mastoid air cells are clear.  Other: None.  IMPRESSION: 1. Acute and subacute left lateral lenticulostriate territory infarcts. 2. Chronic small vessel ischemia with multiple chronic deep gray nuclei and white matter infarcts.   BRAIN MRA FINDINGS: The intracranial vertebral arteries are patent to the basilar and codominant. The left PICA and right AICA appear dominant. Patent SCA origins are visualized bilaterally. The basilar artery is widely patent. There is a large right posterior communicating artery. The PCAs are patent with multifocal severe P2 stenoses bilaterally.  The internal carotid arteries are patent from skull base to carotid termini with mild left ICA stenosis near the petrous-cavernous junction. ACAs and MCAs are patent without evidence of significant A1 or M1 stenosis or proximal branch occlusion. There is a severe mid  left M2 branch vessel stenosis, and there is a moderate distal left A2 stenosis. No aneurysm is identified.  IMPRESSION: 1. No large vessel occlusion. 2. Intracranial atherosclerosis as above including severe bilateral P2 stenoses   CAROTID DOPPLERS  IMPRESSION: Color duplex  indicates minimal heterogeneous and calcified plaque, with no hemodynamically significant stenosis by duplex criteria in the extracranial cerebrovascular circulation.    ECHO - Left ventricle: The cavity size was normal. Wall thickness was   increased in a pattern of moderate LVH. Systolic function was   vigorous. The estimated ejection fraction was in the range of 65%   to 70%. Wall motion was normal; there were no regional wall   motion abnormalities. Doppler parameters are consistent with   abnormal left ventricular relaxation (grade 1 diastolic   dysfunction). - Aortic valve: Mildly calcified annulus. Trileaflet; mildly   thickened leaflets. - Mitral valve: Mildly calcified annulus. Normal thickness leaflets   . - Systemic veins: IVC is small, suggesting low RA pressure and   hypovolemia. - Technically adequate study.   The brain MRI scan and MRA are reviewed in person.  There is a bright signal seen on the DWI scan involving the posterior one third of the lentiform nucleus.  There is a less bright area involving the middle one third of the lentiform nucleus on the same side.  This however is also somewhat bright on ADC scan suggestive of more chronic infarct.  The posterior acute appearing infarct area is associated with reduced signal on the ADC scan.  No hemorrhages appreciated.  There is moderate periventricular deep white matter signal seen on FLAIR.  T1 shows encephalomalacia involving the right putamen, right internal capsule and the pontine region on the right side.  MRA shows no intracranial occlusive disease of significance.  There is some luminal irregularities however involving the PCA  arteries bilaterally.    Chiante Peden A. Merlene Schaefer, M.D.  Diplomate, Tax adviser of Psychiatry and Neurology ( Neurology). 11/26/2017, 7:58 PM

## 2017-11-26 NOTE — Progress Notes (Signed)
PROGRESS NOTE    [name]  VHQ:[id number] DOB: February 10, 1956 DOA: 11/25/2017 PCP: Avis Epley, PA-C    Brief Narrative:  62 y.o. female with past medical history significant for hypertension, hyperlipidemia, type 2 diabetes mellitus, tobacco abuse and fibromyalgia; who presented to the emergency department secondary to right-sided weakness.  Patient was last seen normal Friday night; on Saturday she woke up with right weakness and not feeling good. She thought was secondary to her fibromyalgia and didn't seek medical attention. On Sunday morning her symptoms persist and she came to ED for further evaluation and treatment.  On evaluation she is having a dense right side hemiparesis mild facial droop and mild dysarthria. She denies chest pain, shortness of breath, nausea, vomiting, dysuria, melena, hematochezia, hematuria, fever, chills, headaches or any other complaints.  Assessment & Plan: 1-left brain ischemic stroke  -stroke has been confirmed MRI -Carotid Dopplers demonstrating chills mild plaque buildup -2D echo without source for emboli.;  Grade 1 diastolic dysfunction and preserved ejection fraction. -Continue aspirin for secondary prevention (full dose) -LDL was 121 and the patient will be started on Lipitor 40 mg by mouth daily -A1C 9.5; will adjust hypoglycemic further for better control. Goal is A1C < 7 -follow neurology rec's  2-hypothyroidism -TSH 8.579 -will start synthroid 50 mcg daily -check free T4 -repeat thyroid panel in 6 weeks  3-GERD -continue PPI  4-right side hemiparesis and mild dysphagia -patient will need SNF for further care and rehabilitation  5-type 2 diabetes, uncontrolled, with hyperglycemia and CVA -will continue SSI and will start levemir QHS -follow CBG's and adjust regimen as needed  6-HTN -will allow permissive hypertension -follow VS and adjust regimen as needed   7-tobacco abuse -continue nicotine patch -extensive  cessation counsleing provided on 7/28  8-depression  -resume lexapro  9-HLD -start lipitor 40mg  daily and lovaza 1G BID.   DVT prophylaxis: Heparin Code Status: Full code Family Communication: Daughter and husband at bedside. Disposition Plan: remains inpatient, continue telemetry monitoring, complete stroke work up and follow neurology rec's. Will need SNF for rehab at discharge.   Consultants:   Neurology   Procedures:   See below for x-ray reports   Carotid duplex: -Color duplex indicates minimal heterogeneous and calcified plaque, with no hemodynamically significant stenosis by duplex criteria in the extracranial cerebrovascular circulation.   2D echo: - Left ventricle: The cavity size was normal. Wall thickness was   increased in a pattern of moderate LVH. Systolic function was   vigorous. The estimated ejection fraction was in the range of 65%   to 70%. Wall motion was normal; there were no regional wall   motion abnormalities. Doppler parameters are consistent with   abnormal left ventricular relaxation (grade 1 diastolic   dysfunction). - Aortic valve: Mildly calcified annulus. Trileaflet; mildly   thickened leaflets. - Mitral valve: Mildly calcified annulus. Normal thickness leaflets. - Systemic veins: IVC is small, suggesting low RA pressure and   hypovolemia. - Technically adequate study.   Antimicrobials:  Anti-infectives (From admission, onward)   None     Subjective: Afebrile, no chest pain, no nausea, no vomiting.  Continued to have right-sided hemiparesis.  Objective: Vitals:   11/26/17 0220 11/26/17 0625 11/26/17 1021 11/26/17 1420  BP: (!) 157/77 (!) 169/78 (!) 192/88 (!) 211/95  Pulse: 90 93 83 86  Resp: 16 20 18 18   Temp: (!) 97.5 F (36.4 C) 97.6 F (36.4 C) 98.2 F (36.8 C) 98.3 F (36.8 C)  TempSrc: Oral Oral  Oral Oral  SpO2: 99% 100% 98% 98%  Weight:      Height:        Intake/Output Summary (Last 24 hours) at 11/26/2017  1635 Last data filed at 11/26/2017 1400 Gross per 24 hour  Intake 1771.67 ml  Output -  Net 1771.67 ml   Filed Weights   11/25/17 0634  Weight: 77.1 kg (170 lb)    Examination: General exam: Alert, awake, oriented x 3; in no distress with improvement in her dysarthria.  Denies nausea, no vomiting, no chest pain or shortness of breath.  Patient continued to experience mild facial droop and right-sided hemiparesis. Respiratory system: Clear to auscultation. Respiratory effort normal. Cardiovascular system: RRR. No murmurs, rubs, gallops. Gastrointestinal system: Abdomen is nondistended, soft and nontender. No organomegaly or masses felt. Normal bowel sounds heard. Central nervous system: Alert and oriented.  Positive right-sided hemiparesis, mild facial droop.  Left upper and lower extremities without any deficits.  Improvement in patient's prior seen dysarthria. Extremities: No cyanosis, clubbing or edema. Skin: No rashes, open wounds, induration or petechiae. Psychiatry: Judgement and insight appear normal. Mood & affect appropriate.    Data Reviewed: I have personally reviewed following labs and imaging studies  CBC: Recent Labs  Lab 11/25/17 0651 11/26/17 0631  WBC 6.5 7.4  NEUTROABS 3.3  --   HGB 14.0 13.8  HCT 41.3 40.5  MCV 91.4 91.4  PLT 202 235   Basic Metabolic Panel: Recent Labs  Lab 11/25/17 0651 11/26/17 0631  NA 137 140  K 3.7 3.7  CL 105 110  CO2 21* 24  GLUCOSE 308* 240*  BUN 18 19  CREATININE 0.87 0.89  CALCIUM 9.6 9.2   GFR: Estimated Creatinine Clearance: 63.6 mL/min (by C-G formula based on SCr of 0.89 mg/dL).   Liver Function Tests: Recent Labs  Lab 11/25/17 0651  AST 35  ALT 34  ALKPHOS 87  BILITOT 0.6  PROT 7.2  ALBUMIN 3.4*   Coagulation Profile: Recent Labs  Lab 11/25/17 0651  INR 1.03   HbA1C: Recent Labs    11/25/17 0651 11/26/17 0631  HGBA1C 9.6* 9.5*   CBG: Recent Labs  Lab 11/25/17 2222 11/25/17 2356  11/26/17 0547 11/26/17 1146 11/26/17 1610  GLUCAP 157* 165* 222* 251* 223*   Lipid Profile: Recent Labs    11/26/17 0631  CHOL 218*  HDL 25*  LDLCALC 121*  TRIG 360*  CHOLHDL 8.7   Thyroid Function Tests: Recent Labs    11/25/17 0651  TSH 8.579*   Urine analysis:    Component Value Date/Time   COLORURINE YELLOW 11/25/2017 0747   APPEARANCEUR CLEAR 11/25/2017 0747   LABSPEC 1.020 11/25/2017 0747   PHURINE 6.0 11/25/2017 0747   GLUCOSEU >=500 (A) 11/25/2017 0747   HGBUR NEGATIVE 11/25/2017 0747   BILIRUBINUR NEGATIVE 11/25/2017 0747   KETONESUR 5 (A) 11/25/2017 0747   PROTEINUR >=300 (A) 11/25/2017 0747   UROBILINOGEN 0.2 11/08/2014 1915   NITRITE NEGATIVE 11/25/2017 0747   LEUKOCYTESUR NEGATIVE 11/25/2017 0747    Radiology Studies: Ct Head Wo Contrast  Result Date: 11/25/2017 CLINICAL DATA:  Right-sided weakness for several hours EXAM: CT HEAD WITHOUT CONTRAST TECHNIQUE: Contiguous axial images were obtained from the base of the skull through the vertex without intravenous contrast. COMPARISON:  06/13/2017 FINDINGS: Brain: Scattered areas of decreased attenuation are noted in the deep white matter on the left. Similar changes are noted in the anterior limb of the internal capsule on right. These changes are new from the prior  exam and likely related to chronic white matter ischemic change. No definitive acute ischemia is seen. No hemorrhage or space-occupying mass lesion is noted. Vascular: No hyperdense vessel or unexpected calcification. Skull: Normal. Negative for fracture or focal lesion. Sinuses/Orbits: No acute finding. Other: None. IMPRESSION: Likely subacute to chronic ischemia particularly on the left in the deep white matter. Given the patient's clinical history. MRI is recommended for further evaluation. Electronically Signed   By: Alcide CleverMark  Lukens M.D.   On: 11/25/2017 07:33   Mr Brain Wo Contrast  Result Date: 11/26/2017 CLINICAL DATA:  Right-sided weakness for 2  days. EXAM: MRI HEAD WITHOUT CONTRAST TECHNIQUE: Multiplanar, multiecho pulse sequences of the brain and surrounding structures were obtained without intravenous contrast. COMPARISON:  Head CT 11/25/2017 FINDINGS: Brain: There is an acute infarct involving the posterior left lentiform nucleus and corona radiata. An adjacent infarct immediately anterior to this has a subacute appearance. No intracranial hemorrhage, mass, midline shift, or extra-axial fluid collection is identified. The ventricles and sulci are within normal limits for age. Small foci of T2 hyperintensity scattered throughout the subcortical and deep cerebral white matter and pons are nonspecific but compatible with mild chronic small vessel ischemic disease. Chronic lacunar infarcts are noted in the right basal ganglia, right internal capsule, and thalami. Vascular: Major intracranial vascular flow voids are preserved. Skull and upper cervical spine: No suspicious marrow lesion. Sinuses/Orbits: Unremarkable orbits. Paranasal sinuses and mastoid air cells are clear. Other: None. IMPRESSION: 1. Acute and subacute left lateral lenticulostriate territory infarcts. 2. Chronic small vessel ischemia with multiple chronic deep gray nuclei and white matter infarcts. Electronically Signed   By: Sebastian AcheAllen  Grady M.D.   On: 11/26/2017 10:05   [place] Carotid Bilateral (at [facility] And [facility] Only)  Result Date: 11/26/2017 CLINICAL DATA:  62 year old female with a history of right-sided weakness, stroke. Cardiovascular risk factors include hypertension, known prior stroke/TIA, hyperlipidemia, tobacco use, diabetes EXAM: BILATERAL CAROTID DUPLEX ULTRASOUND TECHNIQUE: Wallace CullensGray scale imaging, color Doppler and duplex ultrasound were performed of bilateral carotid and vertebral arteries in the neck. COMPARISON:  No prior duplex FINDINGS: Criteria: Quantification of carotid stenosis is based on velocity parameters that correlate the residual internal carotid diameter with NASCET-based  stenosis levels, using the diameter of the distal internal carotid lumen as the denominator for stenosis measurement. The following velocity measurements were obtained: RIGHT ICA:  Systolic 104 cm/sec, Diastolic 22 cm/sec CCA:  115 cm/sec SYSTOLIC ICA/CCA RATIO:  0.9 ECA:  174 cm/sec LEFT ICA:  Systolic 101 cm/sec, Diastolic 26 cm/sec CCA:  144 cm/sec SYSTOLIC ICA/CCA RATIO:  0.7 ECA:  123 cm/sec Right Brachial SBP: Not acquired Left Brachial SBP: Not acquired RIGHT CAROTID ARTERY: No significant calcifications of the right common carotid artery. Intermediate waveform maintained. Heterogeneous and partially calcified plaque at the right carotid bifurcation. No significant lumen shadowing. Low resistance waveform of the right ICA. No significant tortuosity. RIGHT VERTEBRAL ARTERY: Antegrade flow with low resistance waveform. LEFT CAROTID ARTERY: No significant calcifications of the left common carotid artery. Intermediate waveform maintained. Heterogeneous and partially calcified plaque at the left carotid bifurcation without significant lumen shadowing. Low resistance waveform of the left ICA. No significant tortuosity. LEFT VERTEBRAL ARTERY:  Antegrade flow with low resistance waveform. IMPRESSION: Color duplex indicates minimal heterogeneous and calcified plaque, with no hemodynamically significant stenosis by duplex criteria in the extracranial cerebrovascular circulation. Signed, Yvone NeuJaime S. Reyne DumasWagner, DO, RPVI [facility] Electronically Signed   By: Gilmer MorJaime  Wagner D.O.   On:  11/26/2017 09:44   Mr Maxine Glenn Head/brain ZO Cm  Result Date: 11/26/2017 CLINICAL DATA:  Right-sided weakness for 2 days. EXAM: MRA HEAD WITHOUT CONTRAST TECHNIQUE: Angiographic images of the Circle of Willis were obtained using MRA technique without intravenous contrast. COMPARISON:  None. FINDINGS: The intracranial vertebral arteries are patent to the basilar and codominant. The left PICA  and right AICA appear dominant. Patent SCA origins are visualized bilaterally. The basilar artery is widely patent. There is a large right posterior communicating artery. The PCAs are patent with multifocal severe P2 stenoses bilaterally. The internal carotid arteries are patent from skull base to carotid termini with mild left ICA stenosis near the petrous-cavernous junction. ACAs and MCAs are patent without evidence of significant A1 or M1 stenosis or proximal branch occlusion. There is a severe mid left M2 branch vessel stenosis, and there is a moderate distal left A2 stenosis. No aneurysm is identified. IMPRESSION: 1. No large vessel occlusion. 2. Intracranial atherosclerosis as above including severe bilateral P2 stenoses. Electronically Signed   By: Sebastian Ache M.D.   On: 11/26/2017 09:55    Scheduled Meds: . aspirin  325 mg Oral Daily  . atorvastatin  40 mg Oral q1800  . heparin  5,000 Units Subcutaneous Q8H  . insulin aspart  0-9 Units Subcutaneous Q6H  . nicotine  21 mg Transdermal Daily  . pantoprazole  40 mg Oral Daily   Continuous Infusions: . sodium chloride 50 mL/hr at 11/26/17 0623     LOS: 1 day    Time spent: 35 minutes.    Vassie Loll, MD [facility] Pager [phone]  If 7PM-7AM, please contact night-coverage [email or web address] Password [facility] 11/26/2017, 4:35 PM

## 2017-11-26 NOTE — Evaluation (Signed)
Clinical/Bedside Swallow Evaluation Patient Details  Name: [name] MRN: [id number] Date of Birth: 03/28/1956  Today's Date: 11/26/2017 Time: SLP Start Time (ACUTE ONLY): 1122 SLP Stop Time (ACUTE ONLY): 1142 SLP Time Calculation (min) (ACUTE ONLY): 20 min  Past Medical History:  Past Medical History:  Diagnosis Date  . Diabetes mellitus without complication (HCC)   . Diverticulitis   . Fibromyalgia   . Hyperlipidemia   . Hypertension   . Papule 03/22/2015   Past Surgical History:  Past Surgical History:  Procedure Laterality Date  . CHOLECYSTECTOMY    . KNEE SURGERY Left    HPI:  Michele T Manringis a 62 y.o.femalewith past medical history significant for hypertension, hyperlipidemia,type 2 diabetes mellitus, tobacco abuse and fibromyalgia; who presented to the emergency department secondary to right-sided weakness. Patient was last seen normal Friday night; on Saturday she woke up with right weakness and not feeling good. She thought was secondary to her fibromyalgia and didn't seek medical attention. On Sunday morning her symptoms persist and she came to ED for further evaluation and treatment.She is having a dense right side hemiparesis mild facial droop and mild dysarthria. She denies chest pain, shortness of breath, nausea, vomiting, dysuria, melena, hematochezia, hematuria, fever, chills, headaches or any other complaints. In ED,mild hyperglycemia, CT scan without contrast demonstrating subacute to chronic ischemia particularly on the left in the deep white matter.Tele-neurology consulted and recommended admission for non-emergent stroke workup; patient is out of therapeutic window for any intervention.MRI shows Acute and subacute left lateral lenticulostriate territory infarcts. BSE and SLE requested.   Assessment / Plan / Recommendation Clinical Impression  Clinical swallow evaluation completed while Pt sitting upright in recliner with husband and friend  present. Oral motor examination reveals mild/mod right facial asymmetry and weakness with reduced sensation. Lingual strength and ROM appear WFL. Pt able to produce strong cough upon request. Pt assessed with ice chips, water via cup/straw, puree, and graham crackers. Pt without overt signs or symptoms of aspiration, however she does have mild right labial spillage with reduced sensation when consuming regular textures. Pt also with dysarthria and is aware of same. Recommend D3/mech soft and thin liquids with aspiration precautions and no straws; supervision with meals with either family or staff. SLP will follow with SLE and recommend SNF.   SLP Visit Diagnosis: Dysphagia, oral phase (R13.11)    Aspiration Risk  Mild aspiration risk    Diet Recommendation Dysphagia 3 (Mech soft);Thin liquid   Liquid Administration via: Cup;No straw Medication Administration: Whole meds with liquid Supervision: Patient able to self feed;Full supervision/cueing for compensatory strategies Compensations: Slow rate;Small sips/bites;Lingual sweep for clearance of pocketing Postural Changes: Seated upright at 90 degrees;Remain upright for at least 30 minutes after po intake    Other  Recommendations Oral Care Recommendations: Oral care BID;Staff/trained caregiver to provide oral care Other Recommendations: Clarify dietary restrictions   Follow up Recommendations Skilled Nursing facility      Frequency and Duration min 2x/week  1 week       Prognosis Prognosis for Safe Diet Advancement: Good      Swallow Study   General Date of Onset: 11/25/17 HPI: Michele T Manringis a 62 y.o.femalewith past medical history significant for hypertension, hyperlipidemia,type 2 diabetes mellitus, tobacco abuse and fibromyalgia; who presented to the emergency department secondary to right-sided weakness. Patient was last seen normal Friday night; on Saturday she woke up with right weakness and not feeling good. She  thought was secondary to her fibromyalgia and didn't  seek medical attention. On Sunday morning her symptoms persist and she came to ED for further evaluation and treatment.She is having a dense right side hemiparesis mild facial droop and mild dysarthria. She denies chest pain, shortness of breath, nausea, vomiting, dysuria, melena, hematochezia, hematuria, fever, chills, headaches or any other complaints. In ED,mild hyperglycemia, CT scan without contrast demonstrating subacute to chronic ischemia particularly on the left in the deep white matter.Tele-neurology consulted and recommended admission for non-emergent stroke workup; patient is out of therapeutic window for any intervention.MRI shows Acute and subacute left lateral lenticulostriate territory infarcts. BSE and SLE requested. Type of Study: Bedside Swallow Evaluation Previous Swallow Assessment: None on record Diet Prior to this Study: NPO Temperature Spikes Noted: No Respiratory Status: Room air History of Recent Intubation: No Behavior/Cognition: Alert;Cooperative;Pleasant mood Oral Cavity Assessment: Within Functional Limits Oral Care Completed by SLP: Yes Oral Cavity - Dentition: Adequate natural dentition Vision: Functional for self-feeding Self-Feeding Abilities: Able to feed self;Needs assist(RUE weakness) Patient Positioning: Upright in chair Baseline Vocal Quality: Normal Volitional Cough: Strong Volitional Swallow: Able to elicit    Oral/Motor/Sensory Function Overall Oral Motor/Sensory Function: Mild impairment Facial ROM: Reduced right Facial Symmetry: Abnormal symmetry right Facial Strength: Reduced right Facial Sensation: Reduced right Lingual ROM: Within Functional Limits Lingual Symmetry: Within Functional Limits Lingual Strength: Within Functional Limits Lingual Sensation: Reduced Velum: Within Functional Limits Mandible: Within Functional Limits   Ice Chips Ice chips: Within functional  limits Presentation: Spoon   Thin Liquid Thin Liquid: Within functional limits Presentation: Cup;Self Fed;Straw    Nectar Thick Nectar Thick Liquid: Not tested   Honey Thick Honey Thick Liquid: Not tested   Puree Puree: Within functional limits Presentation: Spoon   Solid     Solid: Impaired Presentation: Self Fed Oral Phase Impairments: Reduced labial seal Oral Phase Functional Implications: Oral residue;Right anterior spillage     Thank you,  Havery Moros, CCC-SLP [phone]  Syair Fricker 11/26/2017,12:17 PM

## 2017-11-26 NOTE — Clinical Social Work Placement (Signed)
   CLINICAL SOCIAL WORK PLACEMENT  NOTE  Date:  11/26/2017  Patient Details  Name: Forbes CellarVirginia T Rossi MRN: [id number] Date of Birth: 08/03/1955  Clinical Social Work is seeking post-discharge placement for this patient at the Skilled  Nursing Facility level of care (*CSW will initial, date and re-position this form in  chart as items are completed):  Yes   Patient/family provided with [facility] Clinical Social Work Department's list of facilities offering this level of care within the geographic area requested by the patient (or if unable, by the patient's family).  Yes   Patient/family informed of their freedom to choose among providers that offer the needed level of care, that participate in Medicare, Medicaid or managed care program needed by the patient, have an available bed and are willing to accept the patient.  Yes   Patient/family informed of [facility]'s ownership interest in [facility] and [facility], as well as of the fact that they are under no obligation to receive care at these facilities.  PASRR submitted to EDS on 11/26/17     PASRR number received on 11/26/17     Existing PASRR number confirmed on       FL2 transmitted to all facilities in geographic area requested by pt/family on 11/26/17     FL2 transmitted to all facilities within larger geographic area on       Patient informed that his/her managed care company has contracts with or will negotiate with certain facilities, including the following:            Patient/family informed of bed offers received.  Patient chooses bed at       Physician recommends and patient chooses bed at      Patient to be transferred to   on  .  Patient to be transferred to facility by       Patient family notified on   of transfer.  Name of family member notified:        PHYSICIAN       Additional Comment:    _______________________________________________ Annice NeedySettle, Apolonio Cutting D, LCSW 11/26/2017, 2:36 PM

## 2017-11-26 NOTE — Progress Notes (Signed)
Physical Therapy Treatment Patient Details Name: [name] MRN: [id number] DOB: [birth year] Today's Date: 11/26/2017    History of Present Illness [name] is a 62 y.o. female with past medical history significant for hypertension, hyperlipidemia, type 2 diabetes mellitus, tobacco abuse and fibromyalgia; who presented to the emergency department secondary to right-sided weakness.  Patient was last seen normal Friday night; on Saturday she woke up with right weakness and not feeling good. She thought was secondary to her fibromyalgia and didn't seek medical attention. On Sunday morning her symptoms persist and she came to ED for further evaluation and treatment.     PT Comments    Patient demonstrated improvement for trunk control/maintaining sitting balance while performing BUE/LE exercises, required active assistance for all right sided exercises, unable to grip with right hand or extend fingers, able to take a few side steps with quad-cane with Max verbal/tactile cueing and assistance to move RLE.  Patient tolerated sitting up in chair after therapy - nursing staff aware.  Patient will benefit from continued physical therapy in hospital and recommended venue below to increase strength, balance, endurance for safe ADLs and gait.    Follow Up Recommendations  SNF     Equipment Recommendations  Cane(quad cane)    Recommendations for Other Services       Precautions / Restrictions Precautions Precautions: Fall Restrictions Weight Bearing Restrictions: No    Mobility  Bed Mobility Overal bed mobility: Needs Assistance Bed Mobility: Supine to Sit     Supine to sit: Mod assist     General bed mobility comments: verbal/tactile cues to prop up on left elbow for supine to sit with fair carroyover, had to pull up using LUE  Transfers Overall transfer level: Needs assistance Equipment used: Quad cane Transfers: Sit to/from [employer] Sit to Stand:  Mod assist Stand pivot transfers: Mod assist;Max assist       General transfer comment: required tactile cueing to move RLE while standing  Ambulation/Gait Ambulation/Gait assistance: Max assist Gait Distance (Feet): 4 Feet Assistive device: Quad cane Gait Pattern/deviations: Decreased step length - right;Decreased stance time - right;Decreased stride length;Shuffle Gait velocity: slow   General Gait Details: limited 6-7 slow labored shuffling steps with RLE requiring assistance to move RLE and to maintain standing balance   Stairs             Wheelchair Mobility    Modified Rankin (Stroke Patients Only)       Balance Overall balance assessment: Needs assistance Sitting-balance support: Feet supported;No upper extremity supported Sitting balance-Leahy Scale: Fair Sitting balance - Comments: fair/good, able to keep trunk centered while perforing exercises with BUE/LE's   Standing balance support: Single extremity supported;During functional activity Standing balance-Leahy Scale: Poor Standing balance comment: frequent assit to prevent falling to the right while using quad-cane                            Cognition Arousal/Alertness: Awake/alert Behavior During Therapy: WFL for tasks assessed/performed Overall Cognitive Status: Within Functional Limits for tasks assessed                                        Exercises General Exercises - Lower Extremity Long Arc Quad: Seated;AROM;AAROM;Strengthening;Both;10 reps Heel Slides: Supine;AROM;Strengthening;Right;10 reps Hip Flexion/Marching: Seated;AROM;AAROM;Strengthening;Both;10 reps Other Exercises Other Exercises: seated self assisted biceps curls x 10  reps Other Exercises: seated self assisted shoulder flexion x 10 reps    General Comments        Pertinent Vitals/Pain Pain Assessment: No/denies pain    Home Living     Available Help at Discharge: Family Type of Home:  House              Prior Function            PT Goals (current goals can now be found in the care plan section) Acute Rehab PT Goals Patient Stated Goal: return home after rehab PT Goal Formulation: With patient/family Time For Goal Achievement: 12/12/17 Potential to Achieve Goals: Good Progress towards PT goals: Progressing toward goals    Frequency    7X/week      PT Plan Current plan remains appropriate    Co-evaluation              AM-PAC PT "6 Clicks" Daily Activity  Outcome Measure  Difficulty turning over in bed (including adjusting bedclothes, sheets and blankets)?: A Lot Difficulty moving from lying on back to sitting on the side of the bed? : A Lot Difficulty sitting down on and standing up from a chair with arms (e.g., wheelchair, bedside commode, etc,.)?: A Lot Help needed moving to and from a bed to chair (including a wheelchair)?: A Lot Help needed walking in hospital room?: Total Help needed climbing 3-5 steps with a railing? : Total 6 Click Score: 10    End of Session Equipment Utilized During Treatment: Gait belt Activity Tolerance: Patient tolerated treatment well;Patient limited by fatigue Patient left: in chair;with call bell/phone within reach;with family/visitor present Nurse Communication: Mobility status;Other (comment)(nursing staff notified that patient left up in chair) PT Visit Diagnosis: Unsteadiness on feet (R26.81);Other abnormalities of gait and mobility (R26.89);Muscle weakness (generalized) (M62.81)     Time: [id number] PT Time Calculation (min) (ACUTE ONLY): 32 min  Charges:  $Therapeutic Exercise: 8-22 mins $Therapeutic Activity: 8-22 mins                     3:19 PM, 11/26/17 Ocie BobJames Rudransh Bellanca, MPT Physical Therapist with [facility] [phone] office [phone] mobile phone

## 2017-11-26 NOTE — Progress Notes (Signed)
Inpatient Diabetes Program Recommendations  AACE/ADA: New Consensus Statement on Inpatient Glycemic Control (2015)  Target Ranges:  Prepandial:   less than 140 mg/dL      Peak postprandial:   less than 180 mg/dL (1-2 hours)      Critically ill patients:  140 - 180 mg/dL   Lab Results  Component Value Date   GLUCAP 222 (H) 11/26/2017   HGBA1C 9.6 (H) 11/25/2017    Review of Glycemic Control Results for [name], [name] (MRN [id number]) as of 11/26/2017 09:49  Ref. Range 11/25/2017 18:38 11/25/2017 22:22 11/25/2017 23:56 11/26/2017 05:47  Glucose-Capillary Latest Ref Range: 70 - 99 mg/dL 914182 (H) 782157 (H) 956165 (H) 222 (H)   Diabetes history: DM2 Outpatient Diabetes medications: Soliqua 25 units qd (Lantus + Lixisenatide)+ Glipizide XL 10 qd + Metformin 500 mg bid Current orders for Inpatient glycemic control: Novolog sensitive q 6 hrs.  Inpatient Diabetes Program Recommendations:   While in the hospital: -Lantus 12 units (50% home dose)  Thank you, Billy FischerJudy E. Ronson Hagins, RN, MSN, CDE  Diabetes Coordinator Inpatient Glycemic Control Team Team Pager [phone] (8am-5pm) 11/26/2017 9:58 AM

## 2017-11-26 NOTE — NC FL2 (Signed)
[place] MEDICAID FL2 LEVEL OF CARE SCREENING TOOL     IDENTIFICATION  Patient Name: [name] Birthdate: 1955/11/23 Sex: female Admission Date (Current Location): 11/25/2017  [facility] and [place] Number:  [employer] and Address:  [facility],  [address], [place] [address]      Provider Number: [id number]  Attending Physician Name and Address:  Vassie Loll, MD  Relative Name and Phone Number:       Current Level of Care: Hospital Recommended Level of Care: Skilled Nursing Facility Prior Approval Number:    Date Approved/Denied:   PASRR Number:    Discharge Plan: SNF    Current Diagnoses: Patient Active Problem List   Diagnosis Date Noted  . Stroke-like symptoms 11/25/2017  . Type 2 diabetes mellitus with hyperglycemia (HCC) 11/25/2017  . HLD (hyperlipidemia) 11/25/2017  . Fibromyalgia 11/25/2017  . Depression 11/25/2017  . Tobacco abuse 11/25/2017  . Papule 03/22/2015  . Hypertension   . Acute diverticulitis 11/08/2014  . Diabetes (HCC) 11/08/2014  . Renal insufficiency 11/08/2014  . Abdominal pain 11/08/2014  . Left leg weakness 04/10/2011  . Abnormal gait 04/10/2011  . S/P total knee replacement 12/28/2010  . Knee pain 12/28/2010  . Knee stiffness 12/28/2010    Orientation RESPIRATION BLADDER Height & Weight     Self, Time, Situation, Place  Normal Continent Weight: 170 lb (77.1 kg) Height:  5\' 3"  (160 cm)  BEHAVIORAL SYMPTOMS/MOOD NEUROLOGICAL BOWEL NUTRITION STATUS      Continent Diet(DYS 3)  AMBULATORY STATUS COMMUNICATION OF NEEDS Skin   Extensive Assist Verbally Normal                       Personal Care Assistance Level of Assistance  Bathing, Feeding, Dressing Bathing Assistance: Limited assistance Feeding assistance: Independent Dressing Assistance: Limited assistance     Functional Limitations Info  Sight, Hearing, Speech Sight Info: Adequate Hearing Info: Adequate Speech Info: Adequate     SPECIAL CARE FACTORS FREQUENCY  PT (By licensed PT), OT (By licensed OT), Speech therapy     PT Frequency: 5x/week OT Frequency: 3x/week     Speech Therapy Frequency: 3x/week      Contractures Contractures Info: Not present    Additional Factors Info  Code Status, Allergies Code Status Info: Full code Allergies Info: Bee Pollen, Codeine           Current Medications (11/26/2017):  This is the current hospital active medication list Current Facility-Administered Medications  Medication Dose Route Frequency Provider Last Rate Last Dose  . 0.9 %  sodium chloride infusion   Intravenous Continuous Vassie Loll, MD 50 mL/hr at 11/26/17 [phone]    . acetaminophen (TYLENOL) tablet 650 mg  650 mg Oral Q4H PRN Vassie Loll, MD       Or  . acetaminophen (TYLENOL) solution 650 mg  650 mg Per Tube Q4H PRN Vassie Loll, MD       Or  . acetaminophen (TYLENOL) suppository 650 mg  650 mg Rectal Q4H PRN Vassie Loll, MD      . aspirin suppository 300 mg  300 mg Rectal Daily Vassie Loll, MD   300 mg at 11/26/17 1478   Or  . aspirin tablet 325 mg  325 mg Oral Daily Vassie Loll, MD      . famotidine (PEPCID) IVPB 20 mg premix  20 mg Intravenous Q24H Vassie Loll, MD   Stopped at 11/26/17 1232  . heparin injection 5,000 Units  5,000  Units Subcutaneous Q8H Vassie LollMadera, Carlos, MD   5,000 Units at 11/26/17 1324  . hydrALAZINE (APRESOLINE) injection 10 mg  10 mg Intravenous Q8H PRN Vassie LollMadera, Carlos, MD   10 mg at 11/25/17 2235  . insulin aspart (novoLOG) injection 0-9 Units  0-9 Units Subcutaneous Q6H Vassie LollMadera, Carlos, MD   5 Units at 11/26/17 1202  . nicotine (NICODERM CQ - dosed in mg/24 hours) patch 21 mg  21 mg Transdermal Daily Vassie LollMadera, Carlos, MD   21 mg at 11/26/17 0806  . ondansetron (ZOFRAN) injection 4 mg  4 mg Intravenous Q6H PRN Vassie LollMadera, Carlos, MD         Discharge Medications: Please see discharge summary for a list of discharge medications.  Relevant Imaging  Results:  Relevant Lab Results:   Additional Information SSN [id number][id number]  [name], Michele ChinaHeather D, LCSW

## 2017-11-26 NOTE — Progress Notes (Signed)
*  PRELIMINARY RESULTS* Echocardiogram 2D Echocardiogram has been performed.  [name], [name] 11/26/2017, 2:40 PM

## 2017-11-26 NOTE — Progress Notes (Signed)
OT Cancellation Note  Patient Details Name: Forbes CellarVirginia T Dahle MRN: [id number] DOB: 01/15/1956   Cancelled Treatment:    Reason Eval/Treat Not Completed: Patient at procedure or test/ unavailable   Limmie PatriciaLaura Abdallah Hern, OTR/L,CBIS  [phone]  11/26/2017, 8:42 AM

## 2017-11-27 ENCOUNTER — Inpatient Hospital Stay
Admission: RE | Admit: 2017-11-27 | Discharge: 2018-01-01 | Disposition: A | Discharge status: Home / Self Care | Payer: Medicare HMO | Source: Ambulatory Visit | Attending: Internal Medicine | Admitting: Internal Medicine

## 2017-11-27 ENCOUNTER — Encounter: Payer: Self-pay | Admitting: Internal Medicine

## 2017-11-27 ENCOUNTER — Non-Acute Institutional Stay (SKILLED_NURSING_FACILITY): Payer: Medicare HMO | Admitting: Internal Medicine

## 2017-11-27 DIAGNOSIS — R262 Difficulty in walking, not elsewhere classified: Secondary | ICD-10-CM | POA: Diagnosis not present

## 2017-11-27 DIAGNOSIS — I69392 Facial weakness following cerebral infarction: Secondary | ICD-10-CM | POA: Diagnosis not present

## 2017-11-27 DIAGNOSIS — R001 Bradycardia, unspecified: Secondary | ICD-10-CM | POA: Diagnosis not present

## 2017-11-27 DIAGNOSIS — E039 Hypothyroidism, unspecified: Secondary | ICD-10-CM | POA: Diagnosis not present

## 2017-11-27 DIAGNOSIS — I639 Cerebral infarction, unspecified: Secondary | ICD-10-CM

## 2017-11-27 DIAGNOSIS — K219 Gastro-esophageal reflux disease without esophagitis: Secondary | ICD-10-CM | POA: Diagnosis not present

## 2017-11-27 DIAGNOSIS — N289 Disorder of kidney and ureter, unspecified: Secondary | ICD-10-CM | POA: Diagnosis not present

## 2017-11-27 DIAGNOSIS — Z794 Long term (current) use of insulin: Secondary | ICD-10-CM | POA: Diagnosis not present

## 2017-11-27 DIAGNOSIS — E1165 Type 2 diabetes mellitus with hyperglycemia: Secondary | ICD-10-CM | POA: Diagnosis not present

## 2017-11-27 DIAGNOSIS — I10 Essential (primary) hypertension: Secondary | ICD-10-CM | POA: Diagnosis not present

## 2017-11-27 DIAGNOSIS — F32A Depression, unspecified: Secondary | ICD-10-CM

## 2017-11-27 DIAGNOSIS — E785 Hyperlipidemia, unspecified: Secondary | ICD-10-CM | POA: Diagnosis not present

## 2017-11-27 DIAGNOSIS — E119 Type 2 diabetes mellitus without complications: Secondary | ICD-10-CM | POA: Diagnosis not present

## 2017-11-27 DIAGNOSIS — R2990 Unspecified symptoms and signs involving the nervous system: Secondary | ICD-10-CM

## 2017-11-27 DIAGNOSIS — Z8673 Personal history of transient ischemic attack (TIA), and cerebral infarction without residual deficits: Secondary | ICD-10-CM | POA: Diagnosis not present

## 2017-11-27 DIAGNOSIS — R1310 Dysphagia, unspecified: Secondary | ICD-10-CM | POA: Diagnosis not present

## 2017-11-27 DIAGNOSIS — I82409 Acute embolism and thrombosis of unspecified deep veins of unspecified lower extremity: Secondary | ICD-10-CM | POA: Diagnosis not present

## 2017-11-27 DIAGNOSIS — F329 Major depressive disorder, single episode, unspecified: Secondary | ICD-10-CM | POA: Diagnosis not present

## 2017-11-27 DIAGNOSIS — R69 Illness, unspecified: Secondary | ICD-10-CM | POA: Diagnosis not present

## 2017-11-27 DIAGNOSIS — R600 Localized edema: Secondary | ICD-10-CM | POA: Diagnosis not present

## 2017-11-27 DIAGNOSIS — R279 Unspecified lack of coordination: Secondary | ICD-10-CM | POA: Diagnosis not present

## 2017-11-27 DIAGNOSIS — Z720 Tobacco use: Secondary | ICD-10-CM | POA: Diagnosis not present

## 2017-11-27 DIAGNOSIS — E109 Type 1 diabetes mellitus without complications: Secondary | ICD-10-CM | POA: Diagnosis not present

## 2017-11-27 DIAGNOSIS — Z96659 Presence of unspecified artificial knee joint: Secondary | ICD-10-CM | POA: Diagnosis not present

## 2017-11-27 DIAGNOSIS — M797 Fibromyalgia: Secondary | ICD-10-CM | POA: Diagnosis not present

## 2017-11-27 DIAGNOSIS — I69322 Dysarthria following cerebral infarction: Secondary | ICD-10-CM | POA: Diagnosis not present

## 2017-11-27 DIAGNOSIS — R1312 Dysphagia, oropharyngeal phase: Secondary | ICD-10-CM | POA: Diagnosis not present

## 2017-11-27 DIAGNOSIS — M79671 Pain in right foot: Secondary | ICD-10-CM | POA: Diagnosis not present

## 2017-11-27 DIAGNOSIS — I499 Cardiac arrhythmia, unspecified: Secondary | ICD-10-CM | POA: Diagnosis not present

## 2017-11-27 DIAGNOSIS — R609 Edema, unspecified: Principal | ICD-10-CM

## 2017-11-27 DIAGNOSIS — I69391 Dysphagia following cerebral infarction: Secondary | ICD-10-CM | POA: Diagnosis not present

## 2017-11-27 DIAGNOSIS — M6281 Muscle weakness (generalized): Secondary | ICD-10-CM | POA: Diagnosis not present

## 2017-11-27 DIAGNOSIS — I69351 Hemiplegia and hemiparesis following cerebral infarction affecting right dominant side: Secondary | ICD-10-CM | POA: Diagnosis not present

## 2017-11-27 LAB — CBC
HCT: 40.9 % (ref 36.0–46.0)
Hemoglobin: 14 g/dL (ref 12.0–15.0)
MCH: 31.3 pg (ref 26.0–34.0)
MCHC: 34.2 g/dL (ref 30.0–36.0)
MCV: 91.5 fL (ref 78.0–100.0)
PLATELETS: 229 10*3/uL (ref 150–400)
RBC: 4.47 MIL/uL (ref 3.87–5.11)
RDW: 14.2 % (ref 11.5–15.5)
WBC: 7.5 10*3/uL (ref 4.0–10.5)

## 2017-11-27 LAB — T4, FREE: Free T4: 1.05 ng/dL (ref 0.82–1.77)

## 2017-11-27 LAB — BASIC METABOLIC PANEL
Anion gap: 10 (ref 5–15)
BUN: 22 mg/dL (ref 8–23)
CO2: 21 mmol/L — ABNORMAL LOW (ref 22–32)
CREATININE: 1.07 mg/dL — AB (ref 0.44–1.00)
Calcium: 9.1 mg/dL (ref 8.9–10.3)
Chloride: 107 mmol/L (ref 98–111)
GFR calc Af Amer: >60 mL/min (ref >60)
GFR, EST NON AFRICAN AMERICAN: 54 mL/min — AB (ref >60)
GLUCOSE: 288 mg/dL — AB (ref 70–99)
POTASSIUM: 3.8 mmol/L (ref 3.5–5.1)
Sodium: 138 mmol/L (ref 135–145)

## 2017-11-27 LAB — GLUCOSE, CAPILLARY
GLUCOSE-CAPILLARY: 245 mg/dL — AB (ref 70–99)
Glucose-Capillary: 270 mg/dL — ABNORMAL HIGH (ref 70–99)
Glucose-Capillary: 243 mg/dL — ABNORMAL HIGH (ref 70–99)

## 2017-11-27 MED ORDER — INSULIN DETEMIR 100 UNIT/ML ~~LOC~~ SOLN
25.0000 [IU] | Freq: Every day | SUBCUTANEOUS | Status: DC
  Filled 2017-11-27: qty 0.25

## 2017-11-27 MED ORDER — ZOLPIDEM TARTRATE 10 MG PO TABS: 10.0000 mg | ORAL_TABLET | Freq: Every evening | ORAL | 0 refills | Status: DC | PRN

## 2017-11-27 MED ORDER — OMEGA-3-ACID ETHYL ESTERS 1 G PO CAPS: 1.0000 g | ORAL_CAPSULE | Freq: Two times a day (BID) | ORAL | Status: AC

## 2017-11-27 MED ORDER — METFORMIN HCL 1000 MG PO TABS: 1000.0000 mg | ORAL_TABLET | Freq: Two times a day (BID) | ORAL | Status: AC

## 2017-11-27 MED ORDER — NICOTINE 21 MG/24HR TD PT24: 21.0000 mg | MEDICATED_PATCH | Freq: Every day | TRANSDERMAL | Status: DC

## 2017-11-27 MED ORDER — ATORVASTATIN CALCIUM 40 MG PO TABS: 40.0000 mg | ORAL_TABLET | Freq: Every day | ORAL | Status: DC

## 2017-11-27 MED ORDER — INSULIN DETEMIR 100 UNIT/ML ~~LOC~~ SOLN: 30.0000 [IU] | Freq: Every day | SUBCUTANEOUS | Status: AC

## 2017-11-27 MED ORDER — ASPIRIN 325 MG PO TABS: 325.0000 mg | ORAL_TABLET | Freq: Every day | ORAL | Status: DC

## 2017-11-27 MED ORDER — PANTOPRAZOLE SODIUM 40 MG PO TBEC: 40.0000 mg | DELAYED_RELEASE_TABLET | Freq: Every day | ORAL | Status: DC

## 2017-11-27 MED ORDER — LEVOTHYROXINE SODIUM 50 MCG PO TABS: 50.0000 ug | ORAL_TABLET | Freq: Every day | ORAL | Status: DC

## 2017-11-27 NOTE — Evaluation (Signed)
Occupational Therapy Evaluation Patient Details Name: [name] MRN: [id number] DOB: January 31, 1956 Today's Date: 11/27/2017    History of Present Illness [name] is a 62 y.o. female with past medical history significant for hypertension, hyperlipidemia, type 2 diabetes mellitus, tobacco abuse and fibromyalgia; who presented to the emergency department secondary to right-sided weakness.  Patient was last seen normal Friday night; on Saturday she woke up with right weakness and not feeling good. She thought was secondary to her fibromyalgia and didn't seek medical attention. On Sunday morning her symptoms persist and she came to ED for further evaluation and treatment. MRI positive for acute CVA   Clinical Impression   Pt received supine in bed, agreeable to OT evaluation this am. Pt with right hemiplegia, no active motor movement in RUE, does have muscle activation in trapezius. Pt with beginning scapular winging, is able to slightly improve with retraction and postural awareness. Pt requiring increased assistance with ADLs, transfers, and functional mobility tasks. Verbal and tactile cuing for awareness of body position and for using RLE during transfer task. Recommend SNF on discharge to improve safety and independence in ADL completion and functional mobility. Will continue to follow while in acute care.     Follow Up Recommendations  SNF    Equipment Recommendations  None recommended by OT       Precautions / Restrictions Precautions Precautions: Fall Restrictions Weight Bearing Restrictions: No      Mobility Bed Mobility Overal bed mobility: Needs Assistance Bed Mobility: Supine to Sit     Supine to sit: Mod assist        Transfers Overall transfer level: Needs assistance Equipment used: 1 person hand held assist Transfers: Sit to/from Stand;Stand Pivot Transfers Sit to Stand: Mod assist Stand pivot transfers: Mod assist;Max assist       General  transfer comment: required tactile cueing to move RLE while standing        ADL either performed or assessed with clinical judgement   ADL Overall ADL's : Needs assistance/impaired Eating/Feeding: Set up;Sitting Eating/Feeding Details (indicate cue type and reason): assistance with opening packages and preparing food Grooming: Set up;Sitting Grooming Details (indicate cue type and reason): Pt requiring set-up for opening items and preparing items. Completes task with non-dominant LUE, unable to use RUE to assist in any way             Lower Body Dressing: Total assistance;Bed level                 General ADL Comments: Pt requiring increased assistance due to right side weakness as well as balance deficits limiting standing tasks     Vision Baseline Vision/History: Wears glasses Wears Glasses: Reading only Patient Visual Report: No change from baseline Vision Assessment?: No apparent visual deficits            Pertinent Vitals/Pain Pain Assessment: No/denies pain     Hand Dominance Right   Extremity/Trunk Assessment Upper Extremity Assessment Upper Extremity Assessment: RUE deficits/detail RUE Deficits / Details: RUE strength 0/5, trapezius 2-/5. Slight winging of right scapula. Increased tone in RUE RUE Sensation: decreased light touch RUE Coordination: decreased fine motor;decreased gross motor   Lower Extremity Assessment Lower Extremity Assessment: Defer to PT evaluation   Cervical / Trunk Assessment Cervical / Trunk Assessment: Normal   Communication Communication Communication: Expressive difficulties   Cognition Arousal/Alertness: Awake/alert Behavior During Therapy: WFL for tasks assessed/performed Overall Cognitive Status: Within Functional Limits for tasks assessed  Home Living Family/patient expects to be discharged to:: Skilled nursing facility Living Arrangements:  Spouse/significant other Available Help at Discharge: Family Type of Home: House Home Access: Stairs to enter [employer] CorporationEntrance Stairs-Number of Steps: 4 Entrance Stairs-Rails: Right;Left;Can reach both Home Layout: One level     Bathroom Shower/Tub: Producer, television/film/videoWalk-in shower   Bathroom Toilet: Standard     Home Equipment: Cane - single point;Walker - 2 wheels          Prior Functioning/Environment Level of Independence: Independent        Comments: community ambulator, drives        OT Problem List: Decreased strength;Decreased activity tolerance;Impaired balance (sitting and/or standing);Decreased coordination;Decreased safety awareness;Decreased knowledge of use of DME or AE;Impaired UE functional use      OT Treatment/Interventions: Self-care/ADL training;Therapeutic exercise;Neuromuscular education;DME and/or AE instruction;Therapeutic activities;Patient/family education    OT Goals(Current goals can be found in the care plan section) Acute Rehab OT Goals Patient Stated Goal: return home after rehab OT Goal Formulation: With patient Time For Goal Achievement: 12/11/17 Potential to Achieve Goals: Good  OT Frequency: Min 2X/week    End of Session Equipment Utilized During Treatment: Gait belt  Activity Tolerance: Patient tolerated treatment well Patient left: in chair;with call bell/phone within reach  OT Visit Diagnosis: Muscle weakness (generalized) (M62.81);Hemiplegia and hemiparesis Hemiplegia - Right/Left: Right Hemiplegia - dominant/non-dominant: Dominant Hemiplegia - caused by: Cerebral infarction                Time: 1610-9604: 0738-0823 OT Time Calculation (min): 45 min Charges:  OT General Charges $OT Visit: 1 Visit OT Evaluation $OT Eval Moderate Complexity: 1 [address]Mod   Namya Voges, OTR/L  [phone] 11/27/2017, 9:02 AM

## 2017-11-27 NOTE — Progress Notes (Signed)
This is an acute visit.  Level of care is skilled.  Facility is [facility].  Chief complaint acute visit status post hospitalization for CVA.  History of present illness.  Patient is a pleasant 62 year old female who was recently hospitalized for an acute CVA with resulting right-sided weakness. She has a past history of hypertension as well as hyperlipidemia type 2 diabetes tobacco abuse and fibromyalgia.  She presented to the ER with right-sided weakness-apparently was starting to feel ill on Friday night Saturday woke up with the weakness thought it was secondary to her fibromyalgia.  Sunday morning symptoms persisted and she came to the ED for further evaluations.  She found to have a dense right-sided hemiparesis with mild facial droop mild dysarthria.  MRI in the hospital confirmed a left brain ischemic stroke.  Carotid Doppler showed mild plaque buildup-2D echo was negative for any source of emboli it did show grade 1 diastolic dysfunction and preserved ejection fraction.  Recommendation was to continue aspirin for secondary prevention- she was also started on Lipitor 40 mg p.o. her LDL was 121.  Hemoglobin A1c was 9.5 and adjustments were made with goal hemoglobin A1c to be less than 7- she is now Levemir 30 units as well as Glucophage her glipizide was discontinued  Her TSH also was elevated at 8.579 Synthroid was started free T4 was within normal range.  Regards to hypertension her home medication of Lotrel was restarted- blood pressure is somewhat elevated with systolic at 098 this evening I see it was 180 in the hospital suspect there is some loose control desired here with recent CVA But this will have to be monitored closely.  She does have a history of tobacco use and is on a nicotine patch.  She also continues on Lexapro for history of depression.  Currently she has no acute complaints says at times she will have a headache-does not complain of shortness of  breath dizziness or chest pain other than somewhat elevated blood pressure vital signs appear to be stable  Past Medical History  Diagnosis Date  . Diabetes mellitus without complication   . Hyperlipidemia   . Hypertension   . Fibromyalgia   . Diverticulitis          Past Surgical History  Procedure Laterality Date  . Knee surgery Left   . Cholecystectomy            Family History  Problem Relation Age of Onset  . Diabetes Mother   . Dementia Mother   . Hypertension Mother   . Alcohol abuse Father   . Cirrhosis Father   . Arthritis Sister     rheumatoid  . Diabetes Brother   . Hypertension Brother   . Diabetes Maternal Grandmother   . Fibromyalgia Sister   . Hypertension Sister   . Fibromyalgia Sister   . Hypertension Sister   . Diabetes Sister   . Hypertension Sister   . Cirrhosis Brother   . Cancer Brother     pancreatic   Social History:  reports that she quit smoking about 13 years ago. Her smoking use included Cigarettes. She has a 57 pack-year smoking history. She has never used smokeless tobacco. She reports that she drinks alcohol. She reports that she does not use illicit drugs.  Allergies:       Allergies  Allergen Reactions  . Bee Pollen Anaphylaxis and Swelling  . Codeine     REACTION: nauseated,claustrophobic   Medications reviewed   Commonly known as:  LOTREL Take 1 capsule by mouth daily.   aspirin 325 MG tablet Take 1 tablet (325 mg total) by mouth daily. Start taking on:  11/28/2017   atorvastatin 40 MG tablet Commonly known as:  LIPITOR Take 1 tablet (40 mg total) by mouth daily at 6 PM.   diclofenac sodium 1 % Gel Commonly known as:  VOLTAREN Apply 3 g to 3 large joints up to 3 times a day when necessary;  dispense 10 tubes with 1 refill   escitalopram 10 MG tablet Commonly known as:  LEXAPRO Take 10 mg by mouth daily.   fenofibrate 160 MG tablet Take 160 mg by mouth daily.     insulin detemir 100 UNIT/ML injection Commonly known as:  LEVEMIR Inject 0.3 mLs (30 Units total) into the skin daily. Start taking on:  11/28/2017   levothyroxine 50 MCG tablet Commonly known as:  SYNTHROID, LEVOTHROID Take 1 tablet (50 mcg total) by mouth daily before breakfast. Start taking on:  11/28/2017   metFORMIN 1000 MG tablet Commonly known as:  GLUCOPHAGE Take 1 tablet (1,000 mg total) by mouth 2 (two) times daily with a meal. What changed:    medication strength  how much to take  when to take this   nabumetone 500 MG tablet Commonly known as:  RELAFEN Take 1 tablet by mouth 2 (two) times daily.   nicotine 21 mg/24hr patch Commonly known as:  NICODERM CQ - dosed in mg/24 hours Place 1 patch (21 mg total) onto the skin daily. Start taking on:  11/28/2017   omega-3 acid ethyl esters 1 g capsule Commonly known as:  LOVAZA Take 1 capsule (1 g total) by mouth 2 (two) times daily.   ONETOUCH VERIO test strip Generic drug:  glucose blood USE 1 STRIP TO CHECK GLUCOSE THREE TIMES DAILY   ONETOUCH VERIO w/Device Kit USE TO CHECK GLUCOSE THREE TIMES DAILY   pantoprazole 40 MG tablet Commonly known as:  PROTONIX Take 1 tablet (40 mg total) by mouth daily. Start taking on:  11/28/2017   zolpidem 10 MG tablet Commonly known as:  AMBIEN Take 1 tablet (10 mg total) by mouth at bedtime as needed.      Review of systems.  In general she is not complaining of any fever or chills.  Skin is not complain of rashes or itching.  Head ears eyes nose mouth and throat does not complain of visual changes- does have a facial droop-- does not complain of sore throat.  Respiratory is not complaining of shortness of breath or cough.  Cardiac is not complaining of chest pain mild lower extremity edema.  GU does not complain of dysuria.  GI does not complain of nausea vomiting diarrhea constipation says she is having fairly regular bowel  movements.  Musculoskeletal has right-sided weakness but is not currently complaining of pain.  Neurologic does continue with right-sided weakness does have some movement of her right leg- does have a facial droop- does not complain of dizziness but does have a intermittent headache  Psych appears to be in relatively good spirits visiting with her husband   Physical exam.  Temperature is 97.4 pulse is 80 respirations 20 blood pressure 185/85-O2 saturation is 95% on room air  General this is a pleasant middle-age female in no distress lying comfortably in bed.  Her skin is warm and dry she does have a well-healed surgical scar left knee.  Eyes visual acuity appears to be intact pupils are equal round reactive to light sclera  and conjunctive are clear-  Oropharynx is clear mucous membranes moist.  Chest is clear to auscultation there is no labored breathing.  Heart is regular rate and rhythm or gallop or rub she has quite mild lower extremity edema pedal pulses are intact bilaterally.  Abdomen is soft nontender somewhat obese with positive bowel sounds  Musculoskeletal is able to move left upper and lower extremities at baseline strength it appears she is able to roll in bed.  Does not have significant movement of her Right upper extremity and has slight movement against gravity right lower extremity  Neurologic as noted above touch sensation is intact all 4 extremities- she does have facial droop-and some slight slurring of her speech   Psych she is alert and oriented very pleasant and appropriate   Labs.  November 27, 2017.  Sodium 138 potassium 3.8 BUN 22 creatinine 1.07 WBC 7.4 hemoglobin 13.8 platelets 235  November 26, 2017.  Cholesterol 218-triglycerides 360-HDL 25- LDL 121  Hemoglobin A1c 9.5    Assessment and plan.  1.  History of CVA with right-sided weakness- this was thought to be an acute ischemic left brain CVA- recommendation to continue aspirin  full-strength for secondary prevention- her LDL was 121 and patient was started on a statin Lipitor 40 mg daily.  Also per discharge summary neurology recommended 30-day event monitoring that apparently will be arranged through the cardiology service   Also recommendation for more strict glycemic control with history of diabetes type 2 with goal hemoglobin A1c less than 7  She will need extensive PT and OT.  2.  Diabetes type 2 as noted above goal hemoglobin A1c is less than 7- glipizide was discontinued she is been started on Levemir she is also on Glucophage- at this point will monitor CBGs notify provider of CBG less than 60 or greater than 300   #3 hypertension-systolic appears to be somewhat elevated appears this was the case in the hospital as well-at this point will monitor- suspect we will not be real aggressive trying to lower this too quickly-she has been restarted on her Lotrel---at this point monitor blood pressures frequently notify provider if systolic greater than 837 diastolic greater than 98  4-- hypothyroidism TSH was found to be elevated in the hospital at 8.579- Synthroid was started with recommendation to repeat thyroid panel in 6 weeks.  5 history of GERD she continue to better.  6 history of depression continues on Lexapro  7.  Insomnia she is on Ambien as needed we will have to monitor to see how this is tolerated apparently she has been on this for some time   Again her blood pressure will need to be monitored and CBC and BMP will need to be updated periodically-appears blood work was fairly stable in the hospital  exceptfor the elevated TSH and elevated hemoglobin A1c--as well as elevations on cholesterol panel    [id number]- of note greater than 40 minutes spent assessing patient-reviewing her chart and labs- and coordinating and formulating a plan of care for numerous diagnoses- of note greater than 50% of time spent coordinating plan of care

## 2017-11-27 NOTE — Clinical Social Work Placement (Signed)
   CLINICAL SOCIAL WORK PLACEMENT  NOTE  Date:  11/27/2017  Patient Details  Name: [name] MRN: [id number] Date of Birth: 07/25/1955  Clinical Social Work is seeking post-discharge placement for this patient at the Skilled  Nursing Facility level of care (*CSW will initial, date and re-position this form in  chart as items are completed):  Yes   Patient/family provided with [place] Clinical Social Work Department's list of facilities offering this level of care within the geographic area requested by the patient (or if unable, by the patient's family).  Yes   Patient/family informed of their freedom to choose among providers that offer the needed level of care, that participate in Medicare, Medicaid or managed care program needed by the patient, have an available bed and are willing to accept the patient.  Yes   Patient/family informed of [facility]'s ownership interest in [facility] and [facility], as well as of the fact that they are under no obligation to receive care at these facilities.  PASRR submitted to EDS on 11/26/17     PASRR number received on 11/26/17     Existing PASRR number confirmed on       FL2 transmitted to all facilities in geographic area requested by pt/family on 11/26/17     FL2 transmitted to all facilities within larger geographic area on       Patient informed that his/her managed care company has contracts with or will negotiate with certain facilities, including the following:        Yes   Patient/family informed of bed offers received.  [facility]     Physician recommends and patient chooses bed at      Patient to be [facility] on 11/27/17.  [facility]     Patient family notified on 11/27/17 of transfer.  Name of family member notified:  spouse, [name]     PHYSICIAN       Additional Comment:  Discharge clinicals sent   [facility]. Facility received authorization. LCSW signing off.   _______________________________________________ Annice NeedySettle, Tarrence Enck D, LCSW 11/27/2017, 2:26 PM

## 2017-11-27 NOTE — Progress Notes (Addendum)
IV removed, WNL. D/C instructions given to pt and family. Verbalized understanding. Pt family member at bedside to travel along [facility].

## 2017-11-27 NOTE — Plan of Care (Signed)
  Problem: Acute Rehab OT Goals (only OT should resolve) Goal: Pt. Will Perform Grooming Flowsheets (Taken 11/27/2017 0905) Pt Will Perform Grooming: with set-up;sitting;with min assist;with mod assist;standing Goal: Pt. Will Perform Upper Body Dressing Flowsheets (Taken 11/27/2017 0905) Pt Will Perform Upper Body Dressing: with set-up;with supervision;sitting Goal: Pt. Will Transfer To Toilet Flowsheets (Taken 11/27/2017 [phone]) Pt Will Transfer to Toilet: with min assist;stand pivot transfer;ambulating;regular height toilet;bedside commode Goal: Pt. Will Perform Toileting-Clothing Manipulation Flowsheets (Taken 11/27/2017 0905) Pt Will Perform Toileting - Clothing Manipulation and hygiene: with min guard assist;sitting/lateral leans;sit to/from stand Goal: Pt/Caregiver Will Perform Home Exercise Program Flowsheets (Taken 11/27/2017 [phone]) Pt/caregiver will Perform Home Exercise Program: Increased ROM;Increased strength;Right Upper extremity;Independently;With minimal assist;With written HEP provided

## 2017-11-27 NOTE — Progress Notes (Signed)
Report called and given to nursing staff at [facility].  

## 2017-11-27 NOTE — Progress Notes (Signed)
Physical Therapy Treatment Patient Details Name: [name] MRN: [id number] DOB: [birth year] Today's Date: 11/27/2017    History of Present Illness [name] is a 62 y.o. female with past medical history significant for hypertension, hyperlipidemia, type 2 diabetes mellitus, tobacco abuse and fibromyalgia; who presented to the emergency department secondary to right-sided weakness.  Patient was last seen normal Friday night; on Saturday she woke up with right weakness and not feeling good. She thought was secondary to her fibromyalgia and didn't seek medical attention. On Sunday morning her symptoms persist and she came to ED for further evaluation and treatment. MRI positive for acute CVA    PT Comments    Patient demonstrates slightly increased strength in RLE for extending knee against gravity while seated at bedside, improved balance when standing with hemi-walker, but continues to require tactile assist to move RLE when taking steps due to weakness.  Patient tolerated sitting up in chair after therapy with spouse present at bedside.  Patient will benefit from continued physical therapy in hospital and recommended venue below to increase strength, balance, endurance for safe ADLs and gait.    Follow Up Recommendations  SNF     Equipment Recommendations  Cane    Recommendations for Other Services       Precautions / Restrictions Precautions Precautions: None Restrictions Weight Bearing Restrictions: No    Mobility  Bed Mobility Overal bed mobility: Needs Assistance Bed Mobility: Supine to Sit     Supine to sit: Mod assist     General bed mobility comments: has difficulty propping up on left elbow due to right sided weakness and falls over to the right  Transfers Overall transfer level: Needs assistance Equipment used: Hemi-walker Transfers: Sit to/from [employer] Transfers Sit to Stand: Mod assist Stand pivot transfers: Mod assist;Max assist        General transfer comment: required tactile cueing to move RLE while standing  Ambulation/Gait Ambulation/Gait assistance: Max assist Gait Distance (Feet): 3 Feet Assistive device: Hemi-walker Gait Pattern/deviations: Decreased step length - right;Decreased stance time - right;Decreased stride length Gait velocity: slow   General Gait Details: limited 6-7 slow labored shuffling steps with RLE requiring assistance to move RLE and to maintain standing balance   Stairs             Wheelchair Mobility    Modified Rankin (Stroke Patients Only)       Balance Overall balance assessment: Needs assistance Sitting-balance support: Feet supported;No upper extremity supported Sitting balance-Leahy Scale: Fair Sitting balance - Comments: fair/good, able to keep trunk centered while perforing BLE exercises    Standing balance support: Single extremity supported;During functional activity Standing balance-Leahy Scale: Poor Standing balance comment: using hemi-walker                            Cognition Arousal/Alertness: Awake/alert Behavior During Therapy: WFL for tasks assessed/performed Overall Cognitive Status: Within Functional Limits for tasks assessed                                        Exercises General Exercises - Lower Extremity Long Arc Quad: Seated;AROM;AAROM;Strengthening;Both;10 reps Heel Slides: Supine;AROM;Strengthening;Right;10 reps Hip Flexion/Marching: Seated;AROM;AAROM;Strengthening;Both;10 reps Other Exercises Other Exercises: PROM end range stretching to right heelcords x 4 sets of 20 secon holds    General Comments        Pertinent  Vitals/Pain Pain Assessment: No/denies pain    Home Living                      Prior Function            PT Goals (current goals can now be found in the care plan section) Acute Rehab PT Goals Patient Stated Goal: return home after rehab PT Goal Formulation: With  patient/family Time For Goal Achievement: 12/12/17 Potential to Achieve Goals: Good Progress towards PT goals: Progressing toward goals    Frequency    7X/week      PT Plan Current plan remains appropriate    Co-evaluation              AM-PAC PT "6 Clicks" Daily Activity  Outcome Measure  Difficulty turning over in bed (including adjusting bedclothes, sheets and blankets)?: A Lot Difficulty moving from lying on back to sitting on the side of the bed? : A Lot Difficulty sitting down on and standing up from a chair with arms (e.g., wheelchair, bedside commode, etc,.)?: A Lot Help needed moving to and from a bed to chair (including a wheelchair)?: A Lot Help needed walking in hospital room?: Total Help needed climbing 3-5 steps with a railing? : Total 6 Click Score: 10    End of Session Equipment Utilized During Treatment: Gait belt Activity Tolerance: Patient tolerated treatment well;Patient limited by fatigue Patient left: in chair;with call bell/phone within reach;with family/visitor present Nurse Communication: Mobility status PT Visit Diagnosis: Unsteadiness on feet (R26.81);Other abnormalities of gait and mobility (R26.89);Muscle weakness (generalized) (M62.81)     Time: [id number] PT Time Calculation (min) (ACUTE ONLY): 28 min  Charges:  $Therapeutic Exercise: 8-22 mins $Therapeutic Activity: 8-22 mins                     2:23 PM, 11/27/17 [name], MPT Physical Therapist with [phone] office [phone] mobile phone

## 2017-11-27 NOTE — Discharge Summary (Signed)
Physician Discharge Summary  [place] WNI:[id number] DOB: [birth year] DOA: 11/25/2017  PCP: Jake Samples, PA-C  Admit date: 11/25/2017 Discharge date: 11/27/2017  Time spent: 35 minutes  Recommendations for Outpatient Follow-up:  1. Repeat basic metabolic panel to follow electrolytes and renal function 2. Reassess blood pressure and further adjust antihypertensive regimen as needed 3. Close follow-up to patient's CBGs/A1c and further adjust hypoglycemic regimen as needed.   Discharge Diagnoses:  Principal Problem:   Stroke-like symptoms Active Problems:   Hypertension   Type 2 diabetes mellitus with hyperglycemia (HCC)   HLD (hyperlipidemia)   Fibromyalgia   Depression   Tobacco abuse   Cerebrovascular accident (CVA) ([place])   Ischemic stroke ([place])   Discharge Condition: Stable and in no acute distress.  Patient will be transfer to a skilled nursing facility for further care and rehabilitation.  Diet recommendation: Heart healthy, modified carbohydrates; dysphagia 3 with thin liquids.  Filed Weights   11/25/17 0634  Weight: 77.1 kg (170 lb)    History of present illness:  62 y.o.femalewith past medical history significant for hypertension, hyperlipidemia,type 2 diabetes mellitus, tobacco abuse and fibromyalgia; who presented to the emergency department secondary to right-sided weakness. Patient was last seen normal Friday night; on Saturday she woke up with right weakness and not feeling good. She thought was secondary to her fibromyalgia and didn't seek medical attention. On Sunday morning her symptoms persist and she came to ED for further evaluation and treatment. On evaluation she is having a dense right side hemiparesis mild facial droop and mild dysarthria. She denies chest pain, shortness of breath, nausea, vomiting, dysuria, melena, hematochezia, hematuria, fever, chills, headaches or any other complaints.   Hospital Course:  1-left brain ischemic  stroke  -stroke has been confirmed MRI -Carotid Dopplers demonstrating just mild plaque buildup -2D echo without source for emboli.;  Grade 1 diastolic dysfunction and preserved ejection fraction. -Continue aspirin for secondary prevention (full dose) -LDL was 121 and the patient will be started on Lipitor 40 mg by mouth daily -A1C 9.5; will adjust hypoglycemic further for better control. Goal is A1C < 7 -following neurology rec's; 30 days event monitoring will be arrange through cardiology service at discharge.  2-hypothyroidism -TSH 8.579 -will start synthroid 50 mcg daily -Free T4 WNL -repeat thyroid panel in 6 weeks  3-GERD -continue PPI  4-right side hemiparesis and mild dysphagia -patient will need SNF for further care and rehabilitation  5-type 2 diabetes, uncontrolled, with hyperglycemia and CVA -A1c 9.5 -Resume metformin at thousand milligrams twice a day -Start patient on Levemir 30 units subcutaneously daily -Follow CBGs and further adjust hypoglycemic regimen as needed -Glipizide has been discontinued. -Modified carbohydrate diet has been advised.  6-HTN -Resume home antihypertensive regimen -Follow vital signs and further adjust antihypertensive medication as needed. -Heart healthy diet advised.  7-tobacco abuse -continue nicotine patch -extensive cessation counsleing provided on 7/28 -Patient is motivated and looking to quit.  8-depression  -No suicidal ideation or hallucinations -Continue Lexapro  9-HLD -start lipitor 51m daily and lovaza 1G BID. -continue fenofibrate.   Procedures:  See below for x-ray reports.   Carotid duplex: -Color duplex indicates minimal heterogeneous and calcified plaque, with no hemodynamically significant stenosis by duplex criteria in the extracranial cerebrovascular circulation.   2D echo: - Left ventricle: The cavity size was normal. Wall thickness was increased in a pattern of moderate LVH. Systolic  function was vigorous. The estimated ejection fraction was in the range of 65% to 70%. Wall motion was  normal; there were no regional wall motion abnormalities. Doppler parameters are consistent with abnormal left ventricular relaxation (grade 1 diastolic dysfunction). - Aortic valve: Mildly calcified annulus. Trileaflet; mildly thickened leaflets. - Mitral valve: Mildly calcified annulus. Normal thickness leaflets. - Systemic veins: IVC is small, suggesting low RA pressure and hypovolemia. - Technically adequate study.  Consultations:  Neurology  Discharge Exam: Vitals:   11/27/17 0622 11/27/17 1020  BP: (!) 167/75 (!) 180/81  Pulse: 88 80  Resp: 16 16  Temp: 98.5 F (36.9 C) 98.4 F (36.9 C)  SpO2: 96% 96%    General exam: Alert, awake, oriented x 3; in no distress with improvement in her dysarthria.  Denies nausea, no vomiting, no chest pain or shortness of breath.  Patient has remained with very little facial droop, mild dysarthria and right hemiparesis. Respiratory system: Clear to auscultation. Respiratory effort normal. Cardiovascular system: RRR. No murmurs, rubs, gallops. Gastrointestinal system: Abdomen is nondistended, soft and nontender. No organomegaly or masses felt. Normal bowel sounds heard. Central nervous system: Alert and oriented X3.  Positive right-sided hemiparesis, mild facial droop.  Left upper and lower extremities without any deficits. Extremities: No cyanosis, clubbing or edema. Skin: No rashes, open wounds, induration or petechiae. Psychiatry: Judgement and insight appear normal. Mood & affect appropriate.      Discharge Instructions   Discharge Instructions    Diet - low sodium heart healthy   Complete by:  As directed    Discharge instructions   Complete by:  As directed    Dysphagia 3 diet with thin liquids Physical therapy, deconditioning and rehabilitation as per the skilled nursing facility protocol Heart  healthy/modified carbohydrates diet. Maintain adequate hydration Repeat basic metabolic panel to follow electrolytes and renal function in 1 week.     Allergies as of 11/27/2017      Reactions   Bee Pollen Anaphylaxis, Swelling   Codeine    REACTION: nauseated,claustrophobic      Medication List    STOP taking these medications   glipiZIDE 10 MG 24 hr tablet Commonly known as:  GLUCOTROL XL   simvastatin 40 MG tablet Commonly known as:  ZOCOR   SOLIQUA 100-33 UNT-MCG/ML Sopn Generic drug:  Insulin Glargine-Lixisenatide     TAKE these medications   amlodipine-benazepril 2.5-10 MG capsule Commonly known as:  LOTREL Take 1 capsule by mouth daily.   aspirin 325 MG tablet Take 1 tablet (325 mg total) by mouth daily. Start taking on:  11/28/2017   atorvastatin 40 MG tablet Commonly known as:  LIPITOR Take 1 tablet (40 mg total) by mouth daily at 6 PM.   diclofenac sodium 1 % Gel Commonly known as:  VOLTAREN Apply 3 g to 3 large joints up to 3 times a day when necessary;  dispense 10 tubes with 1 refill   escitalopram 10 MG tablet Commonly known as:  LEXAPRO Take 10 mg by mouth daily.   fenofibrate 160 MG tablet Take 160 mg by mouth daily.   insulin detemir 100 UNIT/ML injection Commonly known as:  LEVEMIR Inject 0.3 mLs (30 Units total) into the skin daily. Start taking on:  11/28/2017   levothyroxine 50 MCG tablet Commonly known as:  SYNTHROID, LEVOTHROID Take 1 tablet (50 mcg total) by mouth daily before breakfast. Start taking on:  11/28/2017   metFORMIN 1000 MG tablet Commonly known as:  GLUCOPHAGE Take 1 tablet (1,000 mg total) by mouth 2 (two) times daily with a meal. What changed:    medication strength  how  much to take  when to take this   nabumetone 500 MG tablet Commonly known as:  RELAFEN Take 1 tablet by mouth 2 (two) times daily.   nicotine 21 mg/24hr patch Commonly known as:  NICODERM CQ - dosed in mg/24 hours Place 1 patch (21 mg  total) onto the skin daily. Start taking on:  11/28/2017   omega-3 acid ethyl esters 1 g capsule Commonly known as:  LOVAZA Take 1 capsule (1 g total) by mouth 2 (two) times daily.   ONETOUCH VERIO test strip Generic drug:  glucose blood USE 1 STRIP TO CHECK GLUCOSE THREE TIMES DAILY   ONETOUCH VERIO w/Device Kit USE TO CHECK GLUCOSE THREE TIMES DAILY   pantoprazole 40 MG tablet Commonly known as:  PROTONIX Take 1 tablet (40 mg total) by mouth daily. Start taking on:  11/28/2017   zolpidem 10 MG tablet Commonly known as:  AMBIEN Take 1 tablet (10 mg total) by mouth at bedtime as needed.      Allergies  Allergen Reactions  . Bee Pollen Anaphylaxis and Swelling  . Codeine     REACTION: nauseated,claustrophobic    Contact information for follow-up providers    Jake Samples, PA-C. Schedule an appointment as soon as possible for a visit in 2 week(s).   Specialty:  Family Medicine Why:  after discharge from SNF Contact information: [address] [place] [address] [phone]            Contact information for after-discharge care    [facility] .   Service:  Skilled Nursing Contact information: [address] [place] [phone]                  The results of significant diagnostics from this hospitalization (including imaging, microbiology, ancillary and laboratory) are listed below for reference.    Significant Diagnostic Studies: Ct Head Wo Contrast  Result Date: 11/25/2017 CLINICAL DATA:  Right-sided weakness for several hours EXAM: CT HEAD WITHOUT CONTRAST TECHNIQUE: Contiguous axial images were obtained from the base of the skull through the vertex without intravenous contrast. COMPARISON:  06/13/2017 FINDINGS: Brain: Scattered areas of decreased attenuation are noted in the deep white matter on the left. Similar changes are noted in the anterior limb of the  internal capsule on right. These changes are new from the prior exam and likely related to chronic white matter ischemic change. No definitive acute ischemia is seen. No hemorrhage or space-occupying mass lesion is noted. Vascular: No hyperdense vessel or unexpected calcification. Skull: Normal. Negative for fracture or focal lesion. Sinuses/Orbits: No acute finding. Other: None. IMPRESSION: Likely subacute to chronic ischemia particularly on the left in the deep white matter. Given the patient's clinical history. MRI is recommended for further evaluation. Electronically Signed   By: Inez Catalina M.D.   On: 11/25/2017 07:33   Mr Brain Wo Contrast  Result Date: 11/26/2017 CLINICAL DATA:  Right-sided weakness for 2 days. EXAM: MRI HEAD WITHOUT CONTRAST TECHNIQUE: Multiplanar, multiecho pulse sequences of the brain and surrounding structures were obtained without intravenous contrast. COMPARISON:  Head CT 11/25/2017 FINDINGS: Brain: There is an acute infarct involving the posterior left lentiform nucleus and corona radiata. An adjacent infarct immediately anterior to this has a subacute appearance. No intracranial hemorrhage, mass, midline shift, or extra-axial fluid collection is identified. The ventricles and sulci are within normal limits for age. Small foci of T2 hyperintensity scattered throughout the subcortical and deep  cerebral white matter and pons are nonspecific but compatible with mild chronic small vessel ischemic disease. Chronic lacunar infarcts are noted in the right basal ganglia, right internal capsule, and thalami. Vascular: Major intracranial vascular flow voids are preserved. Skull and upper cervical spine: No suspicious marrow lesion. Sinuses/Orbits: Unremarkable orbits. Paranasal sinuses and mastoid air cells are clear. Other: None. IMPRESSION: 1. Acute and subacute left lateral lenticulostriate territory infarcts. 2. Chronic small vessel ischemia with multiple chronic deep gray nuclei and  white matter infarcts. Electronically Signed   By: Logan Bores M.D.   On: 11/26/2017 10:05   US Carotid Bilateral (at [facility] Only)  Result Date: 11/26/2017 CLINICAL DATA:  62 year old female with a history of right-sided weakness, stroke. Cardiovascular risk factors include hypertension, known prior stroke/TIA, hyperlipidemia, tobacco use, diabetes EXAM: BILATERAL CAROTID DUPLEX ULTRASOUND TECHNIQUE: Pearline Cables scale imaging, color Doppler and duplex ultrasound were performed of bilateral carotid and vertebral arteries in the neck. COMPARISON:  No prior duplex FINDINGS: Criteria: Quantification of carotid stenosis is based on velocity parameters that correlate the residual internal carotid diameter with NASCET-based stenosis levels, using the diameter of the distal internal carotid lumen as the denominator for stenosis measurement. The following velocity measurements were obtained: RIGHT ICA:  Systolic 409 cm/sec, Diastolic 22 cm/sec CCA:  811 cm/sec SYSTOLIC ICA/CCA RATIO:  0.9 ECA:  174 cm/sec LEFT ICA:  Systolic 914 cm/sec, Diastolic 26 cm/sec CCA:  782 cm/sec SYSTOLIC ICA/CCA RATIO:  0.7 ECA:  123 cm/sec Right Brachial SBP: Not acquired Left Brachial SBP: Not acquired RIGHT CAROTID ARTERY: No significant calcifications of the right common carotid artery. Intermediate waveform maintained. Heterogeneous and partially calcified plaque at the right carotid bifurcation. No significant lumen shadowing. Low resistance waveform of the right ICA. No significant tortuosity. RIGHT VERTEBRAL ARTERY: Antegrade flow with low resistance waveform. LEFT CAROTID ARTERY: No significant calcifications of the left common carotid artery. Intermediate waveform maintained. Heterogeneous and partially calcified plaque at the left carotid bifurcation without significant lumen shadowing. Low resistance waveform of the left ICA. No significant tortuosity. LEFT VERTEBRAL ARTERY:  Antegrade flow with low resistance waveform. IMPRESSION:  Color duplex indicates minimal heterogeneous and calcified plaque, with no hemodynamically significant stenosis by duplex criteria in the extracranial cerebrovascular circulation. Signed, Dulcy Fanny. Dellia Nims, RPVI [facility] Electronically Signed   By: Corrie Mckusick D.O.   On: 11/26/2017 09:44   Mr Jodene Nam Head/brain NF Cm  Result Date: 11/26/2017 CLINICAL DATA:  Right-sided weakness for 2 days. EXAM: MRA HEAD WITHOUT CONTRAST TECHNIQUE: Angiographic images of the Circle of Willis were obtained using MRA technique without intravenous contrast. COMPARISON:  None. FINDINGS: The intracranial vertebral arteries are patent to the basilar and codominant. The left PICA and right AICA appear dominant. Patent SCA origins are visualized bilaterally. The basilar artery is widely patent. There is a large right posterior communicating artery. The PCAs are patent with multifocal severe P2 stenoses bilaterally. The internal carotid arteries are patent from skull base to carotid termini with mild left ICA stenosis near the petrous-cavernous junction. ACAs and MCAs are patent without evidence of significant A1 or M1 stenosis or proximal branch occlusion. There is a severe mid left M2 branch vessel stenosis, and there is a moderate distal left A2 stenosis. No aneurysm is identified. IMPRESSION: 1. No large vessel occlusion. 2. Intracranial atherosclerosis as above including severe bilateral P2 stenoses. Electronically Signed   By: Logan Bores M.D.   On: 11/26/2017 09:55   Labs:  Basic Metabolic Panel: Recent Labs  Lab 11/25/17 0651 11/26/17 0631 11/27/17 0601  NA 137 140 138  K 3.7 3.7 3.8  CL 105 110 107  CO2 21* 24 21*  GLUCOSE 308* 240* 288*  BUN _0 CREATININE 0.87 0.89 1.07*  CALCIUM 9.6 9.2 9.1   Liver Function Tests: Recent Labs  Lab 11/25/17 0651  AST 35  ALT 34  ALKPHOS 87  BILITOT 0.6  PROT 7.2  ALBUMIN 3.4*   CBC: Recent Labs   Lab 11/25/17 0651 11/26/17 0631 11/27/17 0601  WBC 6.5 7.4 7.5  NEUTROABS 3.3  --   --   HGB 14.0 13.8 14.0  HCT 41.3 40.5 40.9  MCV 91.4 91.4 91.5  PLT 202 235 229    CBG: Recent Labs  Lab 11/26/17 2124 11/26/17 2339 11/27/17 0624 11/27/17 0714 11/27/17 1113  GLUCAP 199* 239* 270* 243* 245*    Signed:  Barton Dubois MD.  [facility] 11/27/2017, 1:23 PM

## 2017-11-28 ENCOUNTER — Other Ambulatory Visit: Payer: Self-pay

## 2017-11-28 ENCOUNTER — Non-Acute Institutional Stay (SKILLED_NURSING_FACILITY): Payer: Medicare HMO | Admitting: Internal Medicine

## 2017-11-28 ENCOUNTER — Encounter: Payer: Self-pay | Admitting: Internal Medicine

## 2017-11-28 DIAGNOSIS — I639 Cerebral infarction, unspecified: Secondary | ICD-10-CM | POA: Diagnosis not present

## 2017-11-28 DIAGNOSIS — Z794 Long term (current) use of insulin: Secondary | ICD-10-CM

## 2017-11-28 DIAGNOSIS — E1165 Type 2 diabetes mellitus with hyperglycemia: Secondary | ICD-10-CM

## 2017-11-28 DIAGNOSIS — I10 Essential (primary) hypertension: Secondary | ICD-10-CM | POA: Diagnosis not present

## 2017-11-28 MED ORDER — ZOLPIDEM TARTRATE 10 MG PO TABS: 10.0000 mg | ORAL_TABLET | Freq: Every evening | ORAL | 0 refills | Status: DC | PRN

## 2017-11-28 NOTE — Progress Notes (Signed)
Location:   [place] Room Number: 158/P Place of Service:  SNF [phone]) Provider:  Loma Sousa, PA-C  Patient Care Team: Scherrie Bateman as PCP - General [facility] Medicine)  Extended Emergency Contact Information Primary Emergency Contact: [facility] Address: [address]          [place],  [address] Johnnette Litter of [place] Phone: [phone] Mobile Phone: [phone] Relation: Spouse Secondary Emergency Contact: Suzanna Obey [place] of [place] Phone: [phone] Mobile Phone: [phone] Relation: Sister  Code Status:  Full Code Goals of care: Advanced Directive information Advanced Directives 11/28/2017  Does Patient Have a Medical Advance Directive? Yes  Type of Advance Directive (No Data)  Does patient want to make changes to medical advance directive? No - Patient declined  Would patient like information on creating a medical advance directive? No - Patient declined     Acute visit follow-up CVA- hypertension   HPI:  Pt is a 62 y.o. female seen today for an acute visit for follow-up of CVA as well as hypertension.  Patient is a pleasant 62 year old female who was recently hospitalized for an acute CVA with resulting right-sided weakness. She has a past history of hypertension as well as hyperlipidemia type 2 diabetes tobacco abuse and fibromyalgia.  She presented to the ER with right-sided weakness and was found to have a left brain ischemic stroke- 2D echo was negative for any source of emboli- carotid Doppler showed mild plaque buildup-  Recommendation was aspirin for secondary prevention she is also now on a statin Lipitor. She is also  She also has a history of hypertension- and was restarted on her home Lotrel on discharge  It appears her blood pressure was somewhat elevated in the hospital I see the recent ones ranging from proximally 841- 660 systolically --since she has  had a stroke I suspect there is consideration for conservative control blood pressure  control to gradually lower her blood pressures.  Blood pressure in the facility today appears to be systolically in the 630Z- apparently pharmacies process of delivering the Lotrel but facility has not received it yet. I did take blood pressure manually and got 182/80.  She was sitting up for short time today but did complain of being dizzy and was put back to bed and said she felt better.  Apparently she had limited time up in the chair when she was in the hospital       Past Medical History:  Diagnosis Date  . Diabetes mellitus without complication ([place])   . Diverticulitis   . Fibromyalgia   . Hyperlipidemia   . Hypertension   . Papule 03/22/2015   Past Surgical History:  Procedure Laterality Date  . CHOLECYSTECTOMY    . KNEE SURGERY Left     Allergies  Allergen Reactions  . Bee Pollen Anaphylaxis and Swelling  . Bee Venom   . Codeine     REACTION: nauseated,claustrophobic    Outpatient Encounter Medications as of 11/28/2017  Medication Sig  . amlodipine-benazepril (LOTREL) 2.5-10 MG capsule Take 1 capsule by mouth daily.   Marland Kitchen aspirin 325 MG tablet Take 1 tablet (325 mg total) by mouth daily.  Marland Kitchen atorvastatin (LIPITOR) 40 MG tablet Take 1 tablet (40 mg total) by mouth daily at 6 PM.  . Blood Glucose Monitoring Suppl (ONETOUCH VERIO) w/Device KIT USE TO CHECK GLUCOSE THREE TIMES DAILY  . diclofenac sodium (VOLTAREN) 1 % GEL Apply 3 g to 3 large  joints up to 3 times a day when necessary;  dispense 10 tubes with 1 refill  . escitalopram (LEXAPRO) 10 MG tablet Take 10 mg by mouth daily.   . fenofibrate 160 MG tablet Take 160 mg by mouth daily.   . insulin detemir (LEVEMIR) 100 UNIT/ML injection Inject 0.3 mLs (30 Units total) into the skin daily.  Marland Kitchen levothyroxine (SYNTHROID, LEVOTHROID) 50 MCG tablet Take 1 tablet (50 mcg total) by mouth daily before breakfast.  . metFORMIN (GLUCOPHAGE)  1000 MG tablet Take 1 tablet (1,000 mg total) by mouth 2 (two) times daily with a meal.  . nabumetone (RELAFEN) 500 MG tablet Take 1 tablet by mouth 2 (two) times daily.  . nicotine (NICODERM CQ - DOSED IN MG/24 HOURS) 21 mg/24hr patch Place 1 patch (21 mg total) onto the skin daily.  Marland Kitchen omega-3 acid ethyl esters (LOVAZA) 1 g capsule Take 1 capsule (1 g total) by mouth 2 (two) times daily.  Glory Rosebush VERIO test strip USE 1 STRIP TO CHECK GLUCOSE THREE TIMES DAILY  . pantoprazole (PROTONIX) 40 MG tablet Take 1 tablet (40 mg total) by mouth daily.  Marland Kitchen zolpidem (AMBIEN) 10 MG tablet Take 1 tablet (10 mg total) by mouth at bedtime as needed.   No facility-administered encounter medications on file as of 11/28/2017.     Review of Systems   She is not complaining of any fever chills says she feels somewhat weak has had this feeling since her CVA.  Skin does not complain of diaphoresis rashes or itching  Head ears eyes nose mouth and throat is not complain of visual changes or sore throat or difficulty swallowing.  Respiratory is not complaining of shortness of breath or cough.   Cardiac is not complaining of chest pain  Muscle skeletal says she feels weak but does not really complain of pain   Neurologic does not complain of numbness does complain of some weakness which is baseline after sitting up for short time she was feeling dizzy but says she felt better when she was put back to bed   Psych does not complain of being anxious or depressed     Immunization History  Administered Date(s) Administered  . Influenza-Unspecified 01/20/2015  . Zoster 02/19/2015   Pertinent  Health Maintenance Due  Topic Date Due  . FOOT EXAM  12/29/2017 (Originally 09/06/1965)  . MAMMOGRAM  12/29/2017 (Originally 08/15/2017)  . PAP SMEAR  12/29/2017 (Originally 03/19/2017)  . OPHTHALMOLOGY EXAM  12/29/2017 (Originally 09/06/1965)  . URINE MICROALBUMIN  12/29/2017 (Originally 09/06/1965)  . COLONOSCOPY   12/29/2017 (Originally 09/06/2004)  . INFLUENZA VACCINE  11/29/2017  . HEMOGLOBIN A1C  05/29/2018   No flowsheet data found. Functional Status Survey:    Vitals:   11/28/17 1506  BP: (!) 184/94  Pulse: 86  Resp: 20  Temp: 98.2 F (36.8 C)  TempSrc: Oral  Weight: 173 lb 8 oz (78.7 kg)  [place] blood pressure was 182/80 Body mass index is 30.73 kg/m. Physical Exam  General this is a pleasant middle-age female sitting comfortably in her wheelchair.  Her skin is warm and dry.  Eyes visual acuity appears to be intact sclera conjunctive are clear pulse are equal round reactive to light  Oropharynx is clear tongue is midline with appears full range of motion mucous membranes are moist   Chest is clear to auscultation there is no labored breathing  Heart is regular rate and rhythm I got 90s on auscultation she has quite mild lower extremity edema.  Abdomen is soft nontender with positive bowel sounds.  Musculoskeletal is able to move her left upper and lower extremities appears at baseline grip strength appears to be intact on the left- she continues with right upper arm hemiparalysis has slight movement of her right leg  Neurologic as noted above her speech is somewhat slurred she does have a facial droop is baseline with exam yesterday  Psych she is alert and oriented very pleasant and appropriate   Labs reviewed: Recent Labs    11/25/17 0651 11/26/17 0631 11/27/17 0601  NA 137 140 138  K 3.7 3.7 3.8  CL 105 110 107  CO2 21* 24 21*  GLUCOSE 308* 240* 288*  BUN _0 CREATININE 0.87 0.89 1.07*  CALCIUM 9.6 9.2 9.1   Recent Labs    11/25/17 0651  AST 35  ALT 34  ALKPHOS 87  BILITOT 0.6  PROT 7.2  ALBUMIN 3.4*   Recent Labs    11/25/17 0651 11/26/17 0631 11/27/17 0601  WBC 6.5 7.4 7.5  NEUTROABS 3.3  --   --   HGB 14.0 13.8 14.0  HCT 41.3 40.5 40.9  MCV 91.4 91.4 91.5  PLT 202 235 229   Lab Results  Component Value Date   TSH 8.579 (H)  11/25/2017   Lab Results  Component Value Date   HGBA1C 9.5 (H) 11/26/2017   Lab Results  Component Value Date   CHOL 218 (H) 11/26/2017   HDL 25 (L) 11/26/2017   LDLCALC 121 (H) 11/26/2017   TRIG 360 (H) 11/26/2017   CHOLHDL 8.7 11/26/2017    Significant Diagnostic Results in last 30 days:  Ct Head Wo Contrast  Result Date: 11/25/2017 CLINICAL DATA:  Right-sided weakness for several hours EXAM: CT HEAD WITHOUT CONTRAST TECHNIQUE: Contiguous axial images were obtained from the base of the skull through the vertex without intravenous contrast. COMPARISON:  06/13/2017 FINDINGS: Brain: Scattered areas of decreased attenuation are noted in the deep white matter on the left. Similar changes are noted in the anterior limb of the internal capsule on right. These changes are new from the prior exam and likely related to chronic white matter ischemic change. No definitive acute ischemia is seen. No hemorrhage or space-occupying mass lesion is noted. Vascular: No hyperdense vessel or unexpected calcification. Skull: Normal. Negative for fracture or focal lesion. Sinuses/Orbits: No acute finding. Other: None. IMPRESSION: Likely subacute to chronic ischemia particularly on the left in the deep white matter. Given the patient's clinical history. MRI is recommended for further evaluation. Electronically Signed   By: Inez Catalina M.D.   On: 11/25/2017 07:33   Mr Brain Wo Contrast  Result Date: 11/26/2017 CLINICAL DATA:  Right-sided weakness for 2 days. EXAM: MRI HEAD WITHOUT CONTRAST TECHNIQUE: Multiplanar, multiecho pulse sequences of the brain and surrounding structures were obtained without intravenous contrast. COMPARISON:  Head CT 11/25/2017 FINDINGS: Brain: There is an acute infarct involving the posterior left lentiform nucleus and corona radiata. An adjacent infarct immediately anterior to this has a subacute appearance. No intracranial hemorrhage, mass, midline shift, or extra-axial fluid collection  is identified. The ventricles and sulci are within normal limits for age. Small foci of T2 hyperintensity scattered throughout the subcortical and deep cerebral white matter and pons are nonspecific but compatible with mild chronic small vessel ischemic disease. Chronic lacunar infarcts are noted in the right basal ganglia, right internal capsule, and thalami. Vascular: Major intracranial vascular flow voids are preserved. Skull and upper cervical spine: No suspicious  marrow lesion. Sinuses/Orbits: Unremarkable orbits. Paranasal sinuses and mastoid air cells are clear. Other: None. IMPRESSION: 1. Acute and subacute left lateral lenticulostriate territory infarcts. 2. Chronic small vessel ischemia with multiple chronic deep gray nuclei and white matter infarcts. Electronically Signed   By: Logan Bores M.D.   On: 11/26/2017 10:05   US Carotid Bilateral (at [facility] Only)  Result Date: 11/26/2017 CLINICAL DATA:  62 year old female with a history of right-sided weakness, stroke. Cardiovascular risk factors include hypertension, known prior stroke/TIA, hyperlipidemia, tobacco use, diabetes EXAM: BILATERAL CAROTID DUPLEX ULTRASOUND TECHNIQUE: Pearline Cables scale imaging, color Doppler and duplex ultrasound were performed of bilateral carotid and vertebral arteries in the neck. COMPARISON:  No prior duplex FINDINGS: Criteria: Quantification of carotid stenosis is based on velocity parameters that correlate the residual internal carotid diameter with NASCET-based stenosis levels, using the diameter of the distal internal carotid lumen as the denominator for stenosis measurement. The following velocity measurements were obtained: RIGHT ICA:  Systolic 678 cm/sec, Diastolic 22 cm/sec CCA:  938 cm/sec SYSTOLIC ICA/CCA RATIO:  0.9 ECA:  174 cm/sec LEFT ICA:  Systolic 101 cm/sec, Diastolic 26 cm/sec CCA:  751 cm/sec SYSTOLIC ICA/CCA RATIO:  0.7 ECA:  123 cm/sec Right Brachial SBP: Not acquired Left Brachial SBP: Not acquired RIGHT  CAROTID ARTERY: No significant calcifications of the right common carotid artery. Intermediate waveform maintained. Heterogeneous and partially calcified plaque at the right carotid bifurcation. No significant lumen shadowing. Low resistance waveform of the right ICA. No significant tortuosity. RIGHT VERTEBRAL ARTERY: Antegrade flow with low resistance waveform. LEFT CAROTID ARTERY: No significant calcifications of the left common carotid artery. Intermediate waveform maintained. Heterogeneous and partially calcified plaque at the left carotid bifurcation without significant lumen shadowing. Low resistance waveform of the left ICA. No significant tortuosity. LEFT VERTEBRAL ARTERY:  Antegrade flow with low resistance waveform. IMPRESSION: Color duplex indicates minimal heterogeneous and calcified plaque, with no hemodynamically significant stenosis by duplex criteria in the extracranial cerebrovascular circulation. Signed, Dulcy Fanny. Dellia Nims, RPVI [facility] Electronically Signed   By: Corrie Mckusick D.O.   On: 11/26/2017 09:44   Mr Jodene Nam Head/brain WC Cm  Result Date: 11/26/2017 CLINICAL DATA:  Right-sided weakness for 2 days. EXAM: MRA HEAD WITHOUT CONTRAST TECHNIQUE: Angiographic images of the Circle of Willis were obtained using MRA technique without intravenous contrast. COMPARISON:  None. FINDINGS: The intracranial vertebral arteries are patent to the basilar and codominant. The left PICA and right AICA appear dominant. Patent SCA origins are visualized bilaterally. The basilar artery is widely patent. There is a large right posterior communicating artery. The PCAs are patent with multifocal severe P2 stenoses bilaterally. The internal carotid arteries are patent from skull base to carotid termini with mild left ICA stenosis near the petrous-cavernous junction. ACAs and MCAs are patent without evidence of significant A1 or M1 stenosis or proximal  branch occlusion. There is a severe mid left M2 branch vessel stenosis, and there is a moderate distal left A2 stenosis. No aneurysm is identified. IMPRESSION: 1. No large vessel occlusion. 2. Intracranial atherosclerosis as above including severe bilateral P2 stenoses. Electronically Signed   By: Logan Bores M.D.   On: 11/26/2017 09:55    Assessment/Plan   #1 history of CVA with right-sided weakness- is baseline today she continues with significant right-sided deficits and slurred speech which is not new- neurologically appears to be at baseline.  She does have continueselevated blood pressure I did speak with staff and they  will be given in her blood pressure medication- they do have benazepril as well as Norvasc in stock and will give her this to compensate for not receiving the Lotrel   #2 type 2 diabetes she is on Levemir as well as Glucophage- glipizide was discontinued in the hospital.  So far blood sugars appear to be mainly in the 200s- at this point since we have limited readings would be hesitant to alter medications  Addendum.  I did check patient later this afternoon and she appeared to be feeling better she was lying in bed no longer complained of dizziness vital signs appear to be stable-blood pressure is slowly coming down was 173/85- pulse is in the 80s to 90s-O2 saturation is satisfactory in the 90s on room air she is afebrile Neurologically appears to be unchanged compared to previous exam as noted above- she says she is feeling better again will continue to monitor- once we get more readings consider increasing her blood pressure medication but again we have minimal readings so far-  Will update a CBC and metabolic panel tomorrow for updated values.  [id number] note greater than 35 minutes spent assessing patient-reassessing patient- discussing her status with nursing staff as well as with her family in the room- reviewing her chart and labs coordinating and formulating  plan of care-

## 2017-11-28 NOTE — Progress Notes (Signed)
Location:   [place] Room Number: 158/P Place of Service:  SNF [phone]) Provider:  Loma Sousa, PA-C  Patient Care Team: Scherrie Bateman as PCP - General [facility] Medicine)  Extended Emergency Contact Information Primary Emergency Contact: [facility] Address: [address]          [place], [place] [address] Johnnette Litter of [place] Phone: [phone] Mobile Phone: [phone] Relation: Spouse Secondary Emergency Contact: Suzanna Obey [place] of [place] Phone: [phone] Mobile Phone: [phone] Relation: Sister  Code Status:  Full Code Goals of care: Advanced Directive information Advanced Directives 11/28/2017  Does Patient Have a Medical Advance Directive? Yes  Type of Advance Directive (No Data)  Does patient want to make changes to medical advance directive? No - Patient declined  Would patient like information on creating a medical advance directive? No - Patient declined     Chief Complaint  Patient presents with  . Hospitalization Follow-up    Patient is being seen for Hospitalization F/U    HPI:  Pt is a 62 y.o. female seen today for a hospital f/u s/p admission from  Past Medical History:  Diagnosis Date  . Diabetes mellitus without complication ([place])   . Diverticulitis   . Fibromyalgia   . Hyperlipidemia   . Hypertension   . Papule 03/22/2015   Past Surgical History:  Procedure Laterality Date  . CHOLECYSTECTOMY    . KNEE SURGERY Left     Allergies  Allergen Reactions  . Bee Pollen Anaphylaxis and Swelling  . Bee Venom   . Codeine     REACTION: nauseated,claustrophobic    Outpatient Encounter Medications as of 11/28/2017  Medication Sig  . amlodipine-benazepril (LOTREL) 2.5-10 MG capsule Take 1 capsule by mouth daily.   Marland Kitchen aspirin 325 MG tablet Take 1 tablet (325 mg total) by mouth daily.  Marland Kitchen atorvastatin (LIPITOR) 40 MG tablet Take 1 tablet (40 mg total) by  mouth daily at 6 PM.  . Blood Glucose Monitoring Suppl (ONETOUCH VERIO) w/Device KIT USE TO CHECK GLUCOSE THREE TIMES DAILY  . diclofenac sodium (VOLTAREN) 1 % GEL Apply 3 g to 3 large joints up to 3 times a day when necessary;  dispense 10 tubes with 1 refill  . escitalopram (LEXAPRO) 10 MG tablet Take 10 mg by mouth daily.   . fenofibrate 160 MG tablet Take 160 mg by mouth daily.   . insulin detemir (LEVEMIR) 100 UNIT/ML injection Inject 0.3 mLs (30 Units total) into the skin daily.  Marland Kitchen levothyroxine (SYNTHROID, LEVOTHROID) 50 MCG tablet Take 1 tablet (50 mcg total) by mouth daily before breakfast.  . metFORMIN (GLUCOPHAGE) 1000 MG tablet Take 1 tablet (1,000 mg total) by mouth 2 (two) times daily with a meal.  . nabumetone (RELAFEN) 500 MG tablet Take 1 tablet by mouth 2 (two) times daily.  . nicotine (NICODERM CQ - DOSED IN MG/24 HOURS) 21 mg/24hr patch Place 1 patch (21 mg total) onto the skin daily.  Marland Kitchen omega-3 acid ethyl esters (LOVAZA) 1 g capsule Take 1 capsule (1 g total) by mouth 2 (two) times daily.  Glory Rosebush VERIO test strip USE 1 STRIP TO CHECK GLUCOSE THREE TIMES DAILY  . pantoprazole (PROTONIX) 40 MG tablet Take 1 tablet (40 mg total) by mouth daily.  Marland Kitchen zolpidem (AMBIEN) 10 MG tablet Take 1 tablet (10 mg total) by mouth at bedtime as needed.   No facility-administered encounter medications on file as of 11/28/2017.  Review of Systems  Immunization History  Administered Date(s) Administered  . Influenza-Unspecified 01/20/2015  . Zoster 02/19/2015   Pertinent  Health Maintenance Due  Topic Date Due  . FOOT EXAM  12/29/2017 (Originally 09/06/1965)  . MAMMOGRAM  12/29/2017 (Originally 08/15/2017)  . PAP SMEAR  12/29/2017 (Originally 03/19/2017)  . OPHTHALMOLOGY EXAM  12/29/2017 (Originally 09/06/1965)  . COLONOSCOPY  12/29/2017 (Originally 09/06/2004)  . INFLUENZA VACCINE  11/29/2017  . HEMOGLOBIN A1C  05/29/2018   No flowsheet data found. Functional Status Survey:     There were no vitals filed for this visit. There is no height or weight on file to calculate BMI. Physical Exam  Labs reviewed: Recent Labs    11/25/17 0651 11/26/17 0631 11/27/17 0601  NA 137 140 138  K 3.7 3.7 3.8  CL 105 110 107  CO2 21* 24 21*  GLUCOSE 308* 240* 288*  BUN _0 CREATININE 0.87 0.89 1.07*  CALCIUM 9.6 9.2 9.1   Recent Labs    11/25/17 0651  AST 35  ALT 34  ALKPHOS 87  BILITOT 0.6  PROT 7.2  ALBUMIN 3.4*   Recent Labs    11/25/17 0651 11/26/17 0631 11/27/17 0601  WBC 6.5 7.4 7.5  NEUTROABS 3.3  --   --   HGB 14.0 13.8 14.0  HCT 41.3 40.5 40.9  MCV 91.4 91.4 91.5  PLT 202 235 229   Lab Results  Component Value Date   TSH 8.579 (H) 11/25/2017   Lab Results  Component Value Date   HGBA1C 9.5 (H) 11/26/2017   Lab Results  Component Value Date   CHOL 218 (H) 11/26/2017   HDL 25 (L) 11/26/2017   LDLCALC 121 (H) 11/26/2017   TRIG 360 (H) 11/26/2017   CHOLHDL 8.7 11/26/2017    Significant Diagnostic Results in last 30 days:  No results found.  Assessment/Plan There are no diagnoses linked to this encounter.   Family/ staff Communication:   Labs/tests ordered:       This encounter was created in error - please disregard.

## 2017-11-28 NOTE — Telephone Encounter (Signed)
RX Fax for [facility]@ [phone]  

## 2017-11-29 ENCOUNTER — Encounter: Payer: Self-pay | Admitting: Internal Medicine

## 2017-11-29 ENCOUNTER — Non-Acute Institutional Stay (SKILLED_NURSING_FACILITY): Payer: Medicare HMO | Admitting: Internal Medicine

## 2017-11-29 ENCOUNTER — Encounter (HOSPITAL_COMMUNITY)
Admission: RE | Admit: 2017-11-29 | Discharge: 2017-11-29 | Disposition: A | Discharge status: Home / Self Care | Payer: Medicare HMO | Source: Skilled Nursing Facility | Attending: Internal Medicine | Admitting: Internal Medicine

## 2017-11-29 DIAGNOSIS — E1165 Type 2 diabetes mellitus with hyperglycemia: Secondary | ICD-10-CM | POA: Diagnosis not present

## 2017-11-29 DIAGNOSIS — N289 Disorder of kidney and ureter, unspecified: Secondary | ICD-10-CM

## 2017-11-29 DIAGNOSIS — E785 Hyperlipidemia, unspecified: Secondary | ICD-10-CM

## 2017-11-29 DIAGNOSIS — I69321 Dysphasia following cerebral infarction: Secondary | ICD-10-CM | POA: Insufficient documentation

## 2017-11-29 DIAGNOSIS — I639 Cerebral infarction, unspecified: Secondary | ICD-10-CM | POA: Insufficient documentation

## 2017-11-29 DIAGNOSIS — M797 Fibromyalgia: Secondary | ICD-10-CM | POA: Diagnosis not present

## 2017-11-29 DIAGNOSIS — Z720 Tobacco use: Secondary | ICD-10-CM | POA: Insufficient documentation

## 2017-11-29 DIAGNOSIS — F329 Major depressive disorder, single episode, unspecified: Secondary | ICD-10-CM

## 2017-11-29 DIAGNOSIS — E039 Hypothyroidism, unspecified: Secondary | ICD-10-CM | POA: Diagnosis not present

## 2017-11-29 DIAGNOSIS — I10 Essential (primary) hypertension: Secondary | ICD-10-CM | POA: Diagnosis not present

## 2017-11-29 DIAGNOSIS — F32A Depression, unspecified: Secondary | ICD-10-CM

## 2017-11-29 DIAGNOSIS — R69 Illness, unspecified: Secondary | ICD-10-CM | POA: Diagnosis not present

## 2017-11-29 DIAGNOSIS — I69351 Hemiplegia and hemiparesis following cerebral infarction affecting right dominant side: Secondary | ICD-10-CM | POA: Insufficient documentation

## 2017-11-29 DIAGNOSIS — Z5189 Encounter for other specified aftercare: Secondary | ICD-10-CM | POA: Insufficient documentation

## 2017-11-29 LAB — BASIC METABOLIC PANEL
Anion gap: 10 (ref 5–15)
BUN: 38 mg/dL — AB (ref 8–23)
CO2: 23 mmol/L (ref 22–32)
Calcium: 9 mg/dL (ref 8.9–10.3)
Chloride: 106 mmol/L (ref 98–111)
Creatinine, Ser: 1.3 mg/dL — ABNORMAL HIGH (ref 0.44–1.00)
GFR calc Af Amer: 50 mL/min — ABNORMAL LOW (ref >60)
GFR calc non Af Amer: 43 mL/min — ABNORMAL LOW (ref >60)
GLUCOSE: 195 mg/dL — AB (ref 70–99)
POTASSIUM: 3.9 mmol/L (ref 3.5–5.1)
Sodium: 139 mmol/L (ref 135–145)

## 2017-11-29 LAB — CBC WITH DIFFERENTIAL/PLATELET
Basophils Absolute: 0 K/uL (ref 0.0–0.1)
Basophils Relative: 0 %
Eosinophils Absolute: 0.1 K/uL (ref 0.0–0.7)
Eosinophils Relative: 2 %
HCT: 39.9 % (ref 36.0–46.0)
Hemoglobin: 13.6 g/dL (ref 12.0–15.0)
Lymphocytes Relative: 46 %
Lymphs Abs: 2.7 K/uL (ref 0.7–4.0)
MCH: 31.4 pg (ref 26.0–34.0)
MCHC: 34.1 g/dL (ref 30.0–36.0)
MCV: 92.1 fL (ref 78.0–100.0)
Monocytes Absolute: 0.6 K/uL (ref 0.1–1.0)
Monocytes Relative: 10 %
Neutro Abs: 2.4 K/uL (ref 1.7–7.7)
Neutrophils Relative %: 42 %
Platelets: 232 K/uL (ref 150–400)
RBC: 4.33 MIL/uL (ref 3.87–5.11)
RDW: 14.2 % (ref 11.5–15.5)
WBC: 5.8 K/uL (ref 4.0–10.5)

## 2017-11-29 NOTE — Progress Notes (Signed)
Provider: Veleta Miners MD  Location:   [facility] Room Number: 158/P Place of Service:  SNF (31)  PCP: Jake Samples, PA-C Patient Care Team: Scherrie Bateman as PCP - General [facility])  Extended Emergency Contact Information Primary Emergency Contact: [facility] Address: [address]          [place], [place] [address] Johnnette Litter of [place] Phone: [phone] Mobile Phone: [phone] Relation: Spouse Secondary Emergency Contact: Suzanna Obey [place] of [place] Phone: [phone] Mobile Phone: [phone] Relation: Sister  Code Status: Full Code Goals of Care: Advanced Directive information Advanced Directives 11/29/2017  Does Patient Have a Medical Advance Directive? Yes  Type of Advance Directive (No Data)  Does patient want to make changes to medical advance directive? No - Patient declined  Would patient like information on creating a medical advance directive? No - Patient declined      Chief Complaint  Patient presents with  . New Admit To SNF    Patient is being seen for New Admission Visit    HPI: Patient is a 62 y.o. female seen today for admission to SNF for therapy. She stayed in the hospital from 07/28-0730 For Acute Left MCA Stroke. Patient has a history of hypertension, hyperlipidemia, type 2 diabetes and fibromyalgia. She was seen in the emergency room with 2-3 days of right-sided weakness.  She fell at home and then came to the ED for further evaluation.  She was found to have a dense right-sided hemiparesis, with facial droop and dysarthria.  She was not a candidate for TPA due to delayed presentation. Her MRI showed acute and subacute left lateral lenticulostriate  Infarcts. Her carotid Dopplers showed mild plaque.  2D echo was negative.  She was started on aspirin, Lipitor for secondary prevention. She was also started on insulin as her A1c was 9.5 Her TSH was high and was  started she was started on Synthroid.. Patient is stable in the facility. Her only complaint was constipation. She was very active before the stroke.  She was driving.  She lives with her husband.   Past Medical History:  Diagnosis Date  . Diabetes mellitus without complication ([place])   . Diverticulitis   . Fibromyalgia   . Hyperlipidemia   . Hypertension   . Papule 03/22/2015   Past Surgical History:  Procedure Laterality Date  . CHOLECYSTECTOMY    . KNEE SURGERY Left     reports that she quit smoking about 16 years ago. Her smoking use included cigarettes. She has a 57.00 pack-year smoking history. She has never used smokeless tobacco. She reports that she drinks alcohol. She reports that she does not use drugs. Social History   Socioeconomic History  . Marital status: Married    Spouse name: Not on file  . Number of children: Not on file  . Years of education: Not on file  . Highest education level: Not on file  Occupational History  . Not on file  Social Needs  . Financial resource strain: Not on file  . Food insecurity:    Worry: Not on file    Inability: Not on file  . Transportation needs:    Medical: Not on file    Non-medical: Not on file  Tobacco Use  . Smoking status: Former Smoker    Packs/day: 1.50    Years: 38.00    Pack years: 57.00    Types: Cigarettes    Last attempt to quit: 03/26/2001  Years since quitting: 16.6  . Smokeless tobacco: Never Used  Substance and Sexual Activity  . Alcohol use: Yes    Alcohol/week: 0.0 oz    Comment: occ glass of wine  . Drug use: No  . Sexual activity: Never    Birth control/protection: Post-menopausal  Lifestyle  . Physical activity:    Days per week: Not on file    Minutes per session: Not on file  . Stress: Not on file  Relationships  . Social connections:    Talks on phone: Not on file    Gets together: Not on file    Attends religious service: Not on file    Active member of club or organization:  Not on file    Attends meetings of clubs or organizations: Not on file    Relationship status: Not on file  . Intimate partner violence:    Fear of current or ex partner: Not on file    Emotionally abused: Not on file    Physically abused: Not on file    Forced sexual activity: Not on file  Other Topics Concern  . Not on file  Social History Narrative  . Not on file    Functional Status Survey:    Family History  Problem Relation Age of Onset  . Diabetes Mother   . Dementia Mother   . Hypertension Mother   . Alcohol abuse Father   . Cirrhosis Father   . Arthritis Sister        rheumatoid  . Diabetes Brother   . Hypertension Brother   . Diabetes Maternal Grandmother   . Fibromyalgia Sister   . Hypertension Sister   . Fibromyalgia Sister   . Hypertension Sister   . Diabetes Sister   . Hypertension Sister   . Cirrhosis Brother   . Cancer Brother        pancreatic    Health Maintenance  Topic Date Due  . PNEUMOCOCCAL POLYSACCHARIDE VACCINE (1) 12/29/2017 (Originally 09/06/1957)  . FOOT EXAM  12/29/2017 (Originally 09/06/1964)  . MAMMOGRAM  12/29/2017 (Originally 08/15/2017)  . PAP SMEAR  12/29/2017 (Originally 03/19/2017)  . OPHTHALMOLOGY EXAM  12/29/2017 (Originally 09/06/1964)  . URINE MICROALBUMIN  12/29/2017 (Originally 09/06/1964)  . COLONOSCOPY  12/29/2017 (Originally 09/06/2004)  . TETANUS/TDAP  12/29/2017 (Originally 09/06/1973)  . Hepatitis C Screening  12/29/2017 (Originally [birth year])  . INFLUENZA VACCINE  12/30/2017 (Originally 11/29/2017)  . HEMOGLOBIN A1C  05/29/2018  . HIV Screening  Completed    Allergies  Allergen Reactions  . Bee Pollen Anaphylaxis and Swelling  . Bee Venom   . Codeine     REACTION: nauseated,claustrophobic    Outpatient Encounter Medications as of 11/29/2017  Medication Sig  . amlodipine-benazepril (LOTREL) 2.5-10 MG capsule Take 1 capsule by mouth daily.   Marland Kitchen aspirin 325 MG tablet Take 1 tablet (325 mg total) by mouth daily.  Marland Kitchen  atorvastatin (LIPITOR) 40 MG tablet Take 1 tablet (40 mg total) by mouth daily at 6 PM.  . Blood Glucose Monitoring Suppl (ONETOUCH VERIO) w/Device KIT USE TO CHECK GLUCOSE THREE TIMES DAILY  . diclofenac sodium (VOLTAREN) 1 % GEL Apply 3 g to 3 large joints up to 3 times a day when necessary;  dispense 10 tubes with 1 refill  . escitalopram (LEXAPRO) 10 MG tablet Take 10 mg by mouth daily.   . fenofibrate 160 MG tablet Take 160 mg by mouth daily.   . insulin detemir (LEVEMIR) 100 UNIT/ML injection Inject 0.3 mLs (30 Units total)  into the skin daily.  Marland Kitchen levothyroxine (SYNTHROID, LEVOTHROID) 50 MCG tablet Take 1 tablet (50 mcg total) by mouth daily before breakfast.  . metFORMIN (GLUCOPHAGE) 1000 MG tablet Take 1 tablet (1,000 mg total) by mouth 2 (two) times daily with a meal.  . nabumetone (RELAFEN) 500 MG tablet Take 1 tablet by mouth 2 (two) times daily.  Marland Kitchen omega-3 acid ethyl esters (LOVAZA) 1 g capsule Take 1 capsule (1 g total) by mouth 2 (two) times daily.  Glory Rosebush VERIO test strip USE 1 STRIP TO CHECK GLUCOSE THREE TIMES DAILY  . pantoprazole (PROTONIX) 40 MG tablet Take 1 tablet (40 mg total) by mouth daily.  Marland Kitchen zolpidem (AMBIEN) 10 MG tablet Take 1 tablet (10 mg total) by mouth at bedtime as needed.  . [DISCONTINUED] nicotine (NICODERM CQ - DOSED IN MG/24 HOURS) 21 mg/24hr patch Place 1 patch (21 mg total) onto the skin daily.   No facility-administered encounter medications on file as of 11/29/2017.      Review of Systems  Review of Systems  Constitutional: Negative for activity change, appetite change, chills, diaphoresis, fatigue and fever.  HENT: Negative for mouth sores, postnasal drip, rhinorrhea, sinus pain and sore throat.   Respiratory: Negative for apnea, cough, chest tightness, shortness of breath and wheezing.   Cardiovascular: Negative for chest pain, palpitations and leg swelling.  Gastrointestinal: Negative for abdominal distention, abdominal pain,  diarrhea, nausea  and vomiting.  Genitourinary: Negative for dysuria and frequency.  Musculoskeletal: Negative for arthralgias, joint swelling and myalgias.  Skin: Negative for rash.  Neurological: Negative for dizziness, syncope, weakness, light-headedness and numbness.  Psychiatric/Behavioral: Negative for behavioral problems, confusion and sleep disturbance.     Vitals:   11/29/17 0927  BP: (!) 146/78  Pulse: 80  Resp: 18  Temp: 97.7 F (36.5 C)  TempSrc: Oral   There is no height or weight on file to calculate BMI. Physical Exam  Constitutional: She is oriented to person, place, and time. She appears well-developed and well-nourished.  HENT:  Head: Normocephalic.  Mouth/Throat: Oropharynx is clear and moist.  Eyes: Pupils are equal, round, and reactive to light.  Neck: Neck supple.  Cardiovascular: Normal rate and regular rhythm.  No murmur heard. Pulmonary/Chest: Effort normal and breath sounds normal. No stridor. No respiratory distress.  Abdominal: Soft. Bowel sounds are normal. She exhibits no distension. There is no tenderness. There is no guarding.  Musculoskeletal: She exhibits no edema.  Neurological: She is alert and oriented to person, place, and time.  Has Right Sided facial Droop. Right UE 0/5 Right LE 2/5 Left UE 5/5 Left  5/5  Skin: Skin is warm and dry.  Psychiatric: She has a normal mood and affect. Her behavior is normal. Thought content normal.    Labs reviewed: Basic Metabolic Panel: Recent Labs    11/26/17 0631 11/27/17 0601 11/29/17 0700  NA 140 138 139  K 3.7 3.8 3.9  CL 110 107 106  CO2 24 21* 23  GLUCOSE 240* 288* 195*  BUN 19 22 38*  CREATININE 0.89 1.07* 1.30*  CALCIUM 9.2 9.1 9.0   Liver Function Tests: Recent Labs    11/25/17 0651  AST 35  ALT 34  ALKPHOS 87  BILITOT 0.6  PROT 7.2  ALBUMIN 3.4*   No results for input(s): LIPASE, AMYLASE in the last 8760 hours. No results for input(s): AMMONIA in the last 8760 hours. CBC: Recent Labs      11/25/17 0651 11/26/17 0631 11/27/17 0601 11/29/17 0700  WBC 6.5 7.4 7.5 5.8  NEUTROABS 3.3  --   --  2.4  HGB 14.0 13.8 14.0 13.6  HCT 41.3 40.5 40.9 39.9  MCV 91.4 91.4 91.5 92.1  PLT 202 235 229 232   Cardiac Enzymes: No results for input(s): CKTOTAL, CKMB, CKMBINDEX, TROPONINI in the last 8760 hours. BNP: Invalid input(s): POCBNP Lab Results  Component Value Date   HGBA1C 9.5 (H) 11/26/2017   Lab Results  Component Value Date   TSH 8.579 (H) 11/25/2017   No results found for: VITAMINB12 No results found for: FOLATE No results found for: IRON, TIBC, FERRITIN  Imaging and Procedures obtained prior to SNF admission: No results found.  Assessment/Plan  Cerebrovascular accident (CVA) Started on aspirin. No dose of emboli was found  But due to signs of old infarct on MRI neurology recommended 30-day cardiac monitor Secondary prevention with Lipitor  Type 2 diabetes mellitus Seems like she was started on insulin in this admission Continue on metformin Her A1c was 9.5 Goal is less than 7 Accu-Chek q. before meals and nightly Essential hypertension Blood pressure was high for last few days but has now come down Continue on her home dose of amlodipine and benazepril  Hyperlipidemia LDL was 121 in the hospital Started on Lipitor  Fibromyalgia Continue on Lexapro  Depression,  Continue on Lexapro Renal insufficiency Creatinine mildly high We will continue to monitor Repeat BMP in a week  Hypothyroidism New diagnosis in the hospital with high TSH Started on Synthroid  Discharge Planning We will start patient on therapy she plans to go home with her husband     Family/ staff Communication:   Labs/tests ordered:  Total time spent in this patient care encounter was 45_ minutes; greater than 50% of the visit spent counseling patient, reviewing records , Labs and coordinating care for problems addressed at this encounter.

## 2017-11-30 ENCOUNTER — Telehealth: Payer: Self-pay

## 2017-11-30 ENCOUNTER — Inpatient Hospital Stay (INDEPENDENT_AMBULATORY_CARE_PROVIDER_SITE_OTHER): Payer: Medicare HMO

## 2017-11-30 DIAGNOSIS — R001 Bradycardia, unspecified: Secondary | ICD-10-CM | POA: Diagnosis not present

## 2017-11-30 DIAGNOSIS — I639 Cerebral infarction, unspecified: Secondary | ICD-10-CM

## 2017-11-30 DIAGNOSIS — I499 Cardiac arrhythmia, unspecified: Secondary | ICD-10-CM | POA: Diagnosis not present

## 2017-11-30 DIAGNOSIS — E109 Type 1 diabetes mellitus without complications: Secondary | ICD-10-CM | POA: Diagnosis not present

## 2017-11-30 NOTE — Telephone Encounter (Signed)
-----   Message from Dyane Dustmanerry L Goins sent at 11/27/2017  1:18 PM EDT ----- Regarding: 30 Day Event Monitor 30 day event monitor for Stroke per Dr.[place]. Patient is being [facility]'d [facility] to [facility].   Thanks, Newell Rubbermaiderry

## 2017-11-30 NOTE — Telephone Encounter (Signed)
Enrolled in [employer] Event monitor, serial number [id number]    Monitor picked up by L[facility] from [facility]

## 2017-12-03 ENCOUNTER — Other Ambulatory Visit: Payer: Self-pay

## 2017-12-03 DIAGNOSIS — I639 Cerebral infarction, unspecified: Secondary | ICD-10-CM

## 2017-12-06 ENCOUNTER — Encounter (HOSPITAL_COMMUNITY)
Admission: RE | Admit: 2017-12-06 | Discharge: 2017-12-06 | Disposition: A | Discharge status: Home / Self Care | Payer: Medicare HMO | Source: Skilled Nursing Facility | Attending: Internal Medicine | Admitting: Internal Medicine

## 2017-12-06 DIAGNOSIS — I10 Essential (primary) hypertension: Secondary | ICD-10-CM | POA: Insufficient documentation

## 2017-12-06 LAB — BASIC METABOLIC PANEL
Anion gap: 5 (ref 5–15)
BUN: 27 mg/dL — AB (ref 8–23)
CHLORIDE: 109 mmol/L (ref 98–111)
CO2: 26 mmol/L (ref 22–32)
Calcium: 9.7 mg/dL (ref 8.9–10.3)
Creatinine, Ser: 0.98 mg/dL (ref 0.44–1.00)
GFR calc Af Amer: >60 mL/min (ref >60)
GFR calc non Af Amer: >60 mL/min (ref >60)
GLUCOSE: 224 mg/dL — AB (ref 70–99)
POTASSIUM: 3.6 mmol/L (ref 3.5–5.1)
SODIUM: 140 mmol/L (ref 135–145)

## 2017-12-06 LAB — CBC WITH DIFFERENTIAL/PLATELET
Basophils Absolute: 0 10*3/uL (ref 0.0–0.1)
Basophils Relative: 0 %
Eosinophils Absolute: 0.1 10*3/uL (ref 0.0–0.7)
Eosinophils Relative: 2 %
HCT: 37.1 % (ref 36.0–46.0)
HEMOGLOBIN: 12 g/dL (ref 12.0–15.0)
LYMPHS ABS: 3.5 10*3/uL (ref 0.7–4.0)
LYMPHS PCT: 60 %
MCH: 29.9 pg (ref 26.0–34.0)
MCHC: 32.3 g/dL (ref 30.0–36.0)
MCV: 92.5 fL (ref 78.0–100.0)
MONOS PCT: 6 %
Monocytes Absolute: 0.3 10*3/uL (ref 0.1–1.0)
Neutro Abs: 1.8 10*3/uL (ref 1.7–7.7)
Neutrophils Relative %: 32 %
Platelets: 221 10*3/uL (ref 150–400)
RBC: 4.01 MIL/uL (ref 3.87–5.11)
RDW: 14.1 % (ref 11.5–15.5)
WBC: 5.8 10*3/uL (ref 4.0–10.5)

## 2017-12-14 ENCOUNTER — Other Ambulatory Visit (HOSPITAL_COMMUNITY): Payer: Self-pay | Admitting: Specialist

## 2017-12-14 DIAGNOSIS — R1319 Other dysphagia: Secondary | ICD-10-CM

## 2017-12-17 ENCOUNTER — Ambulatory Visit (HOSPITAL_COMMUNITY)
Admission: RE | Admit: 2017-12-17 | Discharge: 2017-12-17 | Disposition: A | Discharge status: Home / Self Care | Payer: Medicare HMO | Source: Ambulatory Visit | Attending: Internal Medicine | Admitting: Internal Medicine

## 2017-12-17 ENCOUNTER — Ambulatory Visit (HOSPITAL_COMMUNITY): Payer: Medicare HMO | Attending: Internal Medicine | Admitting: Speech Pathology

## 2017-12-17 ENCOUNTER — Encounter (HOSPITAL_COMMUNITY): Payer: Self-pay | Admitting: Speech Pathology

## 2017-12-17 DIAGNOSIS — Z8673 Personal history of transient ischemic attack (TIA), and cerebral infarction without residual deficits: Secondary | ICD-10-CM | POA: Insufficient documentation

## 2017-12-17 DIAGNOSIS — I10 Essential (primary) hypertension: Secondary | ICD-10-CM | POA: Insufficient documentation

## 2017-12-17 DIAGNOSIS — R1310 Dysphagia, unspecified: Secondary | ICD-10-CM | POA: Diagnosis not present

## 2017-12-17 DIAGNOSIS — E119 Type 2 diabetes mellitus without complications: Secondary | ICD-10-CM | POA: Diagnosis not present

## 2017-12-17 DIAGNOSIS — R1312 Dysphagia, oropharyngeal phase: Secondary | ICD-10-CM

## 2017-12-17 DIAGNOSIS — Z96659 Presence of unspecified artificial knee joint: Secondary | ICD-10-CM | POA: Insufficient documentation

## 2017-12-17 DIAGNOSIS — R1319 Other dysphagia: Secondary | ICD-10-CM

## 2017-12-17 DIAGNOSIS — I639 Cerebral infarction, unspecified: Secondary | ICD-10-CM | POA: Diagnosis not present

## 2017-12-17 NOTE — Therapy (Signed)
[facility] [address] [place], [place], [address] Phone: [phone]   Fax:  [phone]  Modified Barium Swallow  Patient Details  Name: [name] MRN: [id number] Date of Birth: 07/15/1955 No data recorded  Encounter Date: 12/17/2017  End of Session - 12/17/17 1623    Visit Number  1    Number of Visits  1    Authorization Type  [employer] Medicare    SLP Start Time  1330    SLP Stop Time   1405    SLP Time Calculation (min)  35 min    Activity Tolerance  Patient tolerated treatment well       Past Medical History:  Diagnosis Date  . Diabetes mellitus without complication ([facility])   . Diverticulitis   . Fibromyalgia   . Hyperlipidemia   . Hypertension   . Papule 03/22/2015    Past Surgical History:  Procedure Laterality Date  . CHOLECYSTECTOMY    . KNEE SURGERY Left     There were no vitals filed for this visit.  Subjective Assessment - 12/17/17 1610    Subjective  "She occasionally still coughs when drinking liquids."    Currently in Pain?  No/denies          General - 12/17/17 1610      General Information   Date of Onset  11/25/17    HPI  [place] Schaefer is a 62 yo female who sustained left lateral lenticulostriate infarcts at the end of July. She was referred for MBSS by Dr. Einar CrowAnjali [facility].     Type of Study  MBS-Modified Barium Swallow Study    Diet Prior to this Study  Regular;Thin liquids    Temperature Spikes Noted  No    Respiratory Status  Room air    History of Recent Intubation  No    Behavior/Cognition  Cooperative;Alert    Oral Cavity Assessment  Within Functional Limits    Oral Care Completed by SLP  No    Oral Cavity - Dentition  Adequate natural dentition    Vision  Functional for self feeding    Self-Feeding Abilities  Able to feed self    Patient Positioning  Upright in chair    Baseline Vocal Quality  Normal    Volitional Cough  Strong    Volitional  Swallow  Able to elicit    Anatomy  Within functional limits    Pharyngeal Secretions  Not observed secondary MBS         Oral Preparation/Oral Phase - 12/17/17 1615      Oral Preparation/Oral Phase   Oral Phase  Impaired      Oral - Solids   Oral - Puree  Piecemeal swallowing    Oral - Pill  Delayed A-P transit      Electrical stimulation - Oral Phase   Was Electrical Stimulation Used  No       Pharyngeal Phase - 12/17/17 1618      Pharyngeal Phase   Pharyngeal Phase  Impaired      Pharyngeal - Thin   Pharyngeal- Thin Teaspoon  Swallow initiation at pyriform sinus    Pharyngeal- Thin Cup  Within functional limits    Pharyngeal- Thin Straw  Swallow initiation at pyriform sinus;Penetration/Aspiration during swallow    Pharyngeal  Material enters airway, remains ABOVE vocal cords then ejected out;Material does not enter airway      Pharyngeal - Solids   Pharyngeal-  Puree  Swallow initiation at vallecula    Pharyngeal- Regular  Within functional limits    Pharyngeal- Pill  Penetration/Apiration after swallow    Pharyngeal  Material enters airway, passes BELOW cords then ejected out   Pt aspirated trace thin when taking pill     Pharyngeal Phase - Comment   Pharyngeal Comment  trace aspiration (beneath cords briefly and expelled immediately when taking thin with pill)      Electrical Stimulation - Pharyngeal Phase   Was Electrical Stimulation Used  No       Cricopharyngeal Phase - 12/17/17 1621      Cervical Esophageal Phase   Cervical Esophageal Phase  Within functional limits               Plan - 12/17/17 1623    Clinical Impression Statement  Pt presents with mild oropharyngeal phase dysphagia characterized by mild delay in swallow initiation. Pt had difficulty manipulating barium tablet orally and tablet retained in oral cavity after first swallow of thin. Once pill was swallowed, Pt with aspiration of thin immediately following pill, but material was  immediately expelled. When taking straw sips, the swallow was triggered just as thins reached the pyriforms and flash penetration observed (underepiglottic coating) which can be a normal variant. Recommend regular textures with thin liqudis and avoid use of straws for now, take small sips. Pt may experience episodic aspiration events due to recent stroke and occasional delays in swallow initiation, however Pt immediately expelled it today and has a strong cough.        Patient will benefit from skilled therapeutic intervention in order to improve the following deficits and impairments:   Dysphagia, oropharyngeal phase    Recommendations/Treatment - 12/17/17 1621      Swallow Evaluation Recommendations   SLP Diet Recommendations  Age appropriate regular;Thin    Liquid Administration via  Cup   avoid straws   Supervision  Patient able to self feed    Compensations  Slow rate;Small sips/bites    Postural Changes  Seated upright at 90 degrees;Remain upright for at least 30 minutes after feeds/meals       Prognosis - 12/17/17 1622      Prognosis   Prognosis for Safe Diet Advancement  Good      Individuals Consulted   Report Sent to   Facility (Comment)       Problem List Patient Active Problem List   Diagnosis Date Noted  . Hypothyroidism 11/29/2017  . Cerebrovascular accident (CVA) (HCC)   . Ischemic stroke (HCC)   . Type 2 diabetes mellitus with hyperglycemia (HCC) 11/25/2017  . HLD (hyperlipidemia) 11/25/2017  . Fibromyalgia 11/25/2017  . Depression 11/25/2017  . Tobacco abuse 11/25/2017  . Papule 03/22/2015  . Hypertension   . Acute diverticulitis 11/08/2014  . Diabetes (HCC) 11/08/2014  . Renal insufficiency 11/08/2014  . Abdominal pain 11/08/2014  . Left leg weakness 04/10/2011  . Abnormal gait 04/10/2011  . S/P total knee replacement 12/28/2010  . Knee pain 12/28/2010   Thank you,  Havery MorosDabney Davarion Cuffee, CCC-SLP [phone]  [facility],Dillyn Menna 12/17/2017, 4:29  PM  [facility] [address] [place], [place], [address] Phone: [phone]   Fax:  [phone]  Name: [name] MRN: [id number] Date of Birth: 01/11/1956

## 2017-12-25 ENCOUNTER — Non-Acute Institutional Stay (SKILLED_NURSING_FACILITY): Payer: Medicare HMO | Admitting: Internal Medicine

## 2017-12-25 ENCOUNTER — Ambulatory Visit (HOSPITAL_COMMUNITY)
Admission: RE | Admit: 2017-12-25 | Discharge: 2017-12-25 | Disposition: A | Discharge status: Home / Self Care | Payer: Medicare HMO | Source: Ambulatory Visit | Attending: Internal Medicine | Admitting: Internal Medicine

## 2017-12-25 ENCOUNTER — Encounter: Payer: Self-pay | Admitting: Internal Medicine

## 2017-12-25 DIAGNOSIS — E1165 Type 2 diabetes mellitus with hyperglycemia: Secondary | ICD-10-CM

## 2017-12-25 DIAGNOSIS — M79671 Pain in right foot: Secondary | ICD-10-CM | POA: Diagnosis not present

## 2017-12-25 DIAGNOSIS — I10 Essential (primary) hypertension: Secondary | ICD-10-CM | POA: Diagnosis not present

## 2017-12-25 DIAGNOSIS — I639 Cerebral infarction, unspecified: Secondary | ICD-10-CM

## 2017-12-25 DIAGNOSIS — N289 Disorder of kidney and ureter, unspecified: Secondary | ICD-10-CM

## 2017-12-25 DIAGNOSIS — R600 Localized edema: Secondary | ICD-10-CM | POA: Diagnosis not present

## 2017-12-25 DIAGNOSIS — I82409 Acute embolism and thrombosis of unspecified deep veins of unspecified lower extremity: Secondary | ICD-10-CM | POA: Insufficient documentation

## 2017-12-25 NOTE — Progress Notes (Signed)
Location:    [facility] Room Number: 158/P Place of Service:  SNF [id number]) Provider:  Loma Sousa, PA-C  Patient Care Team: Scherrie Bateman as PCP - General [facility] Medicine)  Extended Emergency Contact Information Primary Emergency Contact: [facility] Address: [address]          [place], [place] [address] Johnnette Litter of [place] Phone: [phone] Mobile Phone: [phone] Relation: Spouse Secondary Emergency Contact: Suzanna Obey [place] of [place] Phone: [phone] Mobile Phone: [phone] Relation: Sister  Code Status:  Full Code Goals of care: Advanced Directive information Advanced Directives 12/25/2017  Does Patient Have a Medical Advance Directive? Yes  Type of Advance Directive (No Data)  Does patient want to make changes to medical advance directive? No - Patient declined  Would patient like information on creating a medical advance directive? No - Patient declined    Chief complaint-acute visit secondary to right foot pain   HPI:  Pt is a 62 y.o. female seen today for an acute visit for complaints of increased right foot pain.  Patient is here for rehab after sustaining a left MCA stroke with right-sided hemiparalysis.  She has a history of hypertension as well as type 2 diabetes hyperlipidemia and fibromyalgia.  Status post CVA she was started on aspirin Lipitor for secondary prevention.  She is also started on insulin because her hemoglobin A1c was 9.5.  Her TSH also was high and she has been started on Synthroid.  She has been stable in the facility but apparently several days ago she reports that her right foot did have contact possibly run over by a wheelchair  She said she had some pain which is persistent.  She does not have any other complaints today.  I do note her blood pressures appear to be running somewhat high and with systolics in the 299M to 426S I got  170/70 manually today  She is on a Norvasc benazepril combination currently 2.5-10 mg.     Past Medical History:  Diagnosis Date  . Diabetes mellitus without complication ([place])   . Diverticulitis   . Fibromyalgia   . Hyperlipidemia   . Hypertension   . Papule 03/22/2015   Past Surgical History:  Procedure Laterality Date  . CHOLECYSTECTOMY    . KNEE SURGERY Left     Allergies  Allergen Reactions  . Bee Pollen Anaphylaxis and Swelling  . Bee Venom   . Codeine     REACTION: nauseated,claustrophobic    Outpatient Encounter Medications as of 12/25/2017  Medication Sig  . amlodipine-benazepril (LOTREL) 2.5-10 MG capsule Take 1 capsule by mouth daily.   Marland Kitchen aspirin 325 MG tablet Take 1 tablet (325 mg total) by mouth daily.  Marland Kitchen atorvastatin (LIPITOR) 40 MG tablet Take 1 tablet (40 mg total) by mouth daily at 6 PM.  . Blood Glucose Monitoring Suppl (ONETOUCH VERIO) w/Device KIT USE TO CHECK GLUCOSE THREE TIMES DAILY  . diclofenac sodium (VOLTAREN) 1 % GEL Apply 3 g to 3 large joints up to 3 times a day when necessary;  dispense 10 tubes with 1 refill  . escitalopram (LEXAPRO) 10 MG tablet Take 10 mg by mouth daily.   . fenofibrate 160 MG tablet Take 160 mg by mouth daily.   . insulin detemir (LEVEMIR) 100 UNIT/ML injection Inject 0.3 mLs (30 Units total) into the skin daily.  Marland Kitchen levothyroxine (SYNTHROID, LEVOTHROID) 50 MCG tablet Take 1 tablet (50 mcg total) by mouth daily  before breakfast.  . metFORMIN (GLUCOPHAGE) 1000 MG tablet Take 1 tablet (1,000 mg total) by mouth 2 (two) times daily with a meal.  . omega-3 acid ethyl esters (LOVAZA) 1 g capsule Take 1 capsule (1 g total) by mouth 2 (two) times daily.  Glory Rosebush VERIO test strip USE 1 STRIP TO CHECK GLUCOSE THREE TIMES DAILY  . polyethylene glycol (MIRALAX / GLYCOLAX) packet Take 17 g by mouth daily as needed.  . [DISCONTINUED] nabumetone (RELAFEN) 500 MG tablet Take 1 tablet by mouth 2 (two) times daily.  . [DISCONTINUED]  pantoprazole (PROTONIX) 40 MG tablet Take 1 tablet (40 mg total) by mouth daily.  . [DISCONTINUED] zolpidem (AMBIEN) 10 MG tablet Take 1 tablet (10 mg total) by mouth at bedtime as needed.   No facility-administered encounter medications on file as of 12/25/2017.     Review of Systems  General she not complaining of any fever or chills.  Skin is not complaining of rashes or itching.  Head ears eyes nose mouth and throat is not complain of visual changes or sore throat.  Resp- is not complaining of shortness of breath or cough.   Cardiac is not complaining of chest pain has some right lower extremity edema  GI is not complaining of abdominal pain nausea vomiting diarrhea constipation.  GU does not complain of dysuria.  Musculoskeletal  Does have right-sided hemiparalysis and is complaining of some right foot pain  Neurologic is not complaining of dizziness headache or numbness has right-sided hemiparalysis.  Psych does not complain of being overtly depressed or anxious       Immunization History  Administered Date(s) Administered  . Influenza-Unspecified 01/20/2015  . Zoster 02/19/2015   Pertinent  Health Maintenance Due  Topic Date Due  . FOOT EXAM  12/29/2017 (Originally 09/06/1965)  . MAMMOGRAM  12/29/2017 (Originally 08/15/2017)  . PAP SMEAR  12/29/2017 (Originally 03/19/2017)  . OPHTHALMOLOGY EXAM  12/29/2017 (Originally 09/06/1965)  . URINE MICROALBUMIN  12/29/2017 (Originally 09/06/1965)  . COLONOSCOPY  12/29/2017 (Originally 09/06/2004)  . INFLUENZA VACCINE  12/30/2017 (Originally 11/29/2017)  . HEMOGLOBIN A1C  05/29/2018   No flowsheet data found. Functional Status Survey:    Vitals:   12/25/17 1100  BP: (!) 109/59  Pulse: 62  Resp: 17  Temp: (!) 97.3 F (36.3 C)  TempSrc: Oral  SpO2: 96%  Manual blood pressure was 170/70 Her weight is 170 pounds Physical Exam   In general this is a pleasant middle-age female in no distress sitting comfortably in her  wheelchair.  Her skin is warm and dry she does have several small tattoos including at the back of her neck and also lateral right foot  Eyes visual acuity appears to be intact sclera and conjunctive are clear.  Oropharynx is clear mucous membranes moist.  Chest is clear to auscultation there is no labored breathing.  Heart is regular rate and rhythm without murmur gallop or rub she does have 1+ right lower extremity edema pedal pulse is intact.  Abdomen is soft nontender somewhat obese with positive bowel sounds.  Musculoskeletal continues with right-sided hemiparalysis and facial droop-is able to move left upper and lower extremities it appears at baseline limited exam since she is in a wheelchair  There is some tenderness to palpation of the right foot with edema again pedal pulses intact I do not note any visual deformities-do not really note any increased warmth Touch sensation is intact  Neurologic again does have right-sided hemiparalysis-she is alert speech is clear.  Psych she is alert and oriented pleasant and appropriate Labs reviewed: Recent Labs    11/27/17 0601 11/29/17 0700 12/06/17 0516  NA 138 139 140  K 3.8 3.9 3.6  CL 107 106 109  CO2 21* 23 26  GLUCOSE 288* 195* 224*  BUN 22 38* 27*  CREATININE 1.07* 1.30* 0.98  CALCIUM 9.1 9.0 9.7   Recent Labs    11/25/17 0651  AST 35  ALT 34  ALKPHOS 87  BILITOT 0.6  PROT 7.2  ALBUMIN 3.4*   Recent Labs    11/25/17 0651  11/27/17 0601 11/29/17 0700 12/06/17 0516  WBC 6.5   < > 7.5 5.8 5.8  NEUTROABS 3.3  --   --  2.4 1.8  HGB 14.0   < > 14.0 13.6 12.0  HCT 41.3   < > 40.9 39.9 37.1  MCV 91.4   < > 91.5 92.1 92.5  PLT 202   < > 229 232 221   < > = values in this interval not displayed.   Lab Results  Component Value Date   TSH 8.579 (H) 11/25/2017   Lab Results  Component Value Date   HGBA1C 9.5 (H) 11/26/2017   Lab Results  Component Value Date   CHOL 218 (H) 11/26/2017   HDL 25 (L)  11/26/2017   LDLCALC 121 (H) 11/26/2017   TRIG 360 (H) 11/26/2017   CHOLHDL 8.7 11/26/2017    Significant Diagnostic Results in last 30 days:  Mr Brain Wo Contrast  Result Date: 11/26/2017 CLINICAL DATA:  Right-sided weakness for 2 days. EXAM: MRI HEAD WITHOUT CONTRAST TECHNIQUE: Multiplanar, multiecho pulse sequences of the brain and surrounding structures were obtained without intravenous contrast. COMPARISON:  Head CT 11/25/2017 FINDINGS: Brain: There is an acute infarct involving the posterior left lentiform nucleus and corona radiata. An adjacent infarct immediately anterior to this has a subacute appearance. No intracranial hemorrhage, mass, midline shift, or extra-axial fluid collection is identified. The ventricles and sulci are within normal limits for age. Small foci of T2 hyperintensity scattered throughout the subcortical and deep cerebral white matter and pons are nonspecific but compatible with mild chronic small vessel ischemic disease. Chronic lacunar infarcts are noted in the right basal ganglia, right internal capsule, and thalami. Vascular: Major intracranial vascular flow voids are preserved. Skull and upper cervical spine: No suspicious marrow lesion. Sinuses/Orbits: Unremarkable orbits. Paranasal sinuses and mastoid air cells are clear. Other: None. IMPRESSION: 1. Acute and subacute left lateral lenticulostriate territory infarcts. 2. Chronic small vessel ischemia with multiple chronic deep gray nuclei and white matter infarcts. Electronically Signed   By: Logan Bores M.D.   On: 11/26/2017 10:05   Dg Op Swallowing Func-medicare/speech Path  Result Date: 12/17/2017 [place], [place], [address] Phone: [phone]   Fax:  [phone] Modified Barium Swallow Patient Details Name: AMANY RANDO MRN: [id number] Date of Birth: 1956/02/06 No data recorded Encounter Date: 12/17/2017 End of Session - 12/17/17 1623   Visit  Number  1   Number of Visits  1   Authorization Type  [employer]   SLP Start Time  1330   SLP Stop Time   1405   SLP Time Calculation (min)  35 min   Activity Tolerance  Patient tolerated treatment well   Past Medical History: Diagnosis Date . Diabetes mellitus without complication ([place])  . Diverticulitis  . Fibromyalgia  . Hyperlipidemia  . Hypertension  . Papule 03/22/2015 Past Surgical  History: Procedure Laterality Date . CHOLECYSTECTOMY   . KNEE SURGERY Left  There were no vitals filed for this visit. Subjective Assessment - 12/17/17 1610   Subjective  "She occasionally still coughs when drinking liquids."   Currently in Pain?  No/denies   General - 12/17/17 1610    General Information  Date of Onset  11/25/17   HPI  Vermont Lennox is a 62 yo female who sustained left lateral lenticulostriate infarcts at the end of July. She was referred for MBSS by Dr. Veleta Miners at the [facility] center.    Type of Study  MBS-Modified Barium Swallow Study   Diet Prior to this Study  Regular;Thin liquids   Temperature Spikes Noted  No   Respiratory Status  Room air   History of Recent Intubation  No   Behavior/Cognition  Cooperative;Alert   Oral Cavity Assessment  Within Functional Limits   Oral Care Completed by SLP  No   Oral Cavity - Dentition  Adequate natural dentition   Vision  Functional for self feeding   Self-Feeding Abilities  Able to feed self   Patient Positioning  Upright in chair   Baseline Vocal Quality  Normal   Volitional Cough  Strong   Volitional Swallow  Able to elicit   Anatomy  Within functional limits   Pharyngeal Secretions  Not observed secondary MBS   Oral Preparation/Oral Phase - 12/17/17 1615    Oral Preparation/Oral Phase  Oral Phase  Impaired    Oral - Solids  Oral - Puree  Piecemeal swallowing   Oral - Pill  Delayed A-P transit    Electrical stimulation - Oral Phase  Was Electrical Stimulation Used  No   Pharyngeal Phase - 12/17/17 1618    Pharyngeal Phase  Pharyngeal Phase  Impaired     Pharyngeal - Thin  Pharyngeal- Thin Teaspoon  Swallow initiation at pyriform sinus   Pharyngeal- Thin Cup  Within functional limits   Pharyngeal- Thin Straw  Swallow initiation at pyriform sinus;Penetration/Aspiration during swallow   Pharyngeal  Material enters airway, remains ABOVE vocal cords then ejected out;Material does not enter airway    Pharyngeal - Solids  Pharyngeal- Puree  Swallow initiation at vallecula   Pharyngeal- Regular  Within functional limits   Pharyngeal- Pill  Penetration/Apiration after swallow   Pharyngeal  Material enters airway, passes BELOW cords then ejected out  Pt aspirated trace thin when taking pill   Pharyngeal Phase - Comment  Pharyngeal Comment  trace aspiration (beneath cords briefly and expelled immediately when taking thin with pill)    Electrical Stimulation - Pharyngeal Phase  Was Electrical Stimulation Used  No   Cricopharyngeal Phase - 12/17/17 1621    Cervical Esophageal Phase  Cervical Esophageal Phase  Within functional limits   Plan - 12/17/17 1623   Clinical Impression Statement  Pt presents with mild oropharyngeal phase dysphagia characterized by mild delay in swallow initiation. Pt had difficulty manipulating barium tablet orally and tablet retained in oral cavity after first swallow of thin. Once pill was swallowed, Pt with aspiration of thin immediately following pill, but material was immediately expelled. When taking straw sips, the swallow was triggered just as thins reached the pyriforms and flash penetration observed (underepiglottic coating) which can be a normal variant. Recommend regular textures with thin liqudis and avoid use of straws for now, take small sips. Pt may experience episodic aspiration events due to recent stroke and occasional delays in swallow initiation, however Pt immediately expelled it today  and has a strong cough.    Patient will benefit from skilled therapeutic intervention in order to improve the following deficits and impairments:   Dysphagia, oropharyngeal phase Recommendations/Treatment - 12/17/17 1621    Swallow Evaluation Recommendations  SLP Diet Recommendations  Age appropriate regular;Thin   Liquid Administration via  Cup  avoid straws  Supervision  Patient able to self feed   Compensations  Slow rate;Small sips/bites   Postural Changes  Seated upright at 90 degrees;Remain upright for at least 30 minutes after feeds/meals   Prognosis - 12/17/17 1622    Prognosis  Prognosis for Safe Diet Advancement  Good    Individuals Consulted  Report Sent to   Facility (Comment)   Problem List Patient Active Problem List  Diagnosis Date Noted . Hypothyroidism 11/29/2017 . Cerebrovascular accident (CVA) ([place])  . Ischemic stroke ([place])  . Type 2 diabetes mellitus with hyperglycemia ([place]) 11/25/2017 . HLD (hyperlipidemia) 11/25/2017 . Fibromyalgia 11/25/2017 . Depression 11/25/2017 . Tobacco abuse 11/25/2017 . Papule 03/22/2015 . Hypertension  . Acute diverticulitis 11/08/2014 . Diabetes ([place]) 11/08/2014 . Renal insufficiency 11/08/2014 . Abdominal pain 11/08/2014 . Left leg weakness 04/10/2011 . Abnormal gait 04/10/2011 . S/P total knee replacement 12/28/2010 . Knee pain 12/28/2010 Thank you, Genene Churn, [facility] 12/17/2017, 4:29 PM [place] [address] [place], [place], [address] Phone: [phone]   Fax:  [phone] Name: MINERVIA OSSO MRN: [id number] Date of Birth: [birth year] CLINICAL DATA:  62 year old female with recent history of stroke. Dysphagia. EXAM: MODIFIED BARIUM SWALLOW TECHNIQUE: Different consistencies of barium were administered orally to the patient by the Speech Pathologist. Imaging of the pharynx was performed in the lateral projection. FLUOROSCOPY TIME:  Fluoroscopy Time:  1 minutes and 42 seconds. Radiation Exposure Index (if provided by the fluoroscopic device): 13.4 mGy COMPARISON:  None. FINDINGS: Thin liquid-Delayed oral transit. Premature spill over.  Delayed swallow trigger. Flash penetration. Pure- Delayed oral transit. Premature spill over. Delayed swallow trigger. Pure with cracker- Delayed oral transit. Premature spill over. Delayed swallow trigger. Barium tablet -  within normal limits IMPRESSION: 1. Abnormal speech study with findings, as above. Electronically Signed   By: Vinnie Langton M.D.   On: 12/17/2017 14:13   US Carotid Bilateral (at [facility] Only)  Result Date: 11/26/2017 CLINICAL DATA:  63 year old female with a history of right-sided weakness, stroke. Cardiovascular risk factors include hypertension, known prior stroke/TIA, hyperlipidemia, tobacco use, diabetes EXAM: BILATERAL CAROTID DUPLEX ULTRASOUND TECHNIQUE: Pearline Cables scale imaging, color Doppler and duplex ultrasound were performed of bilateral carotid and vertebral arteries in the neck. COMPARISON:  No prior duplex FINDINGS: Criteria: Quantification of carotid stenosis is based on velocity parameters that correlate the residual internal carotid diameter with NASCET-based stenosis levels, using the diameter of the distal internal carotid lumen as the denominator for stenosis measurement. The following velocity measurements were obtained: RIGHT ICA:  Systolic 132 cm/sec, Diastolic 22 cm/sec CCA:  440 cm/sec SYSTOLIC ICA/CCA RATIO:  0.9 ECA:  174 cm/sec LEFT ICA:  Systolic 102 cm/sec, Diastolic 26 cm/sec CCA:  725 cm/sec SYSTOLIC ICA/CCA RATIO:  0.7 ECA:  123 cm/sec Right Brachial SBP: Not acquired Left Brachial SBP: Not acquired RIGHT CAROTID ARTERY: No significant calcifications of the right common carotid artery. Intermediate waveform maintained. Heterogeneous and partially calcified plaque at the right carotid bifurcation. No significant lumen shadowing. Low resistance waveform of the right ICA. No significant tortuosity. RIGHT VERTEBRAL ARTERY: Antegrade flow with low resistance waveform. LEFT CAROTID ARTERY: No  significant calcifications of the left common carotid artery.  Intermediate waveform maintained. Heterogeneous and partially calcified plaque at the left carotid bifurcation without significant lumen shadowing. Low resistance waveform of the left ICA. No significant tortuosity. LEFT VERTEBRAL ARTERY:  Antegrade flow with low resistance waveform. IMPRESSION: Color duplex indicates minimal heterogeneous and calcified plaque, with no hemodynamically significant stenosis by duplex criteria in the extracranial cerebrovascular circulation. Signed, Dulcy Fanny. Dellia Nims, RPVI [facility] Electronically Signed   By: Corrie Mckusick D.O.   On: 11/26/2017 09:44   Mr Jodene Nam Head/brain NT Cm  Result Date: 11/26/2017 CLINICAL DATA:  Right-sided weakness for 2 days. EXAM: MRA HEAD WITHOUT CONTRAST TECHNIQUE: Angiographic images of the Circle of Willis were obtained using MRA technique without intravenous contrast. COMPARISON:  None. FINDINGS: The intracranial vertebral arteries are patent to the basilar and codominant. The left PICA and right AICA appear dominant. Patent SCA origins are visualized bilaterally. The basilar artery is widely patent. There is a large right posterior communicating artery. The PCAs are patent with multifocal severe P2 stenoses bilaterally. The internal carotid arteries are patent from skull base to carotid termini with mild left ICA stenosis near the petrous-cavernous junction. ACAs and MCAs are patent without evidence of significant A1 or M1 stenosis or proximal branch occlusion. There is a severe mid left M2 branch vessel stenosis, and there is a moderate distal left A2 stenosis. No aneurysm is identified. IMPRESSION: 1. No large vessel occlusion. 2. Intracranial atherosclerosis as above including severe bilateral P2 stenoses. Electronically Signed   By: Logan Bores M.D.   On: 11/26/2017 09:55    Assessment/Plan  #1 right foot pain-we will order an x-ray of the area-she has Tylenol as needed for pain  at this point she does not really want anything stronger will await x-ray results.  She also has an edema I suspect this is status post CVA she does have a good pedal pulse-but will order a venous Doppler to rule out any possibility of DVT.  2.  Hypertension systolic appears to be somewhat high although I see previous listed one today the systolic of 700- the ones I see listed however appear to be somewhat higher  and what I got on exam today Will increase Norvasc to 5 mg a day continues on benazepril 10 mg a day continue to monitor blood pressures every shift.  3 diabetes type 2- continues on Levemir 30 units every morning as well as Glucophage thousand milligrams twice daily- morning blood pressures appear to be in the lower mid 100s- somewhat more variability at noon ranging from the mid 100s to 200s at times.  At 4 PM blood sugars again appear to be more in the mid 100s averaging- and this appears to be the case as well at at bedtime  At this point monitor this appears to be reasonably well controlled  #4 history of CVA with right-sided hemiparalysis she continues on aspirin as well as a statin she appears to be doing relatively well with supportive care  #5-history of hypothyroidism she is on Synthroid will update a TSH.  6.  History of renal insufficiency-her creatinine appears to have come down to 0.98 previously was 1.3-will update this as well.  Addendum--we have received results of the foot x-ray which does show an acute fracture of the distal diaphysis of the fifth metatarsal with mild displacement and angulation.  I did discuss this with the therapy department-will order a cam boot to be worn  when she is out of bed-also partial weightbearing status through the heel-continue to monitor her status.  I did reassess her she is resting comfortably in bed says at this point her pain is controlled again could not really appreciate any visual deformity of the toe there is some mild  tenderness to palpation of it.  I did discuss possibility of an orthopedic consult but at this point patient will defer will monitor  We also have received results of the venous Doppler of the right leg which is negative for any DVT   [id number]-of note greater than 35 minutes spent assessing patient-reviewing her chart and labs discussing her status with nursing staff as well as with therapy department- and coordinating a plan of care for numerous diagnoses- of note greater than 50% of time spent coordinating a plan of care with input as noted above

## 2017-12-26 ENCOUNTER — Encounter (HOSPITAL_COMMUNITY)
Admission: RE | Admit: 2017-12-26 | Discharge: 2017-12-26 | Disposition: A | Discharge status: Home / Self Care | Payer: Medicare HMO | Source: Skilled Nursing Facility | Attending: Internal Medicine | Admitting: Internal Medicine

## 2017-12-26 DIAGNOSIS — E039 Hypothyroidism, unspecified: Secondary | ICD-10-CM | POA: Insufficient documentation

## 2017-12-26 DIAGNOSIS — E785 Hyperlipidemia, unspecified: Secondary | ICD-10-CM | POA: Insufficient documentation

## 2017-12-26 DIAGNOSIS — E1165 Type 2 diabetes mellitus with hyperglycemia: Secondary | ICD-10-CM | POA: Insufficient documentation

## 2017-12-26 DIAGNOSIS — Z5189 Encounter for other specified aftercare: Secondary | ICD-10-CM | POA: Insufficient documentation

## 2017-12-26 DIAGNOSIS — I639 Cerebral infarction, unspecified: Secondary | ICD-10-CM | POA: Insufficient documentation

## 2017-12-26 DIAGNOSIS — Z720 Tobacco use: Secondary | ICD-10-CM | POA: Insufficient documentation

## 2017-12-26 LAB — CBC WITH DIFFERENTIAL/PLATELET
Basophils Absolute: 0 10*3/uL (ref 0.0–0.1)
Basophils Relative: 0 %
Eosinophils Absolute: 0.2 10*3/uL (ref 0.0–0.7)
Eosinophils Relative: 3 %
HEMATOCRIT: 40 % (ref 36.0–46.0)
HEMOGLOBIN: 13.1 g/dL (ref 12.0–15.0)
LYMPHS ABS: 2.9 10*3/uL (ref 0.7–4.0)
LYMPHS PCT: 56 %
MCH: 30.3 pg (ref 26.0–34.0)
MCHC: 32.8 g/dL (ref 30.0–36.0)
MCV: 92.6 fL (ref 78.0–100.0)
Monocytes Absolute: 0.4 10*3/uL (ref 0.1–1.0)
Monocytes Relative: 7 %
NEUTROS ABS: 1.8 10*3/uL (ref 1.7–7.7)
NEUTROS PCT: 34 %
Platelets: 285 10*3/uL (ref 150–400)
RBC: 4.32 MIL/uL (ref 3.87–5.11)
RDW: 14 % (ref 11.5–15.5)
WBC: 5.3 10*3/uL (ref 4.0–10.5)

## 2017-12-26 LAB — BASIC METABOLIC PANEL
ANION GAP: 8 (ref 5–15)
BUN: 20 mg/dL (ref 8–23)
CHLORIDE: 106 mmol/L (ref 98–111)
CO2: 27 mmol/L (ref 22–32)
Calcium: 9.5 mg/dL (ref 8.9–10.3)
Creatinine, Ser: 0.95 mg/dL (ref 0.44–1.00)
GFR calc Af Amer: >60 mL/min (ref >60)
GLUCOSE: 137 mg/dL — AB (ref 70–99)
POTASSIUM: 4 mmol/L (ref 3.5–5.1)
Sodium: 141 mmol/L (ref 135–145)

## 2017-12-26 LAB — TSH: TSH: 2.33 u[IU]/mL (ref 0.350–4.500)

## 2018-01-01 ENCOUNTER — Encounter: Payer: Self-pay | Admitting: Internal Medicine

## 2018-01-01 ENCOUNTER — Non-Acute Institutional Stay (SKILLED_NURSING_FACILITY): Payer: Medicare HMO | Admitting: Internal Medicine

## 2018-01-01 DIAGNOSIS — I10 Essential (primary) hypertension: Secondary | ICD-10-CM | POA: Diagnosis not present

## 2018-01-01 DIAGNOSIS — I639 Cerebral infarction, unspecified: Secondary | ICD-10-CM | POA: Diagnosis not present

## 2018-01-01 DIAGNOSIS — E039 Hypothyroidism, unspecified: Secondary | ICD-10-CM

## 2018-01-01 DIAGNOSIS — E1165 Type 2 diabetes mellitus with hyperglycemia: Secondary | ICD-10-CM | POA: Diagnosis not present

## 2018-01-01 NOTE — Progress Notes (Signed)
Location:    [facility] Room Number: 158/P Place of Service:  SNF [phone])  Provider: Veleta Miners MD  PCP: Jake Samples, PA-C Patient Care Team: Scherrie Bateman as PCP - General [facility])  Extended Emergency Contact Information Primary Emergency Contact: [facility] Address: [address]          [place], [place] [address] Johnnette Litter of [place] Phone: [phone] Mobile Phone: [phone] Relation: Spouse Secondary Emergency Contact: Suzanna Obey [place] of [place] Phone: [phone] Mobile Phone: [phone] Relation: Sister  Code Status: Full Code Goals of care:  Advanced Directive information Advanced Directives 01/01/2018  Does Patient Have a Medical Advance Directive? Yes  Type of Advance Directive (No Data)  Does patient want to make changes to medical advance directive? No - Patient declined  Would patient like information on creating a medical advance directive? No - Patient declined     Allergies  Allergen Reactions  . Bee Pollen Anaphylaxis and Swelling  . Bee Venom   . Codeine     REACTION: nauseated,claustrophobic    Chief Complaint  Patient presents with  . Discharge Note    Discharge Visit    HPI:  62 y.o. female  Seen today for Discharge from the facility.  She stayed in the hospital from 07/28-0730 For Acute Left MCA Stroke. She was seen in the emergency room with 2-3 days of right-sided weakness.  She fell at home and then came to the ED for further evaluation.  She was found to have a dense right-sided hemiparesis, with facial droop and dysarthria.  She was not a candidate for TPA due to delayed presentation. Her MRI showed acute and subacute left lateral lenticulostriate  Infarcts. Her carotid Dopplers showed mild plaque.  2D echo was negative.  She was started on aspirin, Lipitor for secondary prevention. She was also started on insulin as her A1c was 9.5 Her TSH was high  and was started she was started on Synthroid.Marland Kitchen She was very active before the stroke.  She was driving.  She lives with her husband. Patient did well with therapy.  She is able to do her transfers.  Walk with hemiwalker and mild assist.She was also seen by Speech and had MBS which showed aspiration risk.  And she was put on modified dysphagia 3 diet. She also had a Doppler done of her legs due to  complaining of swelling and pain.  It was negative for any DVT She is planning to go home with her husband Past Medical History:  Diagnosis Date  . Diabetes mellitus without complication ([place])   . Diverticulitis   . Fibromyalgia   . Hyperlipidemia   . Hypertension   . Papule 03/22/2015    Past Surgical History:  Procedure Laterality Date  . CHOLECYSTECTOMY    . KNEE SURGERY Left       reports that she quit smoking about 16 years ago. Her smoking use included cigarettes. She has a 57.00 pack-year smoking history. She has never used smokeless tobacco. She reports that she drinks alcohol. She reports that she does not use drugs. Social History   Socioeconomic History  . Marital status: Married    Spouse name: Not on file  . Number of children: Not on file  . Years of education: Not on file  . Highest education level: Not on file  Occupational History  . Not on file  Social Needs  . Financial resource strain: Not on file  .  Food insecurity:    Worry: Not on file    Inability: Not on file  . Transportation needs:    Medical: Not on file    Non-medical: Not on file  Tobacco Use  . Smoking status: Former Smoker    Packs/day: 1.50    Years: 38.00    Pack years: 57.00    Types: Cigarettes    Last attempt to quit: 03/26/2001    Years since quitting: 16.7  . Smokeless tobacco: Never Used  Substance and Sexual Activity  . Alcohol use: Yes    Alcohol/week: 0.0 standard drinks    Comment: occ glass of wine  . Drug use: No  . Sexual activity: Never    Birth control/protection:  Post-menopausal  Lifestyle  . Physical activity:    Days per week: Not on file    Minutes per session: Not on file  . Stress: Not on file  Relationships  . Social connections:    Talks on phone: Not on file    Gets together: Not on file    Attends religious service: Not on file    Active member of club or organization: Not on file    Attends meetings of clubs or organizations: Not on file    Relationship status: Not on file  . Intimate partner violence:    Fear of current or ex partner: Not on file    Emotionally abused: Not on file    Physically abused: Not on file    Forced sexual activity: Not on file  Other Topics Concern  . Not on file  Social History Narrative  . Not on file   Functional Status Survey:    Allergies  Allergen Reactions  . Bee Pollen Anaphylaxis and Swelling  . Bee Venom   . Codeine     REACTION: nauseated,claustrophobic    Pertinent  Health Maintenance Due  Topic Date Due  . INFLUENZA VACCINE  01/31/2018 (Originally 11/29/2017)  . FOOT EXAM  01/31/2018 (Originally 09/06/1965)  . MAMMOGRAM  01/31/2018 (Originally 08/15/2017)  . PAP SMEAR  01/31/2018 (Originally 03/19/2017)  . OPHTHALMOLOGY EXAM  01/31/2018 (Originally 09/06/1964)  . URINE MICROALBUMIN  01/31/2018 (Originally 09/06/1964)  . COLONOSCOPY  01/31/2018 (Originally 09/06/2004)  . HEMOGLOBIN A1C  05/29/2018    Medications: Outpatient Encounter Medications as of 01/01/2018  Medication Sig  . amlodipine-benazepril (LOTREL) 2.5-10 MG capsule Take 1 capsule by mouth daily.   Marland Kitchen aspirin 325 MG tablet Take 1 tablet (325 mg total) by mouth daily.  Marland Kitchen atorvastatin (LIPITOR) 40 MG tablet Take 1 tablet (40 mg total) by mouth daily at 6 PM.  . Blood Glucose Monitoring Suppl (ONETOUCH VERIO) w/Device KIT USE TO CHECK GLUCOSE THREE TIMES DAILY  . diclofenac sodium (VOLTAREN) 1 % GEL Apply 3 g to 3 large joints up to 3 times a day when necessary;  dispense 10 tubes with 1 refill  . escitalopram (LEXAPRO) 10 MG  tablet Take 10 mg by mouth daily.   . fenofibrate 160 MG tablet Take 160 mg by mouth daily.   . insulin detemir (LEVEMIR) 100 UNIT/ML injection Inject 0.3 mLs (30 Units total) into the skin daily.  Marland Kitchen levothyroxine (SYNTHROID, LEVOTHROID) 50 MCG tablet Take 1 tablet (50 mcg total) by mouth daily before breakfast.  . metFORMIN (GLUCOPHAGE) 1000 MG tablet Take 1 tablet (1,000 mg total) by mouth 2 (two) times daily with a meal.  . omega-3 acid ethyl esters (LOVAZA) 1 g capsule Take 1 capsule (1 g total) by mouth 2 (  two) times daily.  Glory Rosebush VERIO test strip USE 1 STRIP TO CHECK GLUCOSE THREE TIMES DAILY  . polyethylene glycol (MIRALAX / GLYCOLAX) packet Take 17 g by mouth daily as needed.   No facility-administered encounter medications on file as of 01/01/2018.      Review of Systems  Vitals:   01/01/18 1033  BP: (!) 146/70  Pulse: 88  Resp: 18  Temp: 98.1 F (36.7 C)  TempSrc: Oral   There is no height or weight on file to calculate BMI. Physical Exam  Constitutional: She is oriented to person, place, and time. She appears well-developed and well-nourished.  HENT:  Head: Normocephalic.  Mouth/Throat: Oropharynx is clear and moist.  Eyes: Pupils are equal, round, and reactive to light.  Neck: Neck supple.  Cardiovascular: Normal rate and regular rhythm.  No murmur heard. Pulmonary/Chest: Effort normal and breath sounds normal. No stridor. No respiratory distress.  Abdominal: Soft. Bowel sounds are normal. She exhibits no distension. There is no tenderness. There is no guarding.  Musculoskeletal: She exhibits no edema.  Neurological: She is alert and oriented to person, place, and time.  Has Right Sided facial Droop. Right UE 0/5 Right LE 2/5 Left UE 5/5 Left  5/5  Skin: Skin is warm and dry.  Psychiatric: She has a normal mood and affect. Her behavior is normal. Thought content normal.    Labs reviewed: Basic Metabolic Panel: Recent Labs    11/29/17 0700 12/06/17 0516  12/26/17 0722  NA 139 140 141  K 3.9 3.6 4.0  CL 106 109 106  CO2 23 26 27   GLUCOSE 195* 224* 137*  BUN 38* 27* 20  CREATININE 1.30* 0.98 0.95  CALCIUM 9.0 9.7 9.5   Liver Function Tests: Recent Labs    11/25/17 0651  AST 35  ALT 34  ALKPHOS 87  BILITOT 0.6  PROT 7.2  ALBUMIN 3.4*   No results for input(s): LIPASE, AMYLASE in the last 8760 hours. No results for input(s): AMMONIA in the last 8760 hours. CBC: Recent Labs    11/29/17 0700 12/06/17 0516 12/26/17 0722  WBC 5.8 5.8 5.3  NEUTROABS 2.4 1.8 1.8  HGB 13.6 12.0 13.1  HCT 39.9 37.1 40.0  MCV 92.1 92.5 92.6  PLT 232 221 285   Cardiac Enzymes: No results for input(s): CKTOTAL, CKMB, CKMBINDEX, TROPONINI in the last 8760 hours. BNP: Invalid input(s): POCBNP CBG: Recent Labs    11/27/17 0624 11/27/17 0714 11/27/17 1113  GLUCAP 270* 243* 245*    Procedures and Imaging Studies During Stay: Dg Op Swallowing Func-medicare/speech Path  Result Date: 12/17/2017 [facility] [address] [place], [place], [address] Phone: [phone]   Fax:  [phone] Modified Barium Swallow Patient Details Name: SHANTOYA GEURTS MRN: [id number] Date of Birth: [birth year] No data recorded Encounter Date: 12/17/2017 End of Session - 12/17/17 1623   Visit Number  1   Number of Visits  1   Authorization Type  [employer]   SLP Start Time  1330   SLP Stop Time   1405   SLP Time Calculation (min)  35 min   Activity Tolerance  Patient tolerated treatment well   Past Medical History: Diagnosis Date . Diabetes mellitus without complication ([place])  . Diverticulitis  . Fibromyalgia  . Hyperlipidemia  . Hypertension  . Papule 03/22/2015 Past Surgical History: Procedure Laterality Date . CHOLECYSTECTOMY   . KNEE SURGERY Left  There were no vitals filed for this visit. Subjective  Assessment - 12/17/17 1610   Subjective  "She occasionally still coughs when drinking liquids."   Currently in Pain?  No/denies    General - 12/17/17 1610    General Information  Date of Onset  11/25/17   HPI  Vermont Losey is a 62 yo female who sustained left lateral lenticulostriate infarcts at the end of July. She was referred for MBSS by Dr. Veleta Miners at the [facility] center.    Type of Study  MBS-Modified Barium Swallow Study   Diet Prior to this Study  Regular;Thin liquids   Temperature Spikes Noted  No   Respiratory Status  Room air   History of Recent Intubation  No   Behavior/Cognition  Cooperative;Alert   Oral Cavity Assessment  Within Functional Limits   Oral Care Completed by SLP  No   Oral Cavity - Dentition  Adequate natural dentition   Vision  Functional for self feeding   Self-Feeding Abilities  Able to feed self   Patient Positioning  Upright in chair   Baseline Vocal Quality  Normal   Volitional Cough  Strong   Volitional Swallow  Able to elicit   Anatomy  Within functional limits   Pharyngeal Secretions  Not observed secondary MBS   Oral Preparation/Oral Phase - 12/17/17 1615    Oral Preparation/Oral Phase  Oral Phase  Impaired    Oral - Solids  Oral - Puree  Piecemeal swallowing   Oral - Pill  Delayed A-P transit    Electrical stimulation - Oral Phase  Was Electrical Stimulation Used  No   Pharyngeal Phase - 12/17/17 1618    Pharyngeal Phase  Pharyngeal Phase  Impaired    Pharyngeal - Thin  Pharyngeal- Thin Teaspoon  Swallow initiation at pyriform sinus   Pharyngeal- Thin Cup  Within functional limits   Pharyngeal- Thin Straw  Swallow initiation at pyriform sinus;Penetration/Aspiration during swallow   Pharyngeal  Material enters airway, remains ABOVE vocal cords then ejected out;Material does not enter airway    Pharyngeal - Solids  Pharyngeal- Puree  Swallow initiation at vallecula   Pharyngeal- Regular  Within functional limits   Pharyngeal- Pill  Penetration/Apiration after swallow   Pharyngeal  Material enters airway, passes BELOW cords then ejected out  Pt aspirated trace thin when taking pill   Pharyngeal  Phase - Comment  Pharyngeal Comment  trace aspiration (beneath cords briefly and expelled immediately when taking thin with pill)    Electrical Stimulation - Pharyngeal Phase  Was Electrical Stimulation Used  No   Cricopharyngeal Phase - 12/17/17 1621    Cervical Esophageal Phase  Cervical Esophageal Phase  Within functional limits   Plan - 12/17/17 1623   Clinical Impression Statement  Pt presents with mild oropharyngeal phase dysphagia characterized by mild delay in swallow initiation. Pt had difficulty manipulating barium tablet orally and tablet retained in oral cavity after first swallow of thin. Once pill was swallowed, Pt with aspiration of thin immediately following pill, but material was immediately expelled. When taking straw sips, the swallow was triggered just as thins reached the pyriforms and flash penetration observed (underepiglottic coating) which can be a normal variant. Recommend regular textures with thin liqudis and avoid use of straws for now, take small sips. Pt may experience episodic aspiration events due to recent stroke and occasional delays in swallow initiation, however Pt immediately expelled it today and has a strong cough.    Patient will benefit from skilled therapeutic intervention in order to improve the following  deficits and impairments:  Dysphagia, oropharyngeal phase Recommendations/Treatment - 12/17/17 1621    Swallow Evaluation Recommendations  SLP Diet Recommendations  Age appropriate regular;Thin   Liquid Administration via  Cup  avoid straws  Supervision  Patient able to self feed   Compensations  Slow rate;Small sips/bites   Postural Changes  Seated upright at 90 degrees;Remain upright for at least 30 minutes after feeds/meals   Prognosis - 12/17/17 1622    Prognosis  Prognosis for Safe Diet Advancement  Good    Individuals Consulted  Report Sent to   Facility (Comment)   Problem List Patient Active Problem List  Diagnosis Date Noted . Hypothyroidism 11/29/2017 .  Cerebrovascular accident (CVA) ([place])  . Ischemic stroke ([place])  . Type 2 diabetes mellitus with hyperglycemia ([place]) 11/25/2017 . HLD (hyperlipidemia) 11/25/2017 . Fibromyalgia 11/25/2017 . Depression 11/25/2017 . Tobacco abuse 11/25/2017 . Papule 03/22/2015 . Hypertension  . Acute diverticulitis 11/08/2014 . Diabetes ([place]) 11/08/2014 . Renal insufficiency 11/08/2014 . Abdominal pain 11/08/2014 . Left leg weakness 04/10/2011 . Abnormal gait 04/10/2011 . S/P total knee replacement 12/28/2010 . Knee pain 12/28/2010 Thank you, Genene Churn, [place] [facility] 12/17/2017, 4:29 PM [facility] [address] [place], [place], [address] Phone: [phone]   Fax:  [phone] Name: EZELLE SURPRENANT MRN: [id number] Date of Birth: 1956/02/07 CLINICAL DATA:  62 year old female with recent history of stroke. Dysphagia. EXAM: MODIFIED BARIUM SWALLOW TECHNIQUE: Different consistencies of barium were administered orally to the patient by the Speech Pathologist. Imaging of the pharynx was performed in the lateral projection. FLUOROSCOPY TIME:  Fluoroscopy Time:  1 minutes and 42 seconds. Radiation Exposure Index (if provided by the fluoroscopic device): 13.4 mGy COMPARISON:  None. FINDINGS: Thin liquid-Delayed oral transit. Premature spill over. Delayed swallow trigger. Flash penetration. Pure- Delayed oral transit. Premature spill over. Delayed swallow trigger. Pure with cracker- Delayed oral transit. Premature spill over. Delayed swallow trigger. Barium tablet -  within normal limits IMPRESSION: 1. Abnormal speech study with findings, as above. Electronically Signed   By: Vinnie Langton M.D.   On: 12/17/2017 14:13   US Venous Img Lower Unilateral Right  Result Date: 12/25/2017 CLINICAL DATA:  62 year old with right foot edema after injury 5 days ago. EXAM: RIGHT LOWER EXTREMITY VENOUS DOPPLER ULTRASOUND TECHNIQUE: Gray-scale sonography with graded compression, as well as  color Doppler and duplex ultrasound were performed to evaluate the lower extremity deep venous systems from the level of the common femoral vein and including the common femoral, femoral, profunda femoral, popliteal and calf veins including the posterior tibial, peroneal and gastrocnemius veins when visible. The superficial great saphenous vein was also interrogated. Spectral Doppler was utilized to evaluate flow at rest and with distal augmentation maneuvers in the common femoral, femoral and popliteal veins. COMPARISON:  None. FINDINGS: Contralateral Common Femoral Vein: Respiratory phasicity is normal and symmetric with the symptomatic side. No evidence of thrombus. Normal compressibility. Common Femoral Vein: No evidence of thrombus. Normal compressibility, respiratory phasicity and response to augmentation. Saphenofemoral Junction: No evidence of thrombus. Normal compressibility and flow on color Doppler imaging. Profunda Femoral Vein: No evidence of thrombus. Normal compressibility and flow on color Doppler imaging. Femoral Vein: No evidence of thrombus. Normal compressibility, respiratory phasicity and response to augmentation. Popliteal Vein: No evidence of thrombus. Normal compressibility, respiratory phasicity and response to augmentation. Calf Veins: No evidence of thrombus. Normal compressibility and flow on color Doppler imaging. Superficial Great Saphenous Vein: No evidence of thrombus. Normal compressibility. Venous Reflux:  None. Other Findings:  None. IMPRESSION: Negative for deep venous thrombosis in right lower extremity. Electronically Signed   By: Markus Daft M.D.   On: 12/25/2017 15:14    Assessment/Plan:   S/P Cerebrovascular accident (CVA) She was started on Aspirin No Source of emboli was found  But due to signs of old infarct on MRI neurology recommended 30-day cardiac monitor Which was done in the facility. Secondary prevention with Lipitor  Type 2 diabetes mellitus Patient did  well on Levemir 30 units every morning Her blood sugars were anywhere between 100 - 180 Continued on metformin Her A1c was 9.5 in the hospital Goal is less than 7 Continue Accu check BID at home. To inform her PCP if it is less then 60 or more then 300. Follow up with PCP  Husband was taught how to take care of her insulin at home  Essential hypertension Blood pressure stable. Continue on her home dose of amlodipine and benazepril  Hyperlipidemia LDL was 121 in the hospital Started on Lipitor  Fibromyalgia Continue on Lexapro  Depression,  Continue on Lexapro Renal insufficiency Creatinine at baseline  Hypothyroidism New diagnosis in the hospital with high TSH Started on Synthroid Will need Repeat TSH as outpatient  Discharge Planning Patient going home with her husband with follow up with Neurology and  her PCP. Also will be followed by Home PT/OT/Nurse Her equipment Wheelchair and Hospital Bed was provided.   Future labs/tests needed:    Discharge Planning more then 30 min.

## 2018-01-02 DIAGNOSIS — E782 Mixed hyperlipidemia: Secondary | ICD-10-CM | POA: Diagnosis not present

## 2018-01-02 DIAGNOSIS — Z8673 Personal history of transient ischemic attack (TIA), and cerebral infarction without residual deficits: Secondary | ICD-10-CM | POA: Diagnosis not present

## 2018-01-02 DIAGNOSIS — I10 Essential (primary) hypertension: Secondary | ICD-10-CM | POA: Diagnosis not present

## 2018-01-02 DIAGNOSIS — E1165 Type 2 diabetes mellitus with hyperglycemia: Secondary | ICD-10-CM | POA: Diagnosis not present

## 2018-01-02 DIAGNOSIS — R69 Illness, unspecified: Secondary | ICD-10-CM | POA: Diagnosis not present

## 2018-01-02 DIAGNOSIS — R946 Abnormal results of thyroid function studies: Secondary | ICD-10-CM | POA: Diagnosis not present

## 2018-01-02 DIAGNOSIS — Z6831 Body mass index (BMI) 31.0-31.9, adult: Secondary | ICD-10-CM | POA: Diagnosis not present

## 2018-01-03 DIAGNOSIS — I69322 Dysarthria following cerebral infarction: Secondary | ICD-10-CM | POA: Diagnosis not present

## 2018-01-03 DIAGNOSIS — R69 Illness, unspecified: Secondary | ICD-10-CM | POA: Diagnosis not present

## 2018-01-03 DIAGNOSIS — E119 Type 2 diabetes mellitus without complications: Secondary | ICD-10-CM | POA: Diagnosis not present

## 2018-01-03 DIAGNOSIS — R1310 Dysphagia, unspecified: Secondary | ICD-10-CM | POA: Diagnosis not present

## 2018-01-03 DIAGNOSIS — M797 Fibromyalgia: Secondary | ICD-10-CM | POA: Diagnosis not present

## 2018-01-03 DIAGNOSIS — I69392 Facial weakness following cerebral infarction: Secondary | ICD-10-CM | POA: Diagnosis not present

## 2018-01-03 DIAGNOSIS — I69391 Dysphagia following cerebral infarction: Secondary | ICD-10-CM | POA: Diagnosis not present

## 2018-01-03 DIAGNOSIS — I69351 Hemiplegia and hemiparesis following cerebral infarction affecting right dominant side: Secondary | ICD-10-CM | POA: Diagnosis not present

## 2018-01-03 DIAGNOSIS — I10 Essential (primary) hypertension: Secondary | ICD-10-CM | POA: Diagnosis not present

## 2018-01-03 DIAGNOSIS — K5790 Diverticulosis of intestine, part unspecified, without perforation or abscess without bleeding: Secondary | ICD-10-CM | POA: Diagnosis not present

## 2018-01-04 DIAGNOSIS — I69391 Dysphagia following cerebral infarction: Secondary | ICD-10-CM | POA: Diagnosis not present

## 2018-01-04 DIAGNOSIS — K5790 Diverticulosis of intestine, part unspecified, without perforation or abscess without bleeding: Secondary | ICD-10-CM | POA: Diagnosis not present

## 2018-01-04 DIAGNOSIS — R1310 Dysphagia, unspecified: Secondary | ICD-10-CM | POA: Diagnosis not present

## 2018-01-04 DIAGNOSIS — I10 Essential (primary) hypertension: Secondary | ICD-10-CM | POA: Diagnosis not present

## 2018-01-04 DIAGNOSIS — I69322 Dysarthria following cerebral infarction: Secondary | ICD-10-CM | POA: Diagnosis not present

## 2018-01-04 DIAGNOSIS — E119 Type 2 diabetes mellitus without complications: Secondary | ICD-10-CM | POA: Diagnosis not present

## 2018-01-04 DIAGNOSIS — I69392 Facial weakness following cerebral infarction: Secondary | ICD-10-CM | POA: Diagnosis not present

## 2018-01-04 DIAGNOSIS — M797 Fibromyalgia: Secondary | ICD-10-CM | POA: Diagnosis not present

## 2018-01-04 DIAGNOSIS — R69 Illness, unspecified: Secondary | ICD-10-CM | POA: Diagnosis not present

## 2018-01-04 DIAGNOSIS — I69351 Hemiplegia and hemiparesis following cerebral infarction affecting right dominant side: Secondary | ICD-10-CM | POA: Diagnosis not present

## 2018-01-07 DIAGNOSIS — R69 Illness, unspecified: Secondary | ICD-10-CM | POA: Diagnosis not present

## 2018-01-07 DIAGNOSIS — I10 Essential (primary) hypertension: Secondary | ICD-10-CM | POA: Diagnosis not present

## 2018-01-07 DIAGNOSIS — R1310 Dysphagia, unspecified: Secondary | ICD-10-CM | POA: Diagnosis not present

## 2018-01-07 DIAGNOSIS — I69391 Dysphagia following cerebral infarction: Secondary | ICD-10-CM | POA: Diagnosis not present

## 2018-01-07 DIAGNOSIS — K5790 Diverticulosis of intestine, part unspecified, without perforation or abscess without bleeding: Secondary | ICD-10-CM | POA: Diagnosis not present

## 2018-01-07 DIAGNOSIS — M797 Fibromyalgia: Secondary | ICD-10-CM | POA: Diagnosis not present

## 2018-01-07 DIAGNOSIS — I69392 Facial weakness following cerebral infarction: Secondary | ICD-10-CM | POA: Diagnosis not present

## 2018-01-07 DIAGNOSIS — E119 Type 2 diabetes mellitus without complications: Secondary | ICD-10-CM | POA: Diagnosis not present

## 2018-01-07 DIAGNOSIS — I69322 Dysarthria following cerebral infarction: Secondary | ICD-10-CM | POA: Diagnosis not present

## 2018-01-07 DIAGNOSIS — I69351 Hemiplegia and hemiparesis following cerebral infarction affecting right dominant side: Secondary | ICD-10-CM | POA: Diagnosis not present

## 2018-01-08 DIAGNOSIS — I639 Cerebral infarction, unspecified: Secondary | ICD-10-CM | POA: Diagnosis not present

## 2018-01-08 DIAGNOSIS — I69351 Hemiplegia and hemiparesis following cerebral infarction affecting right dominant side: Secondary | ICD-10-CM | POA: Diagnosis not present

## 2018-01-08 DIAGNOSIS — R69 Illness, unspecified: Secondary | ICD-10-CM | POA: Diagnosis not present

## 2018-01-08 DIAGNOSIS — I69322 Dysarthria following cerebral infarction: Secondary | ICD-10-CM | POA: Diagnosis not present

## 2018-01-08 DIAGNOSIS — E119 Type 2 diabetes mellitus without complications: Secondary | ICD-10-CM | POA: Diagnosis not present

## 2018-01-08 DIAGNOSIS — I69392 Facial weakness following cerebral infarction: Secondary | ICD-10-CM | POA: Diagnosis not present

## 2018-01-08 DIAGNOSIS — R269 Unspecified abnormalities of gait and mobility: Secondary | ICD-10-CM | POA: Diagnosis not present

## 2018-01-08 DIAGNOSIS — G8191 Hemiplegia, unspecified affecting right dominant side: Secondary | ICD-10-CM | POA: Diagnosis not present

## 2018-01-08 DIAGNOSIS — R1310 Dysphagia, unspecified: Secondary | ICD-10-CM | POA: Diagnosis not present

## 2018-01-08 DIAGNOSIS — I69391 Dysphagia following cerebral infarction: Secondary | ICD-10-CM | POA: Diagnosis not present

## 2018-01-08 DIAGNOSIS — K5790 Diverticulosis of intestine, part unspecified, without perforation or abscess without bleeding: Secondary | ICD-10-CM | POA: Diagnosis not present

## 2018-01-08 DIAGNOSIS — I10 Essential (primary) hypertension: Secondary | ICD-10-CM | POA: Diagnosis not present

## 2018-01-08 DIAGNOSIS — M797 Fibromyalgia: Secondary | ICD-10-CM | POA: Diagnosis not present

## 2018-01-09 DIAGNOSIS — I69351 Hemiplegia and hemiparesis following cerebral infarction affecting right dominant side: Secondary | ICD-10-CM | POA: Diagnosis not present

## 2018-01-09 DIAGNOSIS — I10 Essential (primary) hypertension: Secondary | ICD-10-CM | POA: Diagnosis not present

## 2018-01-09 DIAGNOSIS — I69391 Dysphagia following cerebral infarction: Secondary | ICD-10-CM | POA: Diagnosis not present

## 2018-01-09 DIAGNOSIS — I69392 Facial weakness following cerebral infarction: Secondary | ICD-10-CM | POA: Diagnosis not present

## 2018-01-09 DIAGNOSIS — M797 Fibromyalgia: Secondary | ICD-10-CM | POA: Diagnosis not present

## 2018-01-09 DIAGNOSIS — I69322 Dysarthria following cerebral infarction: Secondary | ICD-10-CM | POA: Diagnosis not present

## 2018-01-09 DIAGNOSIS — E119 Type 2 diabetes mellitus without complications: Secondary | ICD-10-CM | POA: Diagnosis not present

## 2018-01-09 DIAGNOSIS — K5790 Diverticulosis of intestine, part unspecified, without perforation or abscess without bleeding: Secondary | ICD-10-CM | POA: Diagnosis not present

## 2018-01-09 DIAGNOSIS — R1310 Dysphagia, unspecified: Secondary | ICD-10-CM | POA: Diagnosis not present

## 2018-01-09 DIAGNOSIS — R69 Illness, unspecified: Secondary | ICD-10-CM | POA: Diagnosis not present

## 2018-01-10 DIAGNOSIS — R1310 Dysphagia, unspecified: Secondary | ICD-10-CM | POA: Diagnosis not present

## 2018-01-10 DIAGNOSIS — I69392 Facial weakness following cerebral infarction: Secondary | ICD-10-CM | POA: Diagnosis not present

## 2018-01-10 DIAGNOSIS — I69391 Dysphagia following cerebral infarction: Secondary | ICD-10-CM | POA: Diagnosis not present

## 2018-01-10 DIAGNOSIS — I69322 Dysarthria following cerebral infarction: Secondary | ICD-10-CM | POA: Diagnosis not present

## 2018-01-10 DIAGNOSIS — M797 Fibromyalgia: Secondary | ICD-10-CM | POA: Diagnosis not present

## 2018-01-10 DIAGNOSIS — I10 Essential (primary) hypertension: Secondary | ICD-10-CM | POA: Diagnosis not present

## 2018-01-10 DIAGNOSIS — E119 Type 2 diabetes mellitus without complications: Secondary | ICD-10-CM | POA: Diagnosis not present

## 2018-01-10 DIAGNOSIS — R69 Illness, unspecified: Secondary | ICD-10-CM | POA: Diagnosis not present

## 2018-01-10 DIAGNOSIS — I69351 Hemiplegia and hemiparesis following cerebral infarction affecting right dominant side: Secondary | ICD-10-CM | POA: Diagnosis not present

## 2018-01-10 DIAGNOSIS — Z6831 Body mass index (BMI) 31.0-31.9, adult: Secondary | ICD-10-CM | POA: Diagnosis not present

## 2018-01-10 DIAGNOSIS — K5790 Diverticulosis of intestine, part unspecified, without perforation or abscess without bleeding: Secondary | ICD-10-CM | POA: Diagnosis not present

## 2018-01-11 DIAGNOSIS — R69 Illness, unspecified: Secondary | ICD-10-CM | POA: Diagnosis not present

## 2018-01-11 DIAGNOSIS — K5790 Diverticulosis of intestine, part unspecified, without perforation or abscess without bleeding: Secondary | ICD-10-CM | POA: Diagnosis not present

## 2018-01-11 DIAGNOSIS — I69322 Dysarthria following cerebral infarction: Secondary | ICD-10-CM | POA: Diagnosis not present

## 2018-01-11 DIAGNOSIS — I69392 Facial weakness following cerebral infarction: Secondary | ICD-10-CM | POA: Diagnosis not present

## 2018-01-11 DIAGNOSIS — I69351 Hemiplegia and hemiparesis following cerebral infarction affecting right dominant side: Secondary | ICD-10-CM | POA: Diagnosis not present

## 2018-01-11 DIAGNOSIS — I10 Essential (primary) hypertension: Secondary | ICD-10-CM | POA: Diagnosis not present

## 2018-01-11 DIAGNOSIS — M797 Fibromyalgia: Secondary | ICD-10-CM | POA: Diagnosis not present

## 2018-01-11 DIAGNOSIS — R1310 Dysphagia, unspecified: Secondary | ICD-10-CM | POA: Diagnosis not present

## 2018-01-11 DIAGNOSIS — I69391 Dysphagia following cerebral infarction: Secondary | ICD-10-CM | POA: Diagnosis not present

## 2018-01-11 DIAGNOSIS — E119 Type 2 diabetes mellitus without complications: Secondary | ICD-10-CM | POA: Diagnosis not present

## 2018-01-15 DIAGNOSIS — E119 Type 2 diabetes mellitus without complications: Secondary | ICD-10-CM | POA: Diagnosis not present

## 2018-01-15 DIAGNOSIS — I10 Essential (primary) hypertension: Secondary | ICD-10-CM | POA: Diagnosis not present

## 2018-01-15 DIAGNOSIS — R1310 Dysphagia, unspecified: Secondary | ICD-10-CM | POA: Diagnosis not present

## 2018-01-15 DIAGNOSIS — I69322 Dysarthria following cerebral infarction: Secondary | ICD-10-CM | POA: Diagnosis not present

## 2018-01-15 DIAGNOSIS — I69351 Hemiplegia and hemiparesis following cerebral infarction affecting right dominant side: Secondary | ICD-10-CM | POA: Diagnosis not present

## 2018-01-15 DIAGNOSIS — I69392 Facial weakness following cerebral infarction: Secondary | ICD-10-CM | POA: Diagnosis not present

## 2018-01-15 DIAGNOSIS — I69391 Dysphagia following cerebral infarction: Secondary | ICD-10-CM | POA: Diagnosis not present

## 2018-01-15 DIAGNOSIS — K5790 Diverticulosis of intestine, part unspecified, without perforation or abscess without bleeding: Secondary | ICD-10-CM | POA: Diagnosis not present

## 2018-01-15 DIAGNOSIS — R69 Illness, unspecified: Secondary | ICD-10-CM | POA: Diagnosis not present

## 2018-01-15 DIAGNOSIS — M797 Fibromyalgia: Secondary | ICD-10-CM | POA: Diagnosis not present

## 2018-01-16 DIAGNOSIS — R69 Illness, unspecified: Secondary | ICD-10-CM | POA: Diagnosis not present

## 2018-01-16 DIAGNOSIS — I10 Essential (primary) hypertension: Secondary | ICD-10-CM | POA: Diagnosis not present

## 2018-01-16 DIAGNOSIS — Z23 Encounter for immunization: Secondary | ICD-10-CM | POA: Diagnosis not present

## 2018-01-18 DIAGNOSIS — I69322 Dysarthria following cerebral infarction: Secondary | ICD-10-CM | POA: Diagnosis not present

## 2018-01-18 DIAGNOSIS — R1310 Dysphagia, unspecified: Secondary | ICD-10-CM | POA: Diagnosis not present

## 2018-01-18 DIAGNOSIS — I69392 Facial weakness following cerebral infarction: Secondary | ICD-10-CM | POA: Diagnosis not present

## 2018-01-18 DIAGNOSIS — M797 Fibromyalgia: Secondary | ICD-10-CM | POA: Diagnosis not present

## 2018-01-18 DIAGNOSIS — I10 Essential (primary) hypertension: Secondary | ICD-10-CM | POA: Diagnosis not present

## 2018-01-18 DIAGNOSIS — E119 Type 2 diabetes mellitus without complications: Secondary | ICD-10-CM | POA: Diagnosis not present

## 2018-01-18 DIAGNOSIS — R69 Illness, unspecified: Secondary | ICD-10-CM | POA: Diagnosis not present

## 2018-01-18 DIAGNOSIS — I69391 Dysphagia following cerebral infarction: Secondary | ICD-10-CM | POA: Diagnosis not present

## 2018-01-18 DIAGNOSIS — K5790 Diverticulosis of intestine, part unspecified, without perforation or abscess without bleeding: Secondary | ICD-10-CM | POA: Diagnosis not present

## 2018-01-18 DIAGNOSIS — I69351 Hemiplegia and hemiparesis following cerebral infarction affecting right dominant side: Secondary | ICD-10-CM | POA: Diagnosis not present

## 2018-01-21 DIAGNOSIS — M797 Fibromyalgia: Secondary | ICD-10-CM | POA: Diagnosis not present

## 2018-01-21 DIAGNOSIS — I10 Essential (primary) hypertension: Secondary | ICD-10-CM | POA: Diagnosis not present

## 2018-01-21 DIAGNOSIS — I69392 Facial weakness following cerebral infarction: Secondary | ICD-10-CM | POA: Diagnosis not present

## 2018-01-21 DIAGNOSIS — E119 Type 2 diabetes mellitus without complications: Secondary | ICD-10-CM | POA: Diagnosis not present

## 2018-01-21 DIAGNOSIS — K5790 Diverticulosis of intestine, part unspecified, without perforation or abscess without bleeding: Secondary | ICD-10-CM | POA: Diagnosis not present

## 2018-01-21 DIAGNOSIS — I69322 Dysarthria following cerebral infarction: Secondary | ICD-10-CM | POA: Diagnosis not present

## 2018-01-21 DIAGNOSIS — R69 Illness, unspecified: Secondary | ICD-10-CM | POA: Diagnosis not present

## 2018-01-21 DIAGNOSIS — I69391 Dysphagia following cerebral infarction: Secondary | ICD-10-CM | POA: Diagnosis not present

## 2018-01-21 DIAGNOSIS — I69351 Hemiplegia and hemiparesis following cerebral infarction affecting right dominant side: Secondary | ICD-10-CM | POA: Diagnosis not present

## 2018-01-21 DIAGNOSIS — R1310 Dysphagia, unspecified: Secondary | ICD-10-CM | POA: Diagnosis not present

## 2018-01-22 DIAGNOSIS — I69322 Dysarthria following cerebral infarction: Secondary | ICD-10-CM | POA: Diagnosis not present

## 2018-01-22 DIAGNOSIS — E119 Type 2 diabetes mellitus without complications: Secondary | ICD-10-CM | POA: Diagnosis not present

## 2018-01-22 DIAGNOSIS — I69391 Dysphagia following cerebral infarction: Secondary | ICD-10-CM | POA: Diagnosis not present

## 2018-01-22 DIAGNOSIS — R1310 Dysphagia, unspecified: Secondary | ICD-10-CM | POA: Diagnosis not present

## 2018-01-22 DIAGNOSIS — I69351 Hemiplegia and hemiparesis following cerebral infarction affecting right dominant side: Secondary | ICD-10-CM | POA: Diagnosis not present

## 2018-01-22 DIAGNOSIS — K5790 Diverticulosis of intestine, part unspecified, without perforation or abscess without bleeding: Secondary | ICD-10-CM | POA: Diagnosis not present

## 2018-01-22 DIAGNOSIS — I69392 Facial weakness following cerebral infarction: Secondary | ICD-10-CM | POA: Diagnosis not present

## 2018-01-22 DIAGNOSIS — R69 Illness, unspecified: Secondary | ICD-10-CM | POA: Diagnosis not present

## 2018-01-22 DIAGNOSIS — M797 Fibromyalgia: Secondary | ICD-10-CM | POA: Diagnosis not present

## 2018-01-22 DIAGNOSIS — I10 Essential (primary) hypertension: Secondary | ICD-10-CM | POA: Diagnosis not present

## 2018-01-29 DIAGNOSIS — I69392 Facial weakness following cerebral infarction: Secondary | ICD-10-CM | POA: Diagnosis not present

## 2018-01-29 DIAGNOSIS — M797 Fibromyalgia: Secondary | ICD-10-CM | POA: Diagnosis not present

## 2018-01-29 DIAGNOSIS — I69351 Hemiplegia and hemiparesis following cerebral infarction affecting right dominant side: Secondary | ICD-10-CM | POA: Diagnosis not present

## 2018-01-29 DIAGNOSIS — R1310 Dysphagia, unspecified: Secondary | ICD-10-CM | POA: Diagnosis not present

## 2018-01-29 DIAGNOSIS — R69 Illness, unspecified: Secondary | ICD-10-CM | POA: Diagnosis not present

## 2018-01-29 DIAGNOSIS — I10 Essential (primary) hypertension: Secondary | ICD-10-CM | POA: Diagnosis not present

## 2018-01-29 DIAGNOSIS — E119 Type 2 diabetes mellitus without complications: Secondary | ICD-10-CM | POA: Diagnosis not present

## 2018-01-29 DIAGNOSIS — I69391 Dysphagia following cerebral infarction: Secondary | ICD-10-CM | POA: Diagnosis not present

## 2018-01-29 DIAGNOSIS — K5790 Diverticulosis of intestine, part unspecified, without perforation or abscess without bleeding: Secondary | ICD-10-CM | POA: Diagnosis not present

## 2018-01-29 DIAGNOSIS — I69322 Dysarthria following cerebral infarction: Secondary | ICD-10-CM | POA: Diagnosis not present

## 2018-01-30 DIAGNOSIS — I10 Essential (primary) hypertension: Secondary | ICD-10-CM | POA: Diagnosis not present

## 2018-01-30 DIAGNOSIS — I69351 Hemiplegia and hemiparesis following cerebral infarction affecting right dominant side: Secondary | ICD-10-CM | POA: Diagnosis not present

## 2018-01-30 DIAGNOSIS — I69322 Dysarthria following cerebral infarction: Secondary | ICD-10-CM | POA: Diagnosis not present

## 2018-01-30 DIAGNOSIS — M797 Fibromyalgia: Secondary | ICD-10-CM | POA: Diagnosis not present

## 2018-01-30 DIAGNOSIS — R69 Illness, unspecified: Secondary | ICD-10-CM | POA: Diagnosis not present

## 2018-01-30 DIAGNOSIS — E119 Type 2 diabetes mellitus without complications: Secondary | ICD-10-CM | POA: Diagnosis not present

## 2018-01-30 DIAGNOSIS — I69392 Facial weakness following cerebral infarction: Secondary | ICD-10-CM | POA: Diagnosis not present

## 2018-01-30 DIAGNOSIS — I69391 Dysphagia following cerebral infarction: Secondary | ICD-10-CM | POA: Diagnosis not present

## 2018-01-30 DIAGNOSIS — R1310 Dysphagia, unspecified: Secondary | ICD-10-CM | POA: Diagnosis not present

## 2018-01-30 DIAGNOSIS — K5790 Diverticulosis of intestine, part unspecified, without perforation or abscess without bleeding: Secondary | ICD-10-CM | POA: Diagnosis not present

## 2018-01-31 DIAGNOSIS — I639 Cerebral infarction, unspecified: Secondary | ICD-10-CM | POA: Diagnosis not present

## 2018-02-04 DIAGNOSIS — R1310 Dysphagia, unspecified: Secondary | ICD-10-CM | POA: Diagnosis not present

## 2018-02-04 DIAGNOSIS — R69 Illness, unspecified: Secondary | ICD-10-CM | POA: Diagnosis not present

## 2018-02-04 DIAGNOSIS — I69322 Dysarthria following cerebral infarction: Secondary | ICD-10-CM | POA: Diagnosis not present

## 2018-02-04 DIAGNOSIS — I10 Essential (primary) hypertension: Secondary | ICD-10-CM | POA: Diagnosis not present

## 2018-02-04 DIAGNOSIS — E119 Type 2 diabetes mellitus without complications: Secondary | ICD-10-CM | POA: Diagnosis not present

## 2018-02-04 DIAGNOSIS — I69392 Facial weakness following cerebral infarction: Secondary | ICD-10-CM | POA: Diagnosis not present

## 2018-02-04 DIAGNOSIS — I69391 Dysphagia following cerebral infarction: Secondary | ICD-10-CM | POA: Diagnosis not present

## 2018-02-04 DIAGNOSIS — I69351 Hemiplegia and hemiparesis following cerebral infarction affecting right dominant side: Secondary | ICD-10-CM | POA: Diagnosis not present

## 2018-02-04 DIAGNOSIS — K5790 Diverticulosis of intestine, part unspecified, without perforation or abscess without bleeding: Secondary | ICD-10-CM | POA: Diagnosis not present

## 2018-02-04 DIAGNOSIS — M797 Fibromyalgia: Secondary | ICD-10-CM | POA: Diagnosis not present

## 2018-02-05 ENCOUNTER — Encounter: Payer: Self-pay | Admitting: Neurology

## 2018-02-05 ENCOUNTER — Ambulatory Visit: Payer: Medicare HMO | Admitting: Neurology

## 2018-02-05 VITALS — BP 155/85 | HR 112 | Ht 63.0 in

## 2018-02-05 DIAGNOSIS — G8110 Spastic hemiplegia affecting unspecified side: Secondary | ICD-10-CM | POA: Diagnosis not present

## 2018-02-05 DIAGNOSIS — I639 Cerebral infarction, unspecified: Secondary | ICD-10-CM

## 2018-02-05 NOTE — Patient Instructions (Signed)
I had a long d/w patient and her neice about her recent  cryptogenic stroke,spastic hemiplegia, risk for recurrent stroke/TIAs, personally independently reviewed imaging studies and stroke evaluation results and answered questions.Continue aspirin 325 mg daily  for secondary stroke prevention and maintain strict control of hypertension with blood pressure goal below 130/90, diabetes with hemoglobin A1c goal below 6.5% and lipids with LDL cholesterol goal below 70 mg/dL. I also advised the patient to eat a healthy diet with plenty of whole grains, cereals, fruits and vegetables, exercise regularly and maintain ideal body weight .  She was advised to continue ongoing home physical therapy and then transition to outpatient physical and occupational therapy for gait and balance training.  She may also consider possible participation in the [place] stroke trial if interested.  My nurse practitioner Shanda Bumps in 3 months or call earlier if necessary   Stroke Prevention Some medical conditions and behaviors are associated with a higher chance of having a stroke. You can help prevent a stroke by making nutrition, lifestyle, and other changes, including managing any medical conditions you may have. What nutrition changes can be made?  Eat healthy foods. You can do this by: ? Choosing foods high in fiber, such as fresh fruits and vegetables and whole grains. ? Eating at least 5 or more servings of fruits and vegetables a day. Try to fill half of your plate at each meal with fruits and vegetables. ? Choosing lean protein foods, such as lean cuts of meat, poultry without skin, fish, tofu, beans, and nuts. ? Eating low-fat dairy products. ? Avoiding foods that are high in salt (sodium). This can help lower blood pressure. ? Avoiding foods that have saturated fat, trans fat, and cholesterol. This can help prevent high cholesterol. ? Avoiding processed and premade foods.  Follow your health care provider's specific  guidelines for losing weight, controlling high blood pressure (hypertension), lowering high cholesterol, and managing diabetes. These may include: ? Reducing your daily calorie intake. ? Limiting your daily sodium intake to 1,500 milligrams (mg). ? Using only healthy fats for cooking, such as olive oil, canola oil, or sunflower oil. ? Counting your daily carbohydrate intake. What lifestyle changes can be made?  Maintain a healthy weight. Talk to your health care provider about your ideal weight.  Get at least 30 minutes of moderate physical activity at least 5 days a week. Moderate activity includes brisk walking, biking, and swimming.  Do not use any products that contain nicotine or tobacco, such as cigarettes and e-cigarettes. If you need help quitting, ask your health care provider. It may also be helpful to avoid exposure to secondhand smoke.  Limit alcohol intake to no more than 1 drink a day for nonpregnant women and 2 drinks a day for men. One drink equals 12 oz of beer, 5 oz of wine, or 1 oz of hard liquor.  Stop any illegal drug use.  Avoid taking birth control pills. Talk to your health care provider about the risks of taking birth control pills if: ? You are over 55 years old. ? You smoke. ? You get migraines. ? You have ever had a blood clot. What other changes can be made?  Manage your cholesterol levels. ? Eating a healthy diet is important for preventing high cholesterol. If cholesterol cannot be managed through diet alone, you may also need to take medicines. ? Take any prescribed medicines to control your cholesterol as told by your health care provider.  Manage your diabetes. ? Eating  a healthy diet and exercising regularly are important parts of managing your blood sugar. If your blood sugar cannot be managed through diet and exercise, you may need to take medicines. ? Take any prescribed medicines to control your diabetes as told by your health care  provider.  Control your hypertension. ? To reduce your risk of stroke, try to keep your blood pressure below 130/80. ? Eating a healthy diet and exercising regularly are an important part of controlling your blood pressure. If your blood pressure cannot be managed through diet and exercise, you may need to take medicines. ? Take any prescribed medicines to control hypertension as told by your health care provider. ? Ask your health care provider if you should monitor your blood pressure at home. ? Have your blood pressure checked every year, even if your blood pressure is normal. Blood pressure increases with age and some medical conditions.  Get evaluated for sleep disorders (sleep apnea). Talk to your health care provider about getting a sleep evaluation if you snore a lot or have excessive sleepiness.  Take over-the-counter and prescription medicines only as told by your health care provider. Aspirin or blood thinners (antiplatelets or anticoagulants) may be recommended to reduce your risk of forming blood clots that can lead to stroke.  Make sure that any other medical conditions you have, such as atrial fibrillation or atherosclerosis, are managed. What are the warning signs of a stroke? The warning signs of a stroke can be easily remembered as BEFAST.  B is for balance. Signs include: ? Dizziness. ? Loss of balance or coordination. ? Sudden trouble walking.  E is for eyes. Signs include: ? A sudden change in vision. ? Trouble seeing.  F is for face. Signs include: ? Sudden weakness or numbness of the face. ? The face or eyelid drooping to one side.  A is for arms. Signs include: ? Sudden weakness or numbness of the arm, usually on one side of the body.  S is for speech. Signs include: ? Trouble speaking (aphasia). ? Trouble understanding.  T is for time. ? These symptoms may represent a serious problem that is an emergency. Do not wait to see if the symptoms will go away.  Get medical help right away. Call your local emergency services (911 in the [place] Do not drive yourself to the hospital.  Other signs of stroke may include: ? A sudden, severe headache with no known cause. ? Nausea or vomiting. ? Seizure.  Where to find more information: For more information, visit:  American Stroke Association: www.strokeassociation.org  National Stroke Association: www.stroke.org  Summary  You can prevent a stroke by eating healthy, exercising, not smoking, limiting alcohol intake, and managing any medical conditions you may have.  Do not use any products that contain nicotine or tobacco, such as cigarettes and e-cigarettes. If you need help quitting, ask your health care provider. It may also be helpful to avoid exposure to secondhand smoke.  Remember BEFAST for warning signs of stroke. Get help right away if you or a loved one has any of these signs. This information is not intended to replace advice given to you by your health care provider. Make sure you discuss any questions you have with your health care provider. Document Released: 05/25/2004 Document Revised: 05/23/2016 Document Reviewed: 05/23/2016 Elsevier Interactive Patient Education  [employer].

## 2018-02-05 NOTE — Progress Notes (Signed)
[facility] [address] [place] [address] [phone]       OFFICE CONSULT NOTE  [name] Date of Birth:  Mar 23, 1956 Medical Record Number:  [id number]   Referring MD: Jannette Fogo, NP Reason for Referral: Stroke HPI: [name] is a pleasant 62 year old Caucasian lady who was seen today for initial office consultation visit.  She is accompanied by her niece [name].  History is obtained from the patient and review of electronic medical records.  I personally reviewed imaging films.  She presented to [facility] on 11/25/2017 with worsening of right-sided weakness.  Her symptoms actually began 2 weeks ago when she had some slurred speech and weakness but she did not seek medical help.  2 weeks later when her symptoms worsen she presented to the ER.  She was seen by telemetry neurologist and not felt to be candidate for TPA.  NIH stroke scale on admission was 10.  CT scan of the head showed possible subacute left subcortical white matter infarct.  Basic admission labs were unremarkable.  MRI scan of the brain personally reviewed by me shows a large 2 cm left basal ganglia subcortical infarct without hemorrhagic transformation.  There is a remote age lacunar infarct noted in the right basal ganglia as well.  Carotid ultrasound showed mild plaque but no significant extracranial stenosis.  MRA of the brain showed mild atheromatous irregularities in bilateral P2 segments of posterior cerebral arteries only.  LDL cholesterol was elevated at 121 mg percent and hemoglobin A1c was 9.5.  Transthoracic echo showed normal ejection fraction.  Patient subsequently had outpatient 30-day Holter monitor which did not reveal any cardiac arrhythmias.  Patient was transferred from [facility] to [facility] for inpatient rehab for about 3 to 4 weeks.  A wheelchair ran over her right foot causing some fractures.  This possibly affect her rehab potential.  She is  still at home now but getting home therapy but is yet unable to walk even with 2% therapist assist she can barely stand.  Patient speech has improved though she is has occasionally slurs on few words.  She is able to move the right arm and leg but does not have much purposeful use yet.  She is tolerating aspirin well without bleeding or bruising.  Her blood pressure seems to be under better control now today it is still elevated in office in 1 nine 5/83.  She has finished home speech therapy and is still getting physical and occupational therapy only.  ROS:   14 system review of systems is positive for weakness, speech difficulties, difficulty walking and all other systems negative  PMH:  Past Medical History:  Diagnosis Date  . Diabetes mellitus without complication ([place])   . Diverticulitis   . Fibromyalgia   . Hyperlipidemia   . Hypertension   . Papule 03/22/2015  . Stroke [facility])     Social History:  Social History   Socioeconomic History  . Marital status: Married    Spouse name: Not on file  . Number of children: Not on file  . Years of education: Not on file  . Highest education level: Not on file  Occupational History  . Not on file  Social Needs  . Financial resource strain: Not on file  . Food insecurity:    Worry: Not on file    Inability: Not on file  . Transportation needs:    Medical: Not on file    Non-medical: Not  on file  Tobacco Use  . Smoking status: Former Smoker    Packs/day: 1.50    Years: 38.00    Pack years: 57.00    Types: Cigarettes    Last attempt to quit: 03/26/2001    Years since quitting: 16.8  . Smokeless tobacco: Never Used  Substance and Sexual Activity  . Alcohol use: Yes    Alcohol/week: 1.0 standard drinks    Types: 1 Glasses of wine per week    Comment: occ glass of wine  . Drug use: No  . Sexual activity: Never    Birth control/protection: Post-menopausal  Lifestyle  . Physical activity:    Days per week: Not on file     Minutes per session: Not on file  . Stress: Not on file  Relationships  . Social connections:    Talks on phone: Not on file    Gets together: Not on file    Attends religious service: Not on file    Active member of club or organization: Not on file    Attends meetings of clubs or organizations: Not on file    Relationship status: Not on file  . Intimate partner violence:    Fear of current or ex partner: Not on file    Emotionally abused: Not on file    Physically abused: Not on file    Forced sexual activity: Not on file  Other Topics Concern  . Not on file  Social History Narrative  . Not on file    Medications:   Current Outpatient Medications on File Prior to Visit  Medication Sig Dispense Refill  . aspirin 325 MG tablet Take 1 tablet (325 mg total) by mouth daily.    . Blood Glucose Monitoring Suppl (ONETOUCH VERIO) w/Device KIT USE TO CHECK GLUCOSE THREE TIMES DAILY  0  . diclofenac sodium (VOLTAREN) 1 % GEL Apply 3 g to 3 large joints up to 3 times a day when necessary;  dispense 10 tubes with 1 refill 10 Tube 1  . escitalopram (LEXAPRO) 10 MG tablet Take 10 mg by mouth daily.   4  . fenofibrate 160 MG tablet Take 160 mg by mouth daily.   1  . insulin detemir (LEVEMIR) 100 UNIT/ML injection Inject 0.3 mLs (30 Units total) into the skin daily. (Patient taking differently: Inject 25 Units into the skin daily. )    . metFORMIN (GLUCOPHAGE) 1000 MG tablet Take 1 tablet (1,000 mg total) by mouth 2 (two) times daily with a meal.    . omega-3 acid ethyl esters (LOVAZA) 1 g capsule Take 1 capsule (1 g total) by mouth 2 (two) times daily.    Glory Rosebush VERIO test strip USE 1 STRIP TO CHECK GLUCOSE THREE TIMES DAILY  3  . levothyroxine (SYNTHROID, LEVOTHROID) 50 MCG tablet Take 1 tablet (50 mcg total) by mouth daily before breakfast. (Patient not taking: Reported on 02/05/2018)     No current facility-administered medications on file prior to visit.     Allergies:   Allergies    Allergen Reactions  . Bee Pollen Anaphylaxis and Swelling  . Bee Venom   . Codeine     REACTION: nauseated,claustrophobic    Physical Exam General: Frail middle-aged Caucasian lady, seated, in no evident distress Head: head normocephalic and atraumatic.   Neck: supple with no carotid or supraclavicular bruits Cardiovascular: regular rate and rhythm, no murmurs Musculoskeletal: no deformity Skin:  no rash/petichiae Vascular:  Normal pulses all extremities  Neurologic Exam Mental  Status: Awake and fully alert. Oriented to place and time. Recent and remote memory intact. Attention span, concentration and fund of knowledge appropriate. Mood and affect appropriate.  Mild dysarthria and occasional word finding difficulties but can be easily understood. Cranial Nerves: Fundoscopic exam reveals sharp disc margins. Pupils equal, briskly reactive to light. Extraocular movements full without nystagmus. Visual fields full to confrontation. Hearing intact. Facial sensation intact.  Right lower facial weakness., tongue, palate moves normally and symmetrically.  Motor: Spastic right hemiplegia with 2/5 right upper extremity and 3/5 right lower extremity strength.  Significant weakness of right grip and intrinsic hand muscles.  Tone is increased on the right compared to the left.  Normal strength tone coordination on the left side.   Sensory.: intact to touch , pinprick , position and vibratory sensation.  Coordination: Rapid alternating movements normal in all extremities. Finger-to-nose and heel-to-shin performed accurately bilaterally. Gait and Station: Unable to test as patient is a 2 person assist to even stand  Reflexes: 1+ and asymmetric and brisker on the right. Toes downgoing.   NIHSS  7 Modified Rankin  4  ASSESSMENT: 67 year Caucasian lady with large left basal ganglia infarct of cryptogenic etiology in July 2019 with residual mild dysarthria and spastic right hemiplegia.  Vascular risk  factors of hypertension, diabetes, hyperlipidemia and cerebrovascular disease    PLAN: I had a long d/w patient and her neice about her recent  cryptogenic stroke,spastic hemiplegia, risk for recurrent stroke/TIAs, personally independently reviewed imaging studies and stroke evaluation results and answered questions.Continue aspirin 325 mg daily  for secondary stroke prevention and maintain strict control of hypertension with blood pressure goal below 130/90, diabetes with hemoglobin A1c goal below 6.5% and lipids with LDL cholesterol goal below 70 mg/dL. I also advised the patient to eat a healthy diet with plenty of whole grains, cereals, fruits and vegetables, exercise regularly and maintain ideal body weight .  She was advised to continue ongoing home physical therapy and then transition to outpatient physical and occupational therapy for gait and balance training.  She may also consider possible participation in the [place] stroke trial if interested.  Greater than 50% time during this 45-minute consultation visit was spent in counseling and coordination of care about her cryptogenic stroke, spastic hemiplegia and discussion about stroke prevention and treatment and answering questions.  Follow up in future with my nurse practitioner Janett Billow in 3 months or call earlier if necessary  Antony Contras, MD  [facility] [address] [place], [place] [address]  Phone [phone] Fax [phone] Note: This document was prepared with digital dictation and possible smart phrase technology. Any transcriptional errors that result from this process are unintentional.

## 2018-02-06 DIAGNOSIS — K5790 Diverticulosis of intestine, part unspecified, without perforation or abscess without bleeding: Secondary | ICD-10-CM | POA: Diagnosis not present

## 2018-02-06 DIAGNOSIS — E119 Type 2 diabetes mellitus without complications: Secondary | ICD-10-CM | POA: Diagnosis not present

## 2018-02-06 DIAGNOSIS — R69 Illness, unspecified: Secondary | ICD-10-CM | POA: Diagnosis not present

## 2018-02-06 DIAGNOSIS — M797 Fibromyalgia: Secondary | ICD-10-CM | POA: Diagnosis not present

## 2018-02-06 DIAGNOSIS — I69322 Dysarthria following cerebral infarction: Secondary | ICD-10-CM | POA: Diagnosis not present

## 2018-02-06 DIAGNOSIS — I69351 Hemiplegia and hemiparesis following cerebral infarction affecting right dominant side: Secondary | ICD-10-CM | POA: Diagnosis not present

## 2018-02-06 DIAGNOSIS — I69392 Facial weakness following cerebral infarction: Secondary | ICD-10-CM | POA: Diagnosis not present

## 2018-02-06 DIAGNOSIS — I69391 Dysphagia following cerebral infarction: Secondary | ICD-10-CM | POA: Diagnosis not present

## 2018-02-06 DIAGNOSIS — I10 Essential (primary) hypertension: Secondary | ICD-10-CM | POA: Diagnosis not present

## 2018-02-06 DIAGNOSIS — R1310 Dysphagia, unspecified: Secondary | ICD-10-CM | POA: Diagnosis not present

## 2018-02-07 DIAGNOSIS — E1165 Type 2 diabetes mellitus with hyperglycemia: Secondary | ICD-10-CM | POA: Diagnosis not present

## 2018-02-07 DIAGNOSIS — Z8673 Personal history of transient ischemic attack (TIA), and cerebral infarction without residual deficits: Secondary | ICD-10-CM | POA: Diagnosis not present

## 2018-02-07 DIAGNOSIS — I10 Essential (primary) hypertension: Secondary | ICD-10-CM | POA: Diagnosis not present

## 2018-02-12 ENCOUNTER — Ambulatory Visit (HOSPITAL_COMMUNITY): Payer: Medicare HMO | Attending: Internal Medicine

## 2018-02-12 ENCOUNTER — Encounter (HOSPITAL_COMMUNITY): Payer: Self-pay

## 2018-02-12 ENCOUNTER — Other Ambulatory Visit: Payer: Self-pay

## 2018-02-12 DIAGNOSIS — R2681 Unsteadiness on feet: Secondary | ICD-10-CM | POA: Insufficient documentation

## 2018-02-12 DIAGNOSIS — R262 Difficulty in walking, not elsewhere classified: Secondary | ICD-10-CM | POA: Diagnosis not present

## 2018-02-12 DIAGNOSIS — M6281 Muscle weakness (generalized): Secondary | ICD-10-CM | POA: Insufficient documentation

## 2018-02-12 NOTE — Therapy (Signed)
[facility] [address] [place], [place], [address] Phone: [phone]   Fax:  [phone]  Physical Therapy Evaluation  Patient Details  Name: [name] MRN: [id number] Date of Birth: Oct 19, 1955 Referring Provider (PT): Benita Stabile, MD   Encounter Date: 02/12/2018  PT End of Session - 02/12/18 1350    Visit Number  1    Number of Visits  17    Date for PT Re-Evaluation  04/09/18   mini reassessment 03/12/18   Authorization Type  [employer] HMO    Authorization Time Period  02/12/18 to 04/09/18    Authorization - Visit Number  1    Authorization - Number of Visits  10    PT Start Time  0901    PT Stop Time  0940    PT Time Calculation (min)  39 min    Equipment Utilized During Treatment  Gait belt    Activity Tolerance  Patient tolerated treatment well;Patient limited by fatigue    Behavior During Therapy  [facility] for tasks assessed/performed       Past Medical History:  Diagnosis Date  . Diabetes mellitus without complication (HCC)   . Diverticulitis   . Fibromyalgia   . Hyperlipidemia   . Hypertension   . Papule 03/22/2015  . Stroke [facility])     Past Surgical History:  Procedure Laterality Date  . CHOLECYSTECTOMY    . KNEE SURGERY Left     There were no vitals filed for this visit.   Subjective Assessment - 02/12/18 0904    Subjective  Pt states she went to the hospital in July 2019 after she woke up that morning and fell. She denies any progressive weakness leading to that. She states that she had an ischemic stroke; per imaging "Acute and subacute left lateral lenticulostriate territory." She states that she went to the [facility] for approximatley 5-6 weeks for rehab and then received HHPT. She was d/c from HHPT on 02/07/18. She is currently having the most difficulty with R arm and R leg. She states that after about 3 laps around her house her leg gets tired and she can't control. She denies any recent falls  but she does report one close call earlier this week. Her husband states that her R foot started to drag and he had to catch her. She reports using a hemiwalker for ambulation.     Patient is accompained by:  Family member    Limitations  Walking;House hold activities    How long can you stand comfortably?  hasn't tried    How long can you walk comfortably?  3 laps around house, about 10 mins, with hemi    Patient Stated Goals  be able to walk better    Currently in Pain?  No/denies         [facility] PT Assessment - 02/12/18 0001      Assessment   Medical Diagnosis  CVA    Referring Provider (PT)  Benita Stabile, MD    Onset Date/Surgical Date  11/25/17    Next MD Visit  November 2019     Prior Therapy  [facility] and HHPT (d/c on 02/07/18)      Precautions   Precautions  Fall      Balance Screen   Has the patient fallen in the past 6 months  Yes    How many times?  1   when she went to hospital in July  Has the patient had a decrease in activity level because of a fear of falling?   Yes    Is the patient reluctant to leave their home because of a fear of falling?   No      Home Environment   Living Environment  Private residence    Available Help at Discharge  Family      Prior Function   Level of Independence  Needs assistance with ADLs;Needs assistance with homemaking   requires assistance for dressing and bathing   Vocation  Retired    Leisure  "whatever I can"      Observation/Other Assessments   Focus on Therapeutic Outcomes (FOTO)   to be completed next visit      Sensation   Additional Comments  reports n/t in R hand, none in BLE      Coordination   Heel Shin Test  RLE limited due to weakness; LLE WNL    9 Hole Peg Test  RAMPs limited due to RLE      ROM / Strength   AROM / PROM / Strength  Strength      Strength   Strength Assessment Site  Hip;Knee;Ankle    Right Hip Flexion  4/5    Right Hip Extension  3+/5   during functional activity   Right Hip  ABduction  4/5   in sitting   Left Hip Flexion  4+/5    Left Hip Extension  3+/5   during functional activity   Left Hip ABduction  4+/5   in sitting   Right Knee Flexion  4/5   in sitting   Right Knee Extension  4+/5    Left Knee Flexion  5/5   in sitting   Left Knee Extension  4+/5    Right Ankle Dorsiflexion  3/5    Left Ankle Dorsiflexion  4+/5      Ambulation/Gait   Ambulation Distance (Feet)  130 Feet     Assistive device  Hemi-walker;Other (Comment)   w/c follow   Gait Pattern  Step-to pattern;Decreased stride length;Decreased dorsiflexion - right;Right circumduction;Ataxic   R toe drag 25-50% of the time, increased as fatigue increase     Standardized Balance Assessment   Standardized Balance Assessment  Five Times Sit to Stand;Timed Up and Go Test;Berg Balance Test    Five times sit to stand comments   from w/c: LUE assistance 35seconds      Berg Balance Test   Berg comment:  to be completed next visit      Timed Up and Go Test   TUG  Normal TUG    TUG Comments  1:16 hemiwalker            Objective measurements completed on examination: See above findings.         PT Education - 02/12/18 1350    Education Details  exam findings, POC, HEP    Person(s) Educated  Patient    Methods  Handout;Explanation    Comprehension  Verbalized understanding       PT Short Term Goals - 02/12/18 1625      PT SHORT TERM GOAL #1   Title  Pt will be independent with HEP and perform consistently in order to maximize strength, balance, and independence at home.    Time  4    Period  Weeks    Status  New    Target Date  03/12/18      PT SHORT TERM GOAL #2  Title  Pt will have improved MMT by 1/2 grade in order to maximize transfers, gait, and balance.    Time  4    Period  Weeks    Status  New      PT SHORT TERM GOAL #3   Title  Pt will have improved BERG balance score to 40/56 to demo improved balance and decreased risk for falls.    Time  4    Period   Weeks    Status  New        PT Long Term Goals - 02/12/18 1625      PT LONG TERM GOAL #1   Title  Pt will have improved MMT by 1 grade throughout in order to further maximize functional mobility, balance, and decrease her risk for falls.    Time  8    Period  Weeks    Status  New    Target Date  04/09/18      PT LONG TERM GOAL #2   Title  Pt will have improved BERG balance score to 46/56 in order to further demo improved balance and decreased risk for falls.    Time  8    Period  Weeks    Status  New      PT LONG TERM GOAL #3   Title  Pt will be able to perform the TUG in 45sec or better with LRAD to demo improved functional mobility and balance in order to maximize [facility] ambulation.     Time  8    Period  Weeks    Status  New      PT LONG TERM GOAL #4   Title  Pt will have improved 5xSTS to 15sec or < without UE to demo improved functional strength and mobility.    Time  8    Period  Weeks    Status  New      PT LONG TERM GOAL #5   Title  Pt will have improved by 134ft or > with LRAD, mod I, and without evidence of R toe drag in order to demo improved functional mobility and maximize her community ambulation.     Time  8    Period  Weeks    Status  New             Plan - 02/12/18 1622    Clinical Impression Statement  Pt is pleasant 62YO F who presents to OPPT s/p acute-subacute CVA in July 2091. Per imaging/chart review, she had acute and subacute left lateral lenticulostriate territory." Pt was at [facility] for 5-6 weeks for rehab and then had HHPT until 02/07/18 and is now presenting to OPPT for continued rehab. She currently presents with deficits in coordination, proprioception, MMT, functional strength, balance, gait, and functional mobility due to R-sided weakness. She is currently ambulating with hemi-walker and had intermittent R toe drag during gait. Pt required 1:16 to complete TUG with hemi-walker and she required 35sec to complete 5xSTS. Pt needs  skilled PT intervention to address impairments in strength, balance, and gait in order to maximize independence at home, QOL, and decreased risk for falls.     Clinical Presentation  Stable    Clinical Presentation due to:  see flowsheets for objective tests and measures    Clinical Decision Making  Low    Rehab Potential  Good    PT Frequency  2x / week    PT Duration  8 weeks  PT Treatment/Interventions  ADLs/Self Care Home Management;Aquatic Therapy;Cryotherapy;Electrical Stimulation;Moist Heat;Ultrasound;DME Instruction;Gait training;Functional mobility Network engineer;Therapeutic activities;Therapeutic exercise;Balance training;Neuromuscular re-education;Patient/family education;Orthotic Fit/Training;Manual techniques;Wheelchair mobility training;Scar mobilization;Passive range of motion;Dry needling;Energy conservation;Taping    PT Next Visit Plan  review goals; retest isolated hip strength/bed mobility, complete FOTO and BERG, review HEP, initiate BLE and functional strengthening, and balance work    PT Home Exercise Plan  eval: bridging, supine march, supine clams RTB    Recommended Other Services  OT referral for RUE faxed to Dr. Scharlene Gloss office     Consulted and Agree with Plan of Care  Patient;Family member/caregiver    Family Member Consulted  husband       Patient will benefit from skilled therapeutic intervention in order to improve the following deficits and impairments:  Abnormal gait, Decreased activity tolerance, Decreased balance, Decreased coordination, Decreased endurance, Decreased mobility, Decreased range of motion, Decreased strength, Difficulty walking, Increased muscle spasms, Impaired flexibility, Impaired UE functional use  Visit Diagnosis: Muscle weakness (generalized) - Plan: PT plan of care cert/re-cert  Difficulty in walking, not elsewhere classified - Plan: PT plan of care cert/re-cert  Unsteadiness on feet - Plan: PT plan of care  cert/re-cert     Problem List Patient Active Problem List   Diagnosis Date Noted  . Hypothyroidism 11/29/2017  . Cerebrovascular accident (CVA) (HCC)   . Ischemic stroke (HCC)   . Type 2 diabetes mellitus with hyperglycemia (HCC) 11/25/2017  . HLD (hyperlipidemia) 11/25/2017  . Fibromyalgia 11/25/2017  . Depression 11/25/2017  . Tobacco abuse 11/25/2017  . Papule 03/22/2015  . Hypertension   . Acute diverticulitis 11/08/2014  . Diabetes (HCC) 11/08/2014  . Renal insufficiency 11/08/2014  . Abdominal pain 11/08/2014  . Left leg weakness 04/10/2011  . Abnormal gait 04/10/2011  . S/P total knee replacement 12/28/2010  . Knee pain 12/28/2010       Jac Canavan PT, DPT  [facility] [address] [place], [place], [address] Phone: [phone]   Fax:  [phone]  Name: [name] MRN: [id number] Date of Birth: May 25, 1955

## 2018-02-14 ENCOUNTER — Encounter (HOSPITAL_COMMUNITY): Payer: Self-pay

## 2018-02-14 ENCOUNTER — Ambulatory Visit (HOSPITAL_COMMUNITY): Payer: Medicare HMO

## 2018-02-14 DIAGNOSIS — R262 Difficulty in walking, not elsewhere classified: Secondary | ICD-10-CM

## 2018-02-14 DIAGNOSIS — M6281 Muscle weakness (generalized): Secondary | ICD-10-CM

## 2018-02-14 DIAGNOSIS — R2681 Unsteadiness on feet: Secondary | ICD-10-CM

## 2018-02-14 NOTE — Therapy (Signed)
[facility] [address] [place], [place], [address] Phone: [phone]   Fax:  [phone]  Physical Therapy Treatment  Patient Details  Name: [name] MRN: [id number] Date of Birth: Jan 19, 1956 Referring Provider (PT): Benita Stabile, MD   Encounter Date: 02/14/2018  PT End of Session - 02/14/18 1248    Visit Number  2    Number of Visits  17    Date for PT Re-Evaluation  04/09/18   Minireassess 03/12/18   Authorization Type  [employer] Medicare HMO    Authorization Time Period  02/12/18 to 04/09/18    Authorization - Visit Number  2    Authorization - Number of Visits  10    PT Start Time  1034    PT Stop Time  1114    PT Time Calculation (min)  40 min    Equipment Utilized During Treatment  Gait belt    Activity Tolerance  Patient tolerated treatment well;Patient limited by fatigue    Behavior During Therapy  [facility] for tasks assessed/performed       Past Medical History:  Diagnosis Date  . Diabetes mellitus without complication (HCC)   . Diverticulitis   . Fibromyalgia   . Hyperlipidemia   . Hypertension   . Papule 03/22/2015  . Stroke [facility])     Past Surgical History:  Procedure Laterality Date  . CHOLECYSTECTOMY    . KNEE SURGERY Left     There were no vitals filed for this visit.  Subjective Assessment - 02/14/18 1038    Subjective  Pt stated she is feeling good today, no reports of pain.  Hasn't began HEP, she forgot about the exercises.      Patient Stated Goals  be able to walk better    Currently in Pain?  No/denies         [facility] PT Assessment - 02/14/18 0001      Assessment   Medical Diagnosis  CVA    Referring Provider (PT)  Benita Stabile, MD    Onset Date/Surgical Date  11/25/17    Next MD Visit  November 2019     Prior Therapy  [facility] and HHPT (d/c on 02/07/18)      Precautions   Precautions  Fall      Observation/Other Assessments   Focus on Therapeutic Outcomes (FOTO)   45.63%, limited  54%      Strength   Right Hip Extension  2+/5   prone   Right Hip ABduction  --   Lt sidelying   Left Hip Extension  3/5   prone   Left Hip ABduction  --   could not tolerate laying Rt side   Right Knee Flexion  3/5   prone   Left Knee Flexion  4+/5   prone     Standardized Balance Assessment   Standardized Balance Assessment  Berg Balance Test      Berg Balance Test   Sit to Stand  Able to stand using hands after several tries    Standing Unsupported  Able to stand 2 minutes with supervision    Sitting with Back Unsupported but Feet Supported on Floor or Stool  Able to sit safely and securely 2 minutes    Stand to Sit  Uses backs of legs against chair to control descent    Transfers  Able to transfer safely, definite need of hands    Standing Unsupported with Eyes Closed  Able to stand  10 seconds with supervision    Standing Ubsupported with Feet Together  Able to place feet together independently but unable to hold for 30 seconds    From Standing, Reach Forward with Outstretched Arm  Can reach forward >12 cm safely (5")    From Standing Position, Pick up Object from Floor  Unable to pick up shoe, but reaches 2-5 cm (1-2") from shoe and balances independently    From Standing Position, Turn to Look Behind Over each Shoulder  Needs supervision when turning    Turn 360 Degrees  Able to turn 360 degrees safely but slowly    Standing Unsupported, Alternately Place Feet on Step/Stool  Needs assistance to keep from falling or unable to try   Required hemiwalker assistance    Standing Unsupported, One Foot in Front  Needs help to step but can hold 15 seconds    Standing on One Leg  Tries to lift leg/unable to hold 3 seconds but remains standing independently    Total Score  29                   [facility] Adult PT Treatment/Exercise - 02/14/18 0001      Exercises   Exercises  Knee/Hip      Knee/Hip Exercises: Supine   Bridges  10 reps    Bridges Limitations  3-5"     Other Supine Knee/Hip Exercises  bent knee raise 10x 3"    Other Supine Knee/Hip Exercises  clam RTB 10               PT Short Term Goals - 02/12/18 1625      PT SHORT TERM GOAL #1   Title  Pt will be independent with HEP and perform consistently in order to maximize strength, balance, and independence at home.    Time  4    Period  Weeks    Status  New    Target Date  03/12/18      PT SHORT TERM GOAL #2   Title  Pt will have improved MMT by 1/2 grade in order to maximize transfers, gait, and balance.    Time  4    Period  Weeks    Status  New      PT SHORT TERM GOAL #3   Title  Pt will have improved BERG balance score to 40/56 to demo improved balance and decreased risk for falls.    Time  4    Period  Weeks    Status  New        PT Long Term Goals - 02/12/18 1625      PT LONG TERM GOAL #1   Title  Pt will have improved MMT by 1 grade throughout in order to further maximize functional mobility, balance, and decrease her risk for falls.    Time  8    Period  Weeks    Status  New    Target Date  04/09/18      PT LONG TERM GOAL #2   Title  Pt will have improved BERG balance score to 46/56 in order to further demo improved balance and decreased risk for falls.    Time  8    Period  Weeks    Status  New      PT LONG TERM GOAL #3   Title  Pt will be able to perform the TUG in 45sec or better with LRAD to demo improved functional mobility and balance in  order to maximize [facility] ambulation.     Time  8    Period  Weeks    Status  New      PT LONG TERM GOAL #4   Title  Pt will have improved 5xSTS to 15sec or < without UE to demo improved functional strength and mobility.    Time  8    Period  Weeks    Status  New      PT LONG TERM GOAL #5   Title  Pt will have improved by 184ft or > with LRAD, mod I, and without evidence of R toe drag in order to demo improved functional mobility and maximize her community ambulation.     Time  8    Period  Weeks    Status   New            Plan - 02/14/18 1248    Clinical Impression Statement  Reviewed goals.  Objective testing complete including BERG (29/56) and MMT for gluteal musculautre.  FOTO complete for subjective perceived functional abilities as well.  Pt's husband arrived near EOS.  Reviewed importance of compliance with HEP for maximal benefits.  Pt admited to not completing HEP though was able to recall exercise and demonstrate.   Min cueing for Rt LE placement wiht exercises required.  EOS pt limited by fatigue, no reports of pain.      Rehab Potential  Good    PT Frequency  2x / week    PT Duration  8 weeks    PT Treatment/Interventions  ADLs/Self Care Home Management;Aquatic Therapy;Cryotherapy;Electrical Stimulation;Moist Heat;Ultrasound;DME Instruction;Gait training;Functional mobility Network engineer;Therapeutic activities;Therapeutic exercise;Balance training;Neuromuscular re-education;Patient/family education;Orthotic Fit/Training;Manual techniques;Wheelchair mobility training;Scar mobilization;Passive range of motion;Dry needling;Energy conservation;Taping    PT Next Visit Plan  Review compliance iwht HEP.  Initiate BLE and functional strengthening and balance work.      PT Home Exercise Plan  eval: bridging, supine march, supine clams RTB       Patient will benefit from skilled therapeutic intervention in order to improve the following deficits and impairments:  Abnormal gait, Decreased activity tolerance, Decreased balance, Decreased coordination, Decreased endurance, Decreased mobility, Decreased range of motion, Decreased strength, Difficulty walking, Increased muscle spasms, Impaired flexibility, Impaired UE functional use  Visit Diagnosis: Muscle weakness (generalized)  Difficulty in walking, not elsewhere classified  Unsteadiness on feet     Problem List Patient Active Problem List   Diagnosis Date Noted  . Hypothyroidism 11/29/2017  . Cerebrovascular accident  (CVA) (HCC)   . Ischemic stroke (HCC)   . Type 2 diabetes mellitus with hyperglycemia (HCC) 11/25/2017  . HLD (hyperlipidemia) 11/25/2017  . Fibromyalgia 11/25/2017  . Depression 11/25/2017  . Tobacco abuse 11/25/2017  . Papule 03/22/2015  . Hypertension   . Acute diverticulitis 11/08/2014  . Diabetes (HCC) 11/08/2014  . Renal insufficiency 11/08/2014  . Abdominal pain 11/08/2014  . Left leg weakness 04/10/2011  . Abnormal gait 04/10/2011  . S/P total knee replacement 12/28/2010  . Knee pain 12/28/2010   Becky Sax, LPTA; CBIS [phone]  Juel Burrow 02/14/2018, 12:56 PM  [facility] [address] [place], [place], [address] Phone: [phone]   Fax:  [phone]  Name: BELYNDA PAGADUAN MRN: [id number] Date of Birth: [birth year]

## 2018-02-19 ENCOUNTER — Encounter (HOSPITAL_COMMUNITY): Payer: Self-pay

## 2018-02-19 ENCOUNTER — Ambulatory Visit (HOSPITAL_COMMUNITY): Payer: Medicare HMO

## 2018-02-19 DIAGNOSIS — R2681 Unsteadiness on feet: Secondary | ICD-10-CM

## 2018-02-19 DIAGNOSIS — R262 Difficulty in walking, not elsewhere classified: Secondary | ICD-10-CM | POA: Diagnosis not present

## 2018-02-19 DIAGNOSIS — M6281 Muscle weakness (generalized): Secondary | ICD-10-CM | POA: Diagnosis not present

## 2018-02-19 NOTE — Therapy (Signed)
[facility] [address] [place], [place], [address] Phone: [phone]   Fax:  [phone]  Physical Therapy Treatment  Patient Details  Name: [name] MRN: [id number] Date of Birth: 24-Nov-1955 Referring Provider (PT): Benita Stabile, MD   Encounter Date: 02/19/2018  PT End of Session - 02/19/18 1245    Visit Number  3    Number of Visits  17    Date for PT Re-Evaluation  04/09/18   Minireassess 03/12/18   Authorization Type  [employer] HMO    Authorization Time Period  02/12/18 to 04/09/18    Authorization - Visit Number  3    Authorization - Number of Visits  10    PT Start Time  1120    PT Stop Time  1200    PT Time Calculation (min)  40 min    Equipment Utilized During Treatment  Gait belt    Activity Tolerance  Patient tolerated treatment well;Patient limited by fatigue;Patient limited by pain   Rt UE pain   Behavior During Therapy  [facility] for tasks assessed/performed       Past Medical History:  Diagnosis Date  . Diabetes mellitus without complication (HCC)   . Diverticulitis   . Fibromyalgia   . Hyperlipidemia   . Hypertension   . Papule 03/22/2015  . Stroke [facility])     Past Surgical History:  Procedure Laterality Date  . CHOLECYSTECTOMY    . KNEE SURGERY Left     There were no vitals filed for this visit.  Subjective Assessment - 02/19/18 1127    Subjective  Pt stated her Rt arm is really bothering her today, pain scale 10/10.  Has began the HEP everyday since last session    Patient Stated Goals  be able to walk better    Currently in Pain?  Yes    Pain Score  10-Worst pain ever    Pain Location  Arm    Pain Orientation  Right    Pain Descriptors / Indicators  Numbness    Pain Type  Acute pain    Pain Onset  More than a month ago    Pain Frequency  Intermittent    Aggravating Factors   working her arm    Pain Relieving Factors  gel                       [facility] Adult PT  Treatment/Exercise - 02/19/18 0001      Ambulation/Gait   Ambulation Distance (Feet)  200 Feet    Assistive device  Hemi-walker;Other (Comment)   w/c follow   Gait Pattern  Step-to pattern;Decreased stride length;Decreased dorsiflexion - right;Right circumduction;Ataxic      Exercises   Exercises  Knee/Hip      Knee/Hip Exercises: Standing   Heel Raises  Both;10 reps      Knee/Hip Exercises: Seated   Long Arc Quad  10 reps    Other Seated Knee/Hip Exercises  heel/toe raises 10x    Sit to Sand  10 reps;with UE support   1 HHA     Knee/Hip Exercises: Supine   Hip Adduction Isometric  Both;10 reps    Hip Adduction Isometric Limitations  5" holds, HEP    Bridges  10 reps    Bridges Limitations  3-5"   RTB around knees   Straight Leg Raises  Both;10 reps    Other Supine Knee/Hip Exercises  bent knee raise 10x 3"  Other Supine Knee/Hip Exercises  clam RTB 10      Knee/Hip Exercises: Sidelying   Hip ABduction  AAROM;Right;5 reps   couldnt tolerate laying on Lt   Clams  AAROM Rt only 5x 5"          Balance Exercises - 02/19/18 1243      Balance Exercises: Standing   Standing Eyes Opened  Narrow base of support (BOS);2 reps;30 secs    Sidestepping  2 reps;Limitations   2RT front of mat with HW A         PT Short Term Goals - 02/12/18 1625      PT SHORT TERM GOAL #1   Title  Pt will be independent with HEP and perform consistently in order to maximize strength, balance, and independence at home.    Time  4    Period  Weeks    Status  New    Target Date  03/12/18      PT SHORT TERM GOAL #2   Title  Pt will have improved MMT by 1/2 grade in order to maximize transfers, gait, and balance.    Time  4    Period  Weeks    Status  New      PT SHORT TERM GOAL #3   Title  Pt will have improved BERG balance score to 40/56 to demo improved balance and decreased risk for falls.    Time  4    Period  Weeks    Status  New        PT Long Term Goals - 02/12/18 1625       PT LONG TERM GOAL #1   Title  Pt will have improved MMT by 1 grade throughout in order to further maximize functional mobility, balance, and decrease her risk for falls.    Time  8    Period  Weeks    Status  New    Target Date  04/09/18      PT LONG TERM GOAL #2   Title  Pt will have improved BERG balance score to 46/56 in order to further demo improved balance and decreased risk for falls.    Time  8    Period  Weeks    Status  New      PT LONG TERM GOAL #3   Title  Pt will be able to perform the TUG in 45sec or better with LRAD to demo improved functional mobility and balance in order to maximize [facility] ambulation.     Time  8    Period  Weeks    Status  New      PT LONG TERM GOAL #4   Title  Pt will have improved 5xSTS to 15sec or < without UE to demo improved functional strength and mobility.    Time  8    Period  Weeks    Status  New      PT LONG TERM GOAL #5   Title  Pt will have improved by 118ft or > with LRAD, mod I, and without evidence of R toe drag in order to demo improved functional mobility and maximize her community ambulation.     Time  8    Period  Weeks    Status  New            Plan - 02/19/18 1246    Clinical Impression Statement  Reviewed compliance with HEP, pt able to demonstrate without cueing.  Session  focus on LE strengthening with additional open and closed chain activiites.  Gait training complete with hemi, pt able to demonstrate good sequence and no LOB.  Pt was limited by Rt UE pain and fatigue at EOS.  Apts. scheduled for OT referral today.      Rehab Potential  Good    PT Frequency  2x / week    PT Duration  8 weeks    PT Treatment/Interventions  ADLs/Self Care Home Management;Aquatic Therapy;Cryotherapy;Electrical Stimulation;Moist Heat;Ultrasound;DME Instruction;Gait training;Functional mobility Network engineer;Therapeutic activities;Therapeutic exercise;Balance training;Neuromuscular re-education;Patient/family  education;Orthotic Fit/Training;Manual techniques;Wheelchair mobility training;Scar mobilization;Passive range of motion;Dry needling;Energy conservation;Taping    PT Next Visit Plan  Initiate BLE and functional strengthening and balance work.      PT Home Exercise Plan  eval: bridging, supine march, supine clams RTB 10/22: adduction, LAQ, heel/toe raises       Patient will benefit from skilled therapeutic intervention in order to improve the following deficits and impairments:  Abnormal gait, Decreased activity tolerance, Decreased balance, Decreased coordination, Decreased endurance, Decreased mobility, Decreased range of motion, Decreased strength, Difficulty walking, Increased muscle spasms, Impaired flexibility, Impaired UE functional use  Visit Diagnosis: Muscle weakness (generalized)  Difficulty in walking, not elsewhere classified  Unsteadiness on feet     Problem List Patient Active Problem List   Diagnosis Date Noted  . Hypothyroidism 11/29/2017  . Cerebrovascular accident (CVA) (HCC)   . Ischemic stroke (HCC)   . Type 2 diabetes mellitus with hyperglycemia (HCC) 11/25/2017  . HLD (hyperlipidemia) 11/25/2017  . Fibromyalgia 11/25/2017  . Depression 11/25/2017  . Tobacco abuse 11/25/2017  . Papule 03/22/2015  . Hypertension   . Acute diverticulitis 11/08/2014  . Diabetes (HCC) 11/08/2014  . Renal insufficiency 11/08/2014  . Abdominal pain 11/08/2014  . Left leg weakness 04/10/2011  . Abnormal gait 04/10/2011  . S/P total knee replacement 12/28/2010  . Knee pain 12/28/2010   Becky Sax, LPTA; CBIS [phone]  Juel Burrow 02/19/2018, 12:52 PM  [facility] [address] [place], [place], [address] Phone: [phone]   Fax:  [phone]  Name: [name] MRN: [id number] Date of Birth: [birth year]

## 2018-02-19 NOTE — Patient Instructions (Addendum)
Strengthening: Hip Adduction - Isometric    With rolled towel or pillow between knees, squeeze knees together. Hold 5 seconds. Repeat 10 times per set. Do 2 sets per day.  http://orth.exer.us/613   Copyright  VHI. All rights reserved.   Toe / Heel Raise (Sitting)    Sitting, raise heels, then rock back on heels and raise toes. Repeat 20 times.  Copyright  VHI. All rights reserved.   Long [employer]    Straighten operated leg and try to hold it 3seconds.  Repeat 20 times. Do 2 sessions a day.  http://gt2.exer.us/311   Copyright  VHI. All rights reserved.

## 2018-02-21 ENCOUNTER — Ambulatory Visit (HOSPITAL_COMMUNITY): Payer: Medicare HMO | Admitting: Physical Therapy

## 2018-02-21 ENCOUNTER — Encounter (HOSPITAL_COMMUNITY): Payer: Self-pay | Admitting: Physical Therapy

## 2018-02-21 DIAGNOSIS — R262 Difficulty in walking, not elsewhere classified: Secondary | ICD-10-CM | POA: Diagnosis not present

## 2018-02-21 DIAGNOSIS — R2681 Unsteadiness on feet: Secondary | ICD-10-CM | POA: Diagnosis not present

## 2018-02-21 DIAGNOSIS — M6281 Muscle weakness (generalized): Secondary | ICD-10-CM | POA: Diagnosis not present

## 2018-02-21 NOTE — Therapy (Signed)
[facility] [address] [place], [place], [address] Phone: [phone]   Fax:  [phone]  Physical Therapy Treatment  Patient Details  Name: [name] MRN: [id number] Date of Birth: [birth year] Referring Provider (PT): Benita Stabile, MD   Encounter Date: 02/21/2018  PT End of Session - 02/21/18 1141    Visit Number  4    Number of Visits  17    Date for PT Re-Evaluation  04/09/18   Minireassess 03/12/18   Authorization Type  [employer] Medicare HMO    Authorization Time Period  02/12/18 to 04/09/18    Authorization - Visit Number  4    Authorization - Number of Visits  10    PT Start Time  1115    PT Stop Time  1154    PT Time Calculation (min)  39 min    Equipment Utilized During Treatment  Gait belt    Activity Tolerance  Patient tolerated treatment well;Patient limited by fatigue;Patient limited by pain   Rt UE pain   Behavior During Therapy  [facility] for tasks assessed/performed       Past Medical History:  Diagnosis Date  . Diabetes mellitus without complication (HCC)   . Diverticulitis   . Fibromyalgia   . Hyperlipidemia   . Hypertension   . Papule 03/22/2015  . Stroke [facility])     Past Surgical History:  Procedure Laterality Date  . CHOLECYSTECTOMY    . KNEE SURGERY Left     There were no vitals filed for this visit.  Subjective Assessment - 02/21/18 1118    Subjective  Patient reported her right shoulder has an aching pain in it which she would rate as an 8/10.     Patient Stated Goals  be able to walk better    Currently in Pain?  Yes    Pain Score  8     Pain Location  Shoulder    Pain Orientation  Right    Pain Descriptors / Indicators  Aching    Pain Type  Acute pain    Pain Onset  More than a month ago                       [facility] Adult PT Treatment/Exercise - 02/21/18 0001      Ambulation/Gait   Ambulation Distance (Feet)  205 Feet    Assistive device  Hemi-walker;Other (Comment)    w/C follow   Gait Pattern  Decreased stride length;Decreased dorsiflexion - right;Right circumduction;Ataxic;Step-through pattern   Right knee hyperextension in stance   Gait Comments  Patient with loss of balance at 205 feet, requiring mod/max assistance to recover balance. Minguard throughout otherwise with gait belt.       Exercises   Exercises  Knee/Hip      Knee/Hip Exercises: Standing   Heel Raises  Both;10 reps    Other Standing Knee Exercises  Semi-tandem 2 x 30 seconds each LE forward    Other Standing Knee Exercises  Sidestepping at mat 2 roundtrips with HW      Knee/Hip Exercises: Seated   Long Arc Quad  10 reps    Sit to [employer]  10 reps;with UE support   1 HHA. Manual faciliation prevent right knee hyperextension     Knee/Hip Exercises: Supine   Hip Adduction Isometric  Both;10 reps    Hip Adduction Isometric Limitations  5" holds, HEP    Bridges  10 reps  Bridges Limitations  3-5"    Straight Leg Raises  --   Held today due to patient request of pain    Other Supine Knee/Hip Exercises  clam RTB 10               PT Short Term Goals - 02/12/18 1625      PT SHORT TERM GOAL #1   Title  Pt will be independent with HEP and perform consistently in order to maximize strength, balance, and independence at home.    Time  4    Period  Weeks    Status  New    Target Date  03/12/18      PT SHORT TERM GOAL #2   Title  Pt will have improved MMT by 1/2 grade in order to maximize transfers, gait, and balance.    Time  4    Period  Weeks    Status  New      PT SHORT TERM GOAL #3   Title  Pt will have improved BERG balance score to 40/56 to demo improved balance and decreased risk for falls.    Time  4    Period  Weeks    Status  New        PT Long Term Goals - 02/12/18 1625      PT LONG TERM GOAL #1   Title  Pt will have improved MMT by 1 grade throughout in order to further maximize functional mobility, balance, and decrease her risk for falls.    Time   8    Period  Weeks    Status  New    Target Date  04/09/18      PT LONG TERM GOAL #2   Title  Pt will have improved BERG balance score to 46/56 in order to further demo improved balance and decreased risk for falls.    Time  8    Period  Weeks    Status  New      PT LONG TERM GOAL #3   Title  Pt will be able to perform the TUG in 45sec or better with LRAD to demo improved functional mobility and balance in order to maximize [facility] ambulation.     Time  8    Period  Weeks    Status  New      PT LONG TERM GOAL #4   Title  Pt will have improved 5xSTS to 15sec or < without UE to demo improved functional strength and mobility.    Time  8    Period  Weeks    Status  New      PT LONG TERM GOAL #5   Title  Pt will have improved by 173ft or > with LRAD, mod I, and without evidence of R toe drag in order to demo improved functional mobility and maximize her community ambulation.     Time  8    Period  Weeks    Status  New            Plan - 02/21/18 1202    Clinical Impression Statement  This session continued with established plan of care with focus on balance and functional lower extremity strengthening exercises. This session added semi-tandem balance at parallel bars. Held supine hip flexion exercises due to patient request and reported soreness following last session with these. At end of ambulation patient had 1 instance of loss of balance requiring assistance to regain balance.  Patient would  benefit from continued skilled physical therapy in order to continue progressing towards functional goals.     Rehab Potential  Good    PT Frequency  2x / week    PT Duration  8 weeks    PT Treatment/Interventions  ADLs/Self Care Home Management;Aquatic Therapy;Cryotherapy;Electrical Stimulation;Moist Heat;Ultrasound;DME Instruction;Gait training;Functional mobility Network engineer;Therapeutic activities;Therapeutic exercise;Balance training;Neuromuscular re-education;Patient/family  education;Orthotic Fit/Training;Manual techniques;Wheelchair mobility training;Scar mobilization;Passive range of motion;Dry needling;Energy conservation;Taping    PT Next Visit Plan  Continue BLE and functional strengthening and balance work.      PT Home Exercise Plan  eval: bridging, supine march, supine clams RTB 10/22: adduction, LAQ, heel/toe raises       Patient will benefit from skilled therapeutic intervention in order to improve the following deficits and impairments:  Abnormal gait, Decreased activity tolerance, Decreased balance, Decreased coordination, Decreased endurance, Decreased mobility, Decreased range of motion, Decreased strength, Difficulty walking, Increased muscle spasms, Impaired flexibility, Impaired UE functional use  Visit Diagnosis: Muscle weakness (generalized)  Difficulty in walking, not elsewhere classified  Unsteadiness on feet     Problem List Patient Active Problem List   Diagnosis Date Noted  . Hypothyroidism 11/29/2017  . Cerebrovascular accident (CVA) (HCC)   . Ischemic stroke (HCC)   . Type 2 diabetes mellitus with hyperglycemia (HCC) 11/25/2017  . HLD (hyperlipidemia) 11/25/2017  . Fibromyalgia 11/25/2017  . Depression 11/25/2017  . Tobacco abuse 11/25/2017  . Papule 03/22/2015  . Hypertension   . Acute diverticulitis 11/08/2014  . Diabetes (HCC) 11/08/2014  . Renal insufficiency 11/08/2014  . Abdominal pain 11/08/2014  . Left leg weakness 04/10/2011  . Abnormal gait 04/10/2011  . S/P total knee replacement 12/28/2010  . Knee pain 12/28/2010   Verne Carrow PT, DPT 12:04 PM, 02/21/18 [phone]  [facility] [address] [place], [place], [address] Phone: [phone]   Fax:  [phone]  Name: ADORIA KAWAMOTO MRN: [id number] Date of Birth: December 05, 1955

## 2018-02-22 ENCOUNTER — Telehealth: Payer: Self-pay | Admitting: Rheumatology

## 2018-02-22 ENCOUNTER — Telehealth: Payer: Self-pay | Admitting: *Deleted

## 2018-02-22 MED ORDER — DICLOFENAC SODIUM 1 % TD GEL: TRANSDERMAL | 3 refills | Status: DC

## 2018-02-22 NOTE — Telephone Encounter (Signed)
Prior Authorization submitted via cover my meds for Voltaren Gel. Will update once response is received.  

## 2018-02-22 NOTE — Telephone Encounter (Signed)
Last visit: 11/09/16 Next Visit: 02/27/18  Okay to refill per Dr. Corliss Skains

## 2018-02-22 NOTE — Telephone Encounter (Signed)
Patient called requesting prescription refill of Voltaren Gel to be sent to [facility] in [place].

## 2018-02-25 NOTE — Progress Notes (Signed)
Office Visit Note  Patient: [name]             Date of Birth: [birth year]           MRN: [id number]             PCP: Benita Stabile, MD Referring: Benita Stabile, MD Visit Date: 02/27/2018 Occupation: @GUAROCC @  Subjective:  Right shoulder pain   History of Present Illness: [name] is a 62 y.o. female with history of fibromyalgia and trochanteric bursitis bilaterally. The patient is accompanied by her husband today.  She reports she had a stroke in July 2019, and she continues to have right sided weakness.  She states her speech has been improving.  She states she has been having right shoulder pain, especially at night.  She has limited passive ROM but is unable to actively lift her right arm.  She requested a cortisone injection today.  She has not tried wearing a sling.  She states she will be started physical therapy 03/05/18.  She continues to have chronic left knee pain following the replacement. She has generalized muscle aches and muscle tenderness due to fibromyalgia.   Activities of Daily Living:  Patient reports morning stiffness for 2 hours.   Patient Reports nocturnal pain.  Difficulty dressing/grooming: Reports Difficulty climbing stairs: Reports Difficulty getting out of chair: Reports Difficulty using hands for taps, buttons, cutlery, and/or writing: Reports  Review of Systems  Constitutional: Positive for fatigue.  HENT: Negative for mouth sores, mouth dryness and nose dryness.   Eyes: Negative for pain, visual disturbance and dryness.  Respiratory: Negative for cough, hemoptysis, shortness of breath and difficulty breathing.   Cardiovascular: Negative for chest pain, palpitations, hypertension and swelling in legs/feet.  Gastrointestinal: Negative for blood in stool, constipation and diarrhea.  Endocrine: Negative for increased urination.  Genitourinary: Negative for painful urination.  Musculoskeletal: Positive for arthralgias, joint pain,  myalgias, muscle weakness, muscle tenderness and myalgias. Negative for joint swelling and morning stiffness.  Skin: Negative for color change, pallor, rash, hair loss, nodules/bumps, skin tightness, ulcers and sensitivity to sunlight.  Allergic/Immunologic: Negative for susceptible to infections.  Neurological: Positive for weakness. Negative for dizziness, numbness and headaches.  Hematological: Negative for swollen glands.  Psychiatric/Behavioral: Negative for depressed mood and sleep disturbance. The patient is not nervous/anxious.     PMFS History:  Patient Active Problem List   Diagnosis Date Noted  . Hypothyroidism 11/29/2017  . Cerebrovascular accident (CVA) (HCC)   . Ischemic stroke (HCC)   . Type 2 diabetes mellitus with hyperglycemia (HCC) 11/25/2017  . HLD (hyperlipidemia) 11/25/2017  . Fibromyalgia 11/25/2017  . Depression 11/25/2017  . Tobacco abuse 11/25/2017  . Papule 03/22/2015  . Hypertension   . Acute diverticulitis 11/08/2014  . Diabetes (HCC) 11/08/2014  . Renal insufficiency 11/08/2014  . Abdominal pain 11/08/2014  . Left leg weakness 04/10/2011  . Abnormal gait 04/10/2011  . S/P total knee replacement 12/28/2010  . Knee pain 12/28/2010    Past Medical History:  Diagnosis Date  . Diabetes mellitus without complication (HCC)   . Diverticulitis   . Fibromyalgia   . Hyperlipidemia   . Hypertension   . Papule 03/22/2015  . Stroke [facility])     Family History  Problem Relation Age of Onset  . Diabetes Mother   . Dementia Mother   . Hypertension Mother   . Stroke Mother   . Alcohol abuse Father   . Cirrhosis Father   . Arthritis  Sister        rheumatoid  . Diabetes Brother   . Hypertension Brother   . Diabetes Maternal Grandmother   . Fibromyalgia Sister   . Hypertension Sister   . Fibromyalgia Sister   . Hypertension Sister   . Diabetes Sister   . Hypertension Sister   . Cirrhosis Brother   . Cancer Brother        pancreatic   Past Surgical  History:  Procedure Laterality Date  . CHOLECYSTECTOMY    . KNEE SURGERY Left    Social History   Social History Narrative  . Not on file    Objective: Vital Signs: BP (!) 153/89 (BP Location: Left Arm, Patient Position: Sitting, Cuff Size: Normal)   Pulse 86   Resp 14   Ht 5\' 3"  (1.6 m)   Wt 154 lb (69.9 kg)   BMI 27.28 kg/m    Physical Exam  Constitutional: She is oriented to person, place, and time. She appears well-developed and well-nourished.  HENT:  Head: Normocephalic and atraumatic.  Eyes: Conjunctivae and EOM are normal.  Neck: Normal range of motion.  Cardiovascular: Normal rate, regular rhythm, normal heart sounds and intact distal pulses.  Pulmonary/Chest: Effort normal and breath sounds normal.  Abdominal: Soft. Bowel sounds are normal.  Lymphadenopathy:    She has no cervical adenopathy.  Neurological: She is alert and oriented to person, place, and time.  Skin: Skin is warm and dry. Capillary refill takes less than 2 seconds.  Psychiatric: She has a normal mood and affect. Her behavior is normal.  Nursing note and vitals reviewed.    Musculoskeletal Exam: C-spine good ROM.  Thoracic and lumbar spine difficult to assess while patient is in wheelchair. No midline spinal tenderness. Right shoulder limited passive ROM.  Left shoulder full active ROM. Elbow joints good ROM.  Wrist joints no tenderness or swelling. MCPs, PIPs, and DIPs good ROM with no synovitis.  Left knee replacement good ROM.  Right knee full ROM. No warmth or effusion of knee joints.  No tenderness of ankle joints.  Pedal edema bilaterally.   CDAI Exam: CDAI Score: Not documented Patient Global Assessment: Not documented; Provider Global Assessment: Not documented Swollen: Not documented; Tender: Not documented Joint Exam   Not documented   There is currently no information documented on the homunculus. Go to the Rheumatology activity and complete the homunculus joint  exam.  Investigation: No additional findings.  Imaging: No results found.  Recent Labs: Lab Results  Component Value Date   WBC 5.3 12/26/2017   HGB 13.1 12/26/2017   PLT 285 12/26/2017   NA 141 12/26/2017   K 4.0 12/26/2017   CL 106 12/26/2017   CO2 27 12/26/2017   GLUCOSE 137 (H) 12/26/2017   BUN 20 12/26/2017   CREATININE 0.95 12/26/2017   BILITOT 0.6 11/25/2017   ALKPHOS 87 11/25/2017   AST 35 11/25/2017   ALT 34 11/25/2017   PROT 7.2 11/25/2017   ALBUMIN 3.4 (L) 11/25/2017   CALCIUM 9.5 12/26/2017   GFRAA >60 12/26/2017    Speciality Comments: No specialty comments available.  Procedures:  No procedures performed Allergies: Bee pollen; Bee venom; and Codeine   Assessment / Plan:     Visit Diagnoses: Fibromyalgia: She continues to have generalized muscle aches and muscle tenderness.  She has positive tender points.  She has trapezius muscle tension and tenderness and bilateral trochanteric bursitis.  She has been having right shoulder pain following the stroke in July 2019.  She has limited passive right shoulder ROM.  She has significant right sided muscle weakness. She will be starting physical therapy on 03/05/18.  She was encouraged to wear a right shoulder sling. She requested a refill of Voltaren gel, which she can apply topically 3 times daily PRN.  Chronic fatigue: She continues to have chronic fatigue.   Primary insomnia - She wakes up several times per night due to experiencing pain.   Chronic SI joint pain: She has no SI joint tenderness on exam today.   Greater trochanteric bursitis of both hips: She continues to have tenderness of bilateral trochanteric bursa.   Chronic right shoulder pain: She has limited passive ROM of the right shoulder.  She has right sided muscle weakness following the stroke in July 2019. She requested a cortisone injection today but we explained the risks.  She was encouraged to wear a right arm sling.  She will be starting PT on  03/05/18.   Other medical conditions are listed as follows:   Essential hypertension  History of CVA (cerebrovascular accident): July 2019. She has right sided muscle weakness.  Her strength and speech have slowly been improving. She is starting PT 03/05/18.   History of diverticulitis  History of diabetes mellitus, type II  History of hypothyroidism  Renal insufficiency  History of hyperlipidemia   Orders: No orders of the defined types were placed in this encounter.  Meds ordered this encounter  Medications  . diclofenac sodium (VOLTAREN) 1 % GEL    Sig: Apply 3 g to 3 large joints up to 3 times a day when necessary    Dispense:  3 Tube    Refill:  3    Follow-Up Instructions: Return in about 6 months (around 08/29/2018) for Fibromyalgia.   Gearldine Bienenstock, PA-C  Note - This record has been created using Dragon software.  Chart creation errors have been sought, but may not always  have been located. Such creation errors do not reflect on  the standard of medical care.

## 2018-02-26 ENCOUNTER — Encounter (HOSPITAL_COMMUNITY): Payer: Self-pay

## 2018-02-26 ENCOUNTER — Ambulatory Visit (HOSPITAL_COMMUNITY): Payer: Medicare HMO

## 2018-02-26 DIAGNOSIS — M6281 Muscle weakness (generalized): Secondary | ICD-10-CM | POA: Diagnosis not present

## 2018-02-26 DIAGNOSIS — R262 Difficulty in walking, not elsewhere classified: Secondary | ICD-10-CM

## 2018-02-26 DIAGNOSIS — R2681 Unsteadiness on feet: Secondary | ICD-10-CM | POA: Diagnosis not present

## 2018-02-26 NOTE — Therapy (Signed)
[facility] [place], [place], [address] Phone: [phone]   Fax:  [phone]  Physical Therapy Treatment  Patient Details  Name: [name] MRN: [id number] Date of Birth: Mar 18, 1956 Referring Provider (PT): Celene Squibb, MD   Encounter Date: 02/26/2018  PT End of Session - 02/26/18 1027    Visit Number  5    Number of Visits  17    Date for PT Re-Evaluation  04/09/18   Minireassess 03/12/18   Authorization Type  [employer] HMO    Authorization Time Period  02/12/18 to 04/09/18    Authorization - Visit Number  5    Authorization - Number of Visits  10    PT Start Time  1030    PT Stop Time  1117    PT Time Calculation (min)  47 min    Equipment Utilized During Treatment  Gait belt    Activity Tolerance  Patient tolerated treatment well;Patient limited by fatigue;Patient limited by pain   Rt UE pain   Behavior During Therapy  [facility] for tasks assessed/performed       Past Medical History:  Diagnosis Date  . Diabetes mellitus without complication ([place])   . Diverticulitis   . Fibromyalgia   . Hyperlipidemia   . Hypertension   . Papule 03/22/2015  . Stroke [facility])     Past Surgical History:  Procedure Laterality Date  . CHOLECYSTECTOMY    . KNEE SURGERY Left     There were no vitals filed for this visit.  Subjective Assessment - 02/26/18 1027    Subjective  Patient reported her right shoulder has an aching pain in it which she would rate as an 7/10.  Not as bad today.    Patient Stated Goals  be able to walk better    Pain Onset  More than a month ago                       [facility] Adult PT Treatment/Exercise - 02/26/18 0001      Ambulation/Gait   Ambulation Distance (Feet)  226 Feet    Assistive device  Hemi-walker;Other (Comment)   w/C follow   Gait Pattern  Decreased stride length;Decreased dorsiflexion - right;Right circumduction;Ataxic;Step-through pattern   Right knee  hyperextension in stance     Knee/Hip Exercises: Standing   Heel Raises  Both;10 reps    Other Standing Knee Exercises  Semi-tandem 2 x 30 seconds each LE forward    Other Standing Knee Exercises  Sidestepping at mat 2 roundtrips with HW      Knee/Hip Exercises: Seated   Long Arc Quad  10 reps    Sit to [employer]  10 reps;with UE support   Manual fac prevent right knee hyperext; 21 1/2" mat height     Knee/Hip Exercises: Supine   Hip Adduction Isometric  Both;10 reps    Hip Adduction Isometric Limitations  5" holds, HEP    Bridges  10 reps    Bridges Limitations  3-5"    Straight Leg Raises  Both;10 reps    Other Supine Knee/Hip Exercises  clam RTB 10      Knee/Hip Exercises: Sidelying   Hip ABduction  AAROM;Right;5 reps   couldnt tolerate laying on Rt   Clams  AAROM Rt only 5x 5"          Balance Exercises - 02/26/18 1104      Balance Exercises: Standing  Standing Eyes Opened  Narrow base of support (BOS);Solid surface;2 reps;30 secs    Sidestepping  2 reps;Limitations   2RT front of mat with HW A       PT Education - 02/26/18 1214    Education Details  Discussed purpose and technique of interventions throughout session.    Person(s) Educated  Patient    Methods  Explanation    Comprehension  Verbalized understanding;Returned demonstration       PT Short Term Goals - 02/26/18 1028      PT SHORT TERM GOAL #1   Title  Pt will be independent with HEP and perform consistently in order to maximize strength, balance, and independence at home.    Time  4    Period  Weeks    Status  Partially Met      PT SHORT TERM GOAL #2   Title  Pt will have improved MMT by 1/2 grade in order to maximize transfers, gait, and balance.    Time  4    Period  Weeks    Status  On-going      PT SHORT TERM GOAL #3   Title  Pt will have improved BERG balance score to 40/56 to demo improved balance and decreased risk for falls.    Time  4    Period  Weeks    Status  On-going         PT Long Term Goals - 02/26/18 1028      PT LONG TERM GOAL #1   Title  Pt will have improved MMT by 1 grade throughout in order to further maximize functional mobility, balance, and decrease her risk for falls.    Time  8    Period  Weeks    Status  On-going      PT LONG TERM GOAL #2   Title  Pt will have improved BERG balance score to 46/56 in order to further demo improved balance and decreased risk for falls.    Time  8    Period  Weeks    Status  On-going      PT LONG TERM GOAL #3   Title  Pt will be able to perform the TUG in 45sec or better with LRAD to demo improved functional mobility and balance in order to maximize [facility] ambulation.     Time  8    Period  Weeks    Status  On-going      PT LONG TERM GOAL #4   Title  Pt will have improved 5xSTS to 15sec or < without UE to demo improved functional strength and mobility.    Time  8    Period  Weeks    Status  On-going      PT LONG TERM GOAL #5   Title  Pt will have improved 3MWT by 119f or > with LRAD, mod I, and without evidence of R toe drag in order to demo improved functional mobility and maximize her community ambulation.     Time  8    Period  Weeks    Status  On-going            Plan - 02/26/18 1027    Clinical Impression Statement  This session continued with established plan of care with focus on balance and functional lower extremity strengthening exercises. Continued semi-tandem balance at parallel bars. Resumed supine hip flexion exercises as patient agreeable and not as sore as last session. Ambulation distance improved with  no over loss of balance noted today. Patient would benefit from continued skilled physical therapy in order to continue progressing towards functional goals. Continue with current plan, progress as able.     Rehab Potential  Good    PT Frequency  2x / week    PT Duration  8 weeks    PT Treatment/Interventions  ADLs/Self Care Home Management;Aquatic Therapy;Cryotherapy;Electrical  Stimulation;Moist Heat;Ultrasound;DME Instruction;Gait training;Functional mobility Scientist, forensic;Therapeutic activities;Therapeutic exercise;Balance training;Neuromuscular re-education;Patient/family education;Orthotic Fit/Training;Manual techniques;Wheelchair mobility training;Scar mobilization;Passive range of motion;Dry needling;Energy conservation;Taping    PT Next Visit Plan  Continue BLE and functional strengthening and balance work.      PT Home Exercise Plan  eval: bridging, supine march, supine clams RTB 10/22: adduction, LAQ, heel/toe raises       Patient will benefit from skilled therapeutic intervention in order to improve the following deficits and impairments:  Abnormal gait, Decreased activity tolerance, Decreased balance, Decreased coordination, Decreased endurance, Decreased mobility, Decreased range of motion, Decreased strength, Difficulty walking, Increased muscle spasms, Impaired flexibility, Impaired UE functional use  Visit Diagnosis: Muscle weakness (generalized)  Difficulty in walking, not elsewhere classified  Unsteadiness on feet     Problem List Patient Active Problem List   Diagnosis Date Noted  . Hypothyroidism 11/29/2017  . Cerebrovascular accident (CVA) ([place])   . Ischemic stroke ([place])   . Type 2 diabetes mellitus with hyperglycemia ([place]) 11/25/2017  . HLD (hyperlipidemia) 11/25/2017  . Fibromyalgia 11/25/2017  . Depression 11/25/2017  . Tobacco abuse 11/25/2017  . Papule 03/22/2015  . Hypertension   . Acute diverticulitis 11/08/2014  . Diabetes ([place]) 11/08/2014  . Renal insufficiency 11/08/2014  . Abdominal pain 11/08/2014  . Left leg weakness 04/10/2011  . Abnormal gait 04/10/2011  . S/P total knee replacement 12/28/2010  . Knee pain 12/28/2010    Floria Raveling. Hartnett-Rands, MS, PT [facility] #[id number] 02/26/2018, 12:19 PM  [facility] [address] [place],  [place], [address] Phone: [phone]   Fax:  [phone]  Name: ANALEIA ISMAEL MRN: [id number] Date of Birth: 1955/08/27

## 2018-02-27 ENCOUNTER — Telehealth (HOSPITAL_COMMUNITY): Payer: Self-pay | Admitting: Internal Medicine

## 2018-02-27 ENCOUNTER — Encounter: Payer: Self-pay | Admitting: Physician Assistant

## 2018-02-27 ENCOUNTER — Ambulatory Visit: Payer: Medicare HMO | Admitting: Physician Assistant

## 2018-02-27 VITALS — BP 153/89 | HR 86 | Resp 14 | Ht 63.0 in | Wt 154.0 lb

## 2018-02-27 DIAGNOSIS — M25511 Pain in right shoulder: Secondary | ICD-10-CM

## 2018-02-27 DIAGNOSIS — R5382 Chronic fatigue, unspecified: Secondary | ICD-10-CM | POA: Diagnosis not present

## 2018-02-27 DIAGNOSIS — Z8639 Personal history of other endocrine, nutritional and metabolic disease: Secondary | ICD-10-CM

## 2018-02-27 DIAGNOSIS — M7061 Trochanteric bursitis, right hip: Secondary | ICD-10-CM | POA: Diagnosis not present

## 2018-02-27 DIAGNOSIS — F5101 Primary insomnia: Secondary | ICD-10-CM

## 2018-02-27 DIAGNOSIS — M533 Sacrococcygeal disorders, not elsewhere classified: Secondary | ICD-10-CM

## 2018-02-27 DIAGNOSIS — R69 Illness, unspecified: Secondary | ICD-10-CM | POA: Diagnosis not present

## 2018-02-27 DIAGNOSIS — Z8719 Personal history of other diseases of the digestive system: Secondary | ICD-10-CM | POA: Diagnosis not present

## 2018-02-27 DIAGNOSIS — I10 Essential (primary) hypertension: Secondary | ICD-10-CM

## 2018-02-27 DIAGNOSIS — M797 Fibromyalgia: Secondary | ICD-10-CM

## 2018-02-27 DIAGNOSIS — Z8673 Personal history of transient ischemic attack (TIA), and cerebral infarction without residual deficits: Secondary | ICD-10-CM

## 2018-02-27 DIAGNOSIS — N289 Disorder of kidney and ureter, unspecified: Secondary | ICD-10-CM

## 2018-02-27 DIAGNOSIS — M7062 Trochanteric bursitis, left hip: Secondary | ICD-10-CM

## 2018-02-27 DIAGNOSIS — G8929 Other chronic pain: Secondary | ICD-10-CM

## 2018-02-27 MED ORDER — DICLOFENAC SODIUM 1 % TD GEL: TRANSDERMAL | 3 refills | Status: AC

## 2018-02-27 NOTE — Telephone Encounter (Signed)
02/27/18  pt called and said she just wanted to cx this appt

## 2018-02-28 ENCOUNTER — Ambulatory Visit (HOSPITAL_COMMUNITY): Payer: Medicare HMO

## 2018-03-03 DIAGNOSIS — I639 Cerebral infarction, unspecified: Secondary | ICD-10-CM | POA: Diagnosis not present

## 2018-03-05 ENCOUNTER — Encounter (HOSPITAL_COMMUNITY): Payer: Self-pay | Admitting: Occupational Therapy

## 2018-03-05 ENCOUNTER — Encounter (HOSPITAL_COMMUNITY): Payer: Self-pay

## 2018-03-05 ENCOUNTER — Other Ambulatory Visit: Payer: Self-pay

## 2018-03-05 ENCOUNTER — Ambulatory Visit (HOSPITAL_COMMUNITY): Payer: Medicare HMO | Attending: Internal Medicine

## 2018-03-05 ENCOUNTER — Ambulatory Visit (HOSPITAL_COMMUNITY): Payer: Medicare HMO | Admitting: Occupational Therapy

## 2018-03-05 DIAGNOSIS — R29898 Other symptoms and signs involving the musculoskeletal system: Secondary | ICD-10-CM | POA: Insufficient documentation

## 2018-03-05 DIAGNOSIS — I69351 Hemiplegia and hemiparesis following cerebral infarction affecting right dominant side: Secondary | ICD-10-CM | POA: Insufficient documentation

## 2018-03-05 DIAGNOSIS — R2681 Unsteadiness on feet: Secondary | ICD-10-CM | POA: Diagnosis not present

## 2018-03-05 DIAGNOSIS — R278 Other lack of coordination: Secondary | ICD-10-CM

## 2018-03-05 DIAGNOSIS — M25511 Pain in right shoulder: Secondary | ICD-10-CM | POA: Insufficient documentation

## 2018-03-05 DIAGNOSIS — M6281 Muscle weakness (generalized): Secondary | ICD-10-CM | POA: Insufficient documentation

## 2018-03-05 DIAGNOSIS — R262 Difficulty in walking, not elsewhere classified: Secondary | ICD-10-CM | POA: Diagnosis not present

## 2018-03-05 NOTE — Therapy (Signed)
[place], [place], [address] Phone: [phone]   Fax:  [phone]  Physical Therapy Treatment  Patient Details  Name: [name] MRN: [id number] Date of Birth: 04-18-1956 Referring Provider (PT): Celene Squibb, MD   Encounter Date: 03/05/2018  PT End of Session - 03/05/18 1037    Visit Number  6    Number of Visits  17    Date for PT Re-Evaluation  04/09/18   Minireassess 03/12/18   Authorization Type  [employer] Medicare HMO    Authorization Time Period  02/12/18 to 04/09/18    Authorization - Visit Number  6    Authorization - Number of Visits  10    PT Start Time  1030    PT Stop Time  1111    PT Time Calculation (min)  41 min    Equipment Utilized During Treatment  Gait belt    Activity Tolerance  Patient tolerated treatment well;Patient limited by fatigue;Patient limited by pain   Rt UE pain   Behavior During Therapy  [facility] for tasks assessed/performed       Past Medical History:  Diagnosis Date  . Diabetes mellitus without complication ([place])   . Diverticulitis   . Fibromyalgia   . Hyperlipidemia   . Hypertension   . Papule 03/22/2015  . Stroke [facility])     Past Surgical History:  Procedure Laterality Date  . CHOLECYSTECTOMY    . KNEE SURGERY Left     There were no vitals filed for this visit.  Subjective Assessment - 03/05/18 1037    Subjective  Pt states that her legs are doing fine but her R shoulder is killing her today. She has her OT eval for her R shoulder after her PT sessoin today.     Patient Stated Goals  be able to walk better    Currently in Pain?  Yes    Pain Score  10-Worst pain ever    Pain Location  Shoulder    Pain Orientation  Right    Pain Descriptors / Indicators  Aching    Pain Type  Acute pain    Pain Onset  More than a month ago    Pain Frequency  Intermittent    Aggravating Factors   working her arm    Pain Relieving Factors  gel    Effect of Pain on Daily  Activities  increases           [facility] Adult PT Treatment/Exercise - 03/05/18 0001      Ambulation/Gait   Ambulation Distance (Feet)  100 Feet    Assistive device  Hemi-walker;Other (Comment)   w/C follow     Exercises   Exercises  Knee/Hip      Knee/Hip Exercises: Aerobic   Nustep  x5 mins, L1, seat 7, BLE and LUE for reciprocal gait pattern      Knee/Hip Exercises: Standing   Hip Abduction  Both;10 reps    Abduction Limitations  LUE support      Knee/Hip Exercises: Seated   Heel Slides  Right;2 sets;10 reps    Heel Slides Limitations  for HS strengthening    Marching  Both;10 reps    Abd/Adduction Limitations  sitting on disc +LAQ, paloff press x10 with LLE extended      Balance Exercises - 03/05/18 1113      Balance Exercises: Standing   Partial Tandem Stance  Eyes open;Intermittent upper extremity support   +R/L head  turns x10 reps each           PT Education - 03/05/18 1037    Education Details  exercise technique    Person(s) Educated  Patient    Methods  Explanation;Demonstration    Comprehension  Verbalized understanding;Returned demonstration       PT Short Term Goals - 02/26/18 1028      PT SHORT TERM GOAL #1   Title  Pt will be independent with HEP and perform consistently in order to maximize strength, balance, and independence at home.    Time  4    Period  Weeks    Status  Partially Met      PT SHORT TERM GOAL #2   Title  Pt will have improved MMT by 1/2 grade in order to maximize transfers, gait, and balance.    Time  4    Period  Weeks    Status  On-going      PT SHORT TERM GOAL #3   Title  Pt will have improved BERG balance score to 40/56 to demo improved balance and decreased risk for falls.    Time  4    Period  Weeks    Status  On-going        PT Long Term Goals - 02/26/18 1028      PT LONG TERM GOAL #1   Title  Pt will have improved MMT by 1 grade throughout in order to further maximize functional mobility, balance, and  decrease her risk for falls.    Time  8    Period  Weeks    Status  On-going      PT LONG TERM GOAL #2   Title  Pt will have improved BERG balance score to 46/56 in order to further demo improved balance and decreased risk for falls.    Time  8    Period  Weeks    Status  On-going      PT LONG TERM GOAL #3   Title  Pt will be able to perform the TUG in 45sec or better with LRAD to demo improved functional mobility and balance in order to maximize [facility] ambulation.     Time  8    Period  Weeks    Status  On-going      PT LONG TERM GOAL #4   Title  Pt will have improved 5xSTS to 15sec or < without UE to demo improved functional strength and mobility.    Time  8    Period  Weeks    Status  On-going      PT LONG TERM GOAL #5   Title  Pt will have improved 3MWT by 146f or > with LRAD, mod I, and without evidence of R toe drag in order to demo improved functional mobility and maximize her community ambulation.     Time  8    Period  Weeks    Status  On-going            Plan - 03/05/18 1114    Clinical Impression Statement  Began session on Nustep with BLE and LUE to promote reciprocal gait training. Rest of session focused on functional strength and balance training. Pt noted to have R knee hyperextension during gait, which PT feels is likely due to weak R HS so performed HS strengthening this date and updated HEP as well; cued pt to decrease hyperextension during strengthening. CGA during balance, especially with RLE back. No increased  pain reported during session. Continue as planned, progressing as able.     Rehab Potential  Good    PT Frequency  2x / week    PT Duration  8 weeks    PT Treatment/Interventions  ADLs/Self Care Home Management;Aquatic Therapy;Cryotherapy;Electrical Stimulation;Moist Heat;Ultrasound;DME Instruction;Gait training;Functional mobility Scientist, forensic;Therapeutic activities;Therapeutic exercise;Balance training;Neuromuscular  re-education;Patient/family education;Orthotic Fit/Training;Manual techniques;Wheelchair mobility training;Scar mobilization;Passive range of motion;Dry needling;Energy conservation;Taping    PT Next Visit Plan  Continue BLE and functional strengthening and balance work.    PT Home Exercise Plan  eval: bridging, supine march, supine clams RTB 10/22: adduction, LAQ, heel/toe raises; 11/5: seated heel slides, supine heel slides, standing knee flexion    Consulted and Agree with Plan of Care  Patient    Family Member Consulted  husband       Patient will benefit from skilled therapeutic intervention in order to improve the following deficits and impairments:  Abnormal gait, Decreased activity tolerance, Decreased balance, Decreased coordination, Decreased endurance, Decreased mobility, Decreased range of motion, Decreased strength, Difficulty walking, Increased muscle spasms, Impaired flexibility, Impaired UE functional use  Visit Diagnosis: Muscle weakness (generalized)  Difficulty in walking, not elsewhere classified  Unsteadiness on feet     Problem List Patient Active Problem List   Diagnosis Date Noted  . Hypothyroidism 11/29/2017  . Cerebrovascular accident (CVA) ([place])   . Ischemic stroke ([place])   . Type 2 diabetes mellitus with hyperglycemia ([place]) 11/25/2017  . HLD (hyperlipidemia) 11/25/2017  . Fibromyalgia 11/25/2017  . Depression 11/25/2017  . Tobacco abuse 11/25/2017  . Papule 03/22/2015  . Hypertension   . Acute diverticulitis 11/08/2014  . Diabetes ([place]) 11/08/2014  . Renal insufficiency 11/08/2014  . Abdominal pain 11/08/2014  . Left leg weakness 04/10/2011  . Abnormal gait 04/10/2011  . S/P total knee replacement 12/28/2010  . Knee pain 12/28/2010        Geraldine Solar PT, DPT  [place] [address] [place], [place], [address] Phone: [phone]   Fax:  [phone]  Name: [name] MRN:  [id number] Date of Birth: Mar 24, 1956

## 2018-03-05 NOTE — Therapy (Addendum)
[facility] [address] [place], [place], [address] Phone: [phone]   Fax:  [phone]  Occupational Therapy Evaluation  Patient Details  Name: [name] MRN: [id number] Date of Birth: 10/16/1955 Referring Provider (OT): Nita Sells, MD   Encounter Date: 03/05/2018  OT End of Session - 03/05/18 1206    Visit Number  1    Number of Visits  8    Date for OT Re-Evaluation  04/02/18    Authorization Type  1) [employer] Medicare HMO; $40 copay    OT Start Time  1117    OT Stop Time  1158    OT Time Calculation (min)  41 min    Activity Tolerance  Patient tolerated treatment well    Behavior During Therapy  [facility] for tasks assessed/performed       Past Medical History:  Diagnosis Date  . Diabetes mellitus without complication ([facility])   . Diverticulitis   . Fibromyalgia   . Hyperlipidemia   . Hypertension   . Papule 03/22/2015  . Stroke [facility])     Past Surgical History:  Procedure Laterality Date  . CHOLECYSTECTOMY    . KNEE SURGERY Left     There were no vitals filed for this visit.  Subjective Assessment - 03/05/18 1139    Subjective   S: I want to be able to use my right arm more.     Pertinent History  Patient is a 62 year old female presenting with right-sided hemiparesis following CVA 11/25/17. Pt received OT services at [facility] and was discharged on 02/07/18. Patient was referred by Nita Sells, MD for OT evaluation and treatment.    Patient Stated Goals  To be as independent with ADLs as possible.     Currently in Pain?  Yes    Pain Score  10-Worst pain ever    Pain Location  Shoulder    Pain Orientation  Right    Pain Descriptors / Indicators  Constant    Pain Type  Acute pain    Pain Radiating Towards  N/A    Pain Onset  More than a month ago    Pain Frequency  Constant    Aggravating Factors   N/A    Pain Relieving Factors  Pain relief gel    Effect of Pain on Daily Activities  Max effect on daily  activities.     Multiple Pain Sites  No        [facility] OT Assessment - 03/05/18 1123      Assessment   Medical Diagnosis  Right hemiparesis s/p CVA    Referring Provider (OT)  Nita Sells, MD    Onset Date/Surgical Date  11/25/17    Hand Dominance  Right    Next MD Visit  November 2019     Prior Therapy  [facility] OT (d/c on 02/07/18)      Precautions   Precautions  Fall      Balance Screen   Has the patient fallen in the past 6 months  Yes    How many times?  2    Has the patient had a decrease in activity level because of a fear of falling?   Yes    Is the patient reluctant to leave their home because of a fear of falling?   Yes      Prior Function   Level of Independence  Independent    Vocation  Retired    Leisure  Crafts, walking, "whatever I like to do."    Comments  --      ADL   ADL comments  Husband and niece, that visits three times a week, assist with bathing and dressing. Patient reports that she needs assistance washing her hair and getting into and out of the shower for safety and to prevent falls. Patient reports that she tries to use her left hand as much as possible in the shower and while dressing. Pt requires assistance donning and doffing shoes.          Mobility   Mobility Status  Needs assist      Written Expression   Dominant Hand  Right      Vision - History   Baseline Vision  Wears glasses only for reading      Cognition   Overall Cognitive Status  Within Functional Limits for tasks assessed      Observation/Other Assessments   Focus on Therapeutic Outcomes (FOTO)   45.63%, limited 54% (completed with PT 02/14/18      Sensation   Light Touch  Appears Intact    Additional Comments  Reports pins and needles intermittenly in right hand.       Tone   Assessment Location  Right Upper Extremity      ROM / Strength   AROM / PROM / Strength  Strength;PROM      PROM   Overall PROM   Unable to assess;Due to pain      Strength   Overall  Strength  Due to pain;Unable to assess      Hand Function   Right Hand Gross Grasp  Impaired    Comment  Unable to grasp using dominant RUE due to hemiparesis.       RUE Tone   RUE Tone  Other (Comment)   Increase in flexion-passive movement with mod resistance     RUE Tone   Hypertonic Details  Dominant RUE presenting with spasticity and fingers in flexion. Pt able to manually extend fingers using left hand.                       OT Education - 03/05/18 1205    Education Details   Compensatory techniques for dressing and bathing, body positioning, and wrist, finger and thumb stretches.     Person(s) Educated  Patient    Methods  Explanation;Demonstration;Handout;Verbal cues    Comprehension  Verbalized understanding;Returned demonstration       OT Short Term Goals - 03/05/18 1759      OT SHORT TERM GOAL #1   Title  Patient will be educated and independent with HEP to faciliate progress in therapy and allow her to increase functional independence with ADLs.     Time  4    Period  Weeks    Status  New    Target Date  04/02/18      OT SHORT TERM GOAL #2   Title  Patient will become educated and independent with compensatory techniques and assistive devices that will enable her to improve independence and safety during daily tasks.     Time  4    Period  Weeks    Status  New      OT SHORT TERM GOAL #3   Title  Patient will be educated on energy conservation techniques in order to be able to complete daily tasks with improved activity tolerance.     Time  4  Period  Weeks    Status  New      OT SHORT TERM GOAL #4   Title  Patient will decrease right shoulder pain to 6/10 or lower for improved quality of life.    Time  4    Period  Weeks    Status  New      OT SHORT TERM GOAL #5   Title  Patient will become educated on adaptive strategies to incorportate RUE to assist with bilateral functional tasks.     Time  4    Period  Weeks    Status  New                Plan - 03/05/18 1208    Clinical Impression Statement  A: Patient is a 62 year old female presenting with right-sided hemiparesis following CVA 11/25/17. Patient presents with right shoulder pain and increased flexor tone noted in right UE. Patient is unable to use dominant RUE and needs assistance with basic ADLs.       Occupational Profile and client history currently impacting functional performance  Pt is motivated to return to highest level of functional independence.     Occupational performance deficits (Please refer to evaluation for details):  ADL's;IADL's;Leisure;Rest and Sleep    Rehab Potential  Good    OT Frequency  2x / week    OT Duration  4 weeks    OT Treatment/Interventions  Self-care/ADL training;Therapeutic exercise;Ultrasound;Neuromuscular education;Manual Therapy;Splinting;Energy conservation;Therapeutic activities;Coping strategies training;DME and/or AE instruction;Electrical Stimulation;Passive range of motion;Patient/family education    Plan  P: Patient will benefit from skilled OT services in order to decrease pain and become educated on compensatory techniques to increase functional and safe independence. Treatment plan: Manual PRN, Modalities PRN, compensatory techniques, DME and or AE instruction, energy conservation, P/ROM, A/AROM, fall prevention education. Check for shoulder subluxation next session.     Clinical Decision Making  Multiple treatment options, significant modification of task necessary    OT Home Exercise Plan  Compensatory techniques for dressing and bathing. Body positioning education. Wrist, finger and thumb stretches.     Consulted and Agree with Plan of Care  Patient       Patient will benefit from skilled therapeutic intervention in order to improve the following deficits and impairments:  Decreased activity tolerance, Decreased knowledge of use of DME, Decreased strength, Impaired flexibility, Decreased mobility, Decreased  range of motion, Pain, Decreased coordination, Increased fascial restrictions, Impaired UE functional use, Impaired tone  Visit Diagnosis: Acute pain of right shoulder  Other symptoms and signs involving the musculoskeletal system  Hemiplegia and hemiparesis following cerebral infarction affecting right dominant side [facility])    Problem List Patient Active Problem List   Diagnosis Date Noted  . Hypothyroidism 11/29/2017  . Cerebrovascular accident (CVA) ([facility])   . Ischemic stroke ([facility])   . Type 2 diabetes mellitus with hyperglycemia ([facility]) 11/25/2017  . HLD (hyperlipidemia) 11/25/2017  . Fibromyalgia 11/25/2017  . Depression 11/25/2017  . Tobacco abuse 11/25/2017  . Papule 03/22/2015  . Hypertension   . Acute diverticulitis 11/08/2014  . Diabetes ([facility]) 11/08/2014  . Renal insufficiency 11/08/2014  . Abdominal pain 11/08/2014  . Left leg weakness 04/10/2011  . Abnormal gait 04/10/2011  . S/P total knee replacement 12/28/2010  . Knee pain 12/28/2010    Vincente Liberty, OT student  03/06/2018, 11:10 AM  [facility] [address] [place], [place], [address] Phone: [phone]   Fax:  [phone]  Name:  [name] MRN: [id number] Date of Birth: March 19, 1956

## 2018-03-05 NOTE — Patient Instructions (Signed)
Handouts provided on compensatory techniques for dressing and bathing, body positioning, and wrist, finger and thumb stretches.

## 2018-03-07 ENCOUNTER — Ambulatory Visit (HOSPITAL_COMMUNITY): Payer: Medicare HMO

## 2018-03-07 ENCOUNTER — Encounter (HOSPITAL_COMMUNITY): Payer: Self-pay

## 2018-03-07 DIAGNOSIS — R278 Other lack of coordination: Secondary | ICD-10-CM | POA: Diagnosis not present

## 2018-03-07 DIAGNOSIS — I69351 Hemiplegia and hemiparesis following cerebral infarction affecting right dominant side: Secondary | ICD-10-CM | POA: Diagnosis not present

## 2018-03-07 DIAGNOSIS — R2681 Unsteadiness on feet: Secondary | ICD-10-CM | POA: Diagnosis not present

## 2018-03-07 DIAGNOSIS — M6281 Muscle weakness (generalized): Secondary | ICD-10-CM | POA: Diagnosis not present

## 2018-03-07 DIAGNOSIS — R262 Difficulty in walking, not elsewhere classified: Secondary | ICD-10-CM | POA: Diagnosis not present

## 2018-03-07 DIAGNOSIS — R29898 Other symptoms and signs involving the musculoskeletal system: Secondary | ICD-10-CM | POA: Diagnosis not present

## 2018-03-07 DIAGNOSIS — M25511 Pain in right shoulder: Secondary | ICD-10-CM | POA: Diagnosis not present

## 2018-03-07 NOTE — Therapy (Signed)
[place], [place], [address] Phone: [phone]   Fax:  [phone]  Physical Therapy Treatment  Patient Details  Name: [name] MRN: [id number] Date of Birth: 26-Nov-1955 Referring Provider (PT): Celene Squibb, MD   Encounter Date: 03/07/2018  PT End of Session - 03/07/18 1120    Visit Number  7    Number of Visits  17    Date for PT Re-Evaluation  04/09/18   Minireassess 03/12/18   Authorization Type  [employer] HMO    Authorization Time Period  02/12/18 to 04/09/18    Authorization - Visit Number  7    Authorization - Number of Visits  10    PT Start Time  1115    PT Stop Time  1200    PT Time Calculation (min)  45 min    Equipment Utilized During Treatment  Gait belt    Activity Tolerance  Patient tolerated treatment well;Patient limited by fatigue;Patient limited by pain   Rt UE pain   Behavior During Therapy  [facility] for tasks assessed/performed       Past Medical History:  Diagnosis Date  . Diabetes mellitus without complication ([place])   . Diverticulitis   . Fibromyalgia   . Hyperlipidemia   . Hypertension   . Papule 03/22/2015  . Stroke [facility])     Past Surgical History:  Procedure Laterality Date  . CHOLECYSTECTOMY    . KNEE SURGERY Left     There were no vitals filed for this visit.  Subjective Assessment - 03/07/18 1120    Subjective  Pt reports that her shoulder isn't quite as painful as it was last session. Her R leg is weak today.     Patient Stated Goals  be able to walk better    Currently in Pain?  Yes    Pain Score  7     Pain Location  Shoulder    Pain Orientation  Right    Pain Descriptors / Indicators  Constant    Pain Type  Acute pain    Pain Onset  More than a month ago    Pain Frequency  Constant    Aggravating Factors   n/a    Pain Relieving Factors  pain relief gel    Effect of Pain on Daily Activities  max effect on daily activities            [facility]  Adult PT Treatment/Exercise - 03/07/18 0001      Ambulation/Gait   Ambulation Distance (Feet)  350 Feet    Assistive device  Hemi-walker;Other (Comment)   w/C follow   Gait Comments  intermittent toe slides with 1 R foot drag which required min A for balance      Exercises   Exercises  Knee/Hip      Knee/Hip Exercises: Aerobic   Nustep  x5 mins, L1, seat 7, BLE and LUE for reciprocal gait pattern   attempted RUE use as well this date but unable due to pain     Knee/Hip Exercises: Standing   Heel Raises  Both;15 reps    Heel Raises Limitations  heel and toe (unable to perform on R due to weakness but mm contracting)    Functional Squat  10 reps    Functional Squat Limitations  1-0UE support    Rebounder  toe tapping 4" step x10 each, LUE support      Knee/Hip Exercises: Seated   Other Seated  Knee/Hip Exercises  R toe raises x10 reps    Abd/Adduction Limitations  seated cone taps RLE x5RT (increased difficulty reaching across midline)       Balance Exercises - 03/07/18 1149      Balance Exercises: Standing   Standing Eyes Closed  Narrow base of support (BOS);Foam/compliant surface;3 reps;10 secs   intermittent UE support   Partial Tandem Stance  Eyes open;Intermittent upper extremity support   +R/L and up/down head turns x10 reps each foot forward   Marching Limitations  x10 firm, LUE support            PT Education - 03/07/18 1120    Education Details  exercise technique, continue HEP    Person(s) Educated  Patient    Methods  Explanation;Demonstration    Comprehension  Verbalized understanding;Returned demonstration       PT Short Term Goals - 02/26/18 1028      PT SHORT TERM GOAL #1   Title  Pt will be independent with HEP and perform consistently in order to maximize strength, balance, and independence at home.    Time  4    Period  Weeks    Status  Partially Met      PT SHORT TERM GOAL #2   Title  Pt will have improved MMT by 1/2 grade in order to  maximize transfers, gait, and balance.    Time  4    Period  Weeks    Status  On-going      PT SHORT TERM GOAL #3   Title  Pt will have improved BERG balance score to 40/56 to demo improved balance and decreased risk for falls.    Time  4    Period  Weeks    Status  On-going        PT Long Term Goals - 02/26/18 1028      PT LONG TERM GOAL #1   Title  Pt will have improved MMT by 1 grade throughout in order to further maximize functional mobility, balance, and decrease her risk for falls.    Time  8    Period  Weeks    Status  On-going      PT LONG TERM GOAL #2   Title  Pt will have improved BERG balance score to 46/56 in order to further demo improved balance and decreased risk for falls.    Time  8    Period  Weeks    Status  On-going      PT LONG TERM GOAL #3   Title  Pt will be able to perform the TUG in 45sec or better with LRAD to demo improved functional mobility and balance in order to maximize [facility] ambulation.     Time  8    Period  Weeks    Status  On-going      PT LONG TERM GOAL #4   Title  Pt will have improved 5xSTS to 15sec or < without UE to demo improved functional strength and mobility.    Time  8    Period  Weeks    Status  On-going      PT LONG TERM GOAL #5   Title  Pt will have improved 3MWT by 194f or > with LRAD, mod I, and without evidence of R toe drag in order to demo improved functional mobility and maximize her community ambulation.     Time  8    Period  Weeks    Status  On-going            Plan - 03/07/18 1206    Clinical Impression Statement  Session focused on gait, strength, and balance. Pt able to ambulate 361f with hemiwalker before requiring rest break; only 1 bout of mod-major R toe drag towards end of amb and required min A to maintain balance. Attempted step ups this date but due to incoordination of RLE, unsafe to continue as she kept placing it across midline when stepping down with it. Performed toe taps in standing to 4"  step and then cone taps seated for coordination of RLE; pt very challenged with cone taps seated, especially when having to reach across midline. Continued with staggered stance and head turns and added NBOS balance on foam with EC; pt generally unsteady but no overt LOB noted. PT educated her that she is likely going to be sore from today's session due to progression. Continue as planned, progressing as able    Rehab Potential  Good    PT Frequency  2x / week    PT Duration  8 weeks    PT Treatment/Interventions  ADLs/Self Care Home Management;Aquatic Therapy;Cryotherapy;Electrical Stimulation;Moist Heat;Ultrasound;DME Instruction;Gait training;Functional mobility tScientist, forensicTherapeutic activities;Therapeutic exercise;Balance training;Neuromuscular re-education;Patient/family education;Orthotic Fit/Training;Manual techniques;Wheelchair mobility training;Scar mobilization;Passive range of motion;Dry needling;Energy conservation;Taping    PT Next Visit Plan  Continue BLE and functional strengthening, balance work, R HS and gastroc strengthening, and coordination of RLE    PT Home Exercise Plan  eval: bridging, supine march, supine clams RTB 10/22: adduction, LAQ, heel/toe raises; 11/5: seated heel slides, supine heel slides, standing knee flexion    Consulted and Agree with Plan of Care  Patient       Patient will benefit from skilled therapeutic intervention in order to improve the following deficits and impairments:  Abnormal gait, Decreased activity tolerance, Decreased balance, Decreased coordination, Decreased endurance, Decreased mobility, Decreased range of motion, Decreased strength, Difficulty walking, Increased muscle spasms, Impaired flexibility, Impaired UE functional use  Visit Diagnosis: Muscle weakness (generalized)  Difficulty in walking, not elsewhere classified  Unsteadiness on feet     Problem List Patient Active Problem List   Diagnosis Date Noted  .  Hypothyroidism 11/29/2017  . Cerebrovascular accident (CVA) ([place]   . Ischemic stroke ([place]   . Type 2 diabetes mellitus with hyperglycemia ([place] 11/25/2017  . HLD (hyperlipidemia) 11/25/2017  . Fibromyalgia 11/25/2017  . Depression 11/25/2017  . Tobacco abuse 11/25/2017  . Papule 03/22/2015  . Hypertension   . Acute diverticulitis 11/08/2014  . Diabetes ([place] 11/08/2014  . Renal insufficiency 11/08/2014  . Abdominal pain 11/08/2014  . Left leg weakness 04/10/2011  . Abnormal gait 04/10/2011  . S/P total knee replacement 12/28/2010  . Knee pain 12/28/2010         BGeraldine SolarPT, DPT   [place] [address]Phone: [phone]  Fax:  [phone] Name: VODESSIE POLZINMRN: [id number] of Birth: 5June 26, 1957

## 2018-03-08 DIAGNOSIS — E1165 Type 2 diabetes mellitus with hyperglycemia: Secondary | ICD-10-CM | POA: Diagnosis not present

## 2018-03-08 DIAGNOSIS — I10 Essential (primary) hypertension: Secondary | ICD-10-CM | POA: Diagnosis not present

## 2018-03-08 DIAGNOSIS — R946 Abnormal results of thyroid function studies: Secondary | ICD-10-CM | POA: Diagnosis not present

## 2018-03-08 DIAGNOSIS — E782 Mixed hyperlipidemia: Secondary | ICD-10-CM | POA: Diagnosis not present

## 2018-03-08 DIAGNOSIS — Z6831 Body mass index (BMI) 31.0-31.9, adult: Secondary | ICD-10-CM | POA: Diagnosis not present

## 2018-03-12 ENCOUNTER — Encounter (HOSPITAL_COMMUNITY): Payer: Self-pay

## 2018-03-12 ENCOUNTER — Ambulatory Visit (HOSPITAL_COMMUNITY): Payer: Medicare HMO

## 2018-03-12 DIAGNOSIS — M6281 Muscle weakness (generalized): Secondary | ICD-10-CM | POA: Diagnosis not present

## 2018-03-12 DIAGNOSIS — R278 Other lack of coordination: Secondary | ICD-10-CM | POA: Diagnosis not present

## 2018-03-12 DIAGNOSIS — R29898 Other symptoms and signs involving the musculoskeletal system: Secondary | ICD-10-CM | POA: Diagnosis not present

## 2018-03-12 DIAGNOSIS — M25511 Pain in right shoulder: Secondary | ICD-10-CM | POA: Diagnosis not present

## 2018-03-12 DIAGNOSIS — R262 Difficulty in walking, not elsewhere classified: Secondary | ICD-10-CM

## 2018-03-12 DIAGNOSIS — R2681 Unsteadiness on feet: Secondary | ICD-10-CM | POA: Diagnosis not present

## 2018-03-12 DIAGNOSIS — I69351 Hemiplegia and hemiparesis following cerebral infarction affecting right dominant side: Secondary | ICD-10-CM | POA: Diagnosis not present

## 2018-03-12 NOTE — Therapy (Signed)
[facility] [address] [place], [place], [address] Phone: [phone]   Fax:  [phone]   Progress Note Reporting Period 02/12/18 to 03/12/18  See note below for Objective Data and Assessment of Progress/Goals.    Physical Therapy Treatment  Patient Details  Name: [name] MRN: [id number] Date of Birth: Jul 18, 1955 Referring Provider (PT): Benita Stabile, MD   Encounter Date: 03/12/2018  PT End of Session - 03/12/18 1031    Visit Number  8    Number of Visits  17    Date for PT Re-Evaluation  04/09/18   Minireassess completed 03/12/18   Authorization Type  [employer] Medicare HMO    Authorization Time Period  02/12/18 to 04/09/18    Authorization - Visit Number  8    Authorization - Number of Visits  17    PT Start Time  1030    PT Stop Time  1116    PT Time Calculation (min)  46 min    Equipment Utilized During Treatment  Gait belt    Activity Tolerance  Patient tolerated treatment well;Patient limited by fatigue;Patient limited by pain   Rt UE pain   Behavior During Therapy  WFL for tasks assessed/performed       Past Medical History:  Diagnosis Date  . Diabetes mellitus without complication ([facility])   . Diverticulitis   . Fibromyalgia   . Hyperlipidemia   . Hypertension   . Papule 03/22/2015  . Stroke [facility])     Past Surgical History:  Procedure Laterality Date  . CHOLECYSTECTOMY    . KNEE SURGERY Left     There were no vitals filed for this visit.  Subjective Assessment - 03/12/18 1032    Subjective  Pt states that she had a good weekend. She has about a 5/10 pain in her shoulder today.    Patient Stated Goals  be able to walk better    Currently in Pain?  Yes    Pain Score  5     Pain Location  Shoulder    Pain Orientation  Right    Pain Descriptors / Indicators  Constant    Pain Type  Acute pain    Pain Onset  More than a month ago    Pain Frequency  Constant    Aggravating Factors   n/a    Pain  Relieving Factors  pain relief gel    Effect of Pain on Daily Activities  max effect on daily activities           [facility] PT Assessment - 03/12/18 0001      Assessment   Medical Diagnosis  Right hemiparesis s/p CVA    Referring Provider (PT)  Benita Stabile, MD    Onset Date/Surgical Date  11/25/17    Hand Dominance  Right    Next MD Visit  03/15/18    Prior Therapy  [facility] and [facility] (d/c on 02/07/18)      Precautions   Precautions  Fall      Strength   Right Hip Flexion  4/5   was 4   Right Hip Extension  2+/5   based on functional assessment; was 2+ in prone   Right Hip ABduction  4/5   based on functional assessment today; was 4 in sidelying   Left Hip Flexion  4+/5   was 4+   Left Hip Extension  3/5   based on functional assessment; was 3 in prone  Left Hip ABduction  4+/5   based on functional assessment today; was 4+ in sidelying   Right Knee Flexion  4-/5   in sitting today; was 3 in prone   Right Knee Extension  4+/5   was 4+   Left Knee Flexion  4+/5   in sitting today; was 4+ in prone   Left Knee Extension  4+/5   was 4+   Right Ankle Dorsiflexion  3/5   was 3   Left Ankle Dorsiflexion  4+/5   was 4+     Ambulation/Gait   Ambulation Distance (Feet)  138 Feet   ; was 112ft hemi walker   Assistive device  Hemi-walker;Other (Comment)   w/C follow   Gait Comments  multiple toe drags, 1 requiring assistance to prevent fall      Standardized Balance Assessment   Standardized Balance Assessment  Berg Balance Test    Five times sit to stand comments   23.6sec, from standard chair, 1 UE support;    was 35 sec from w/c with LUE assistance     Berg Balance Test   Sit to Stand  Able to stand using hands after several tries    Standing Unsupported  Able to stand 2 minutes with supervision    Sitting with Back Unsupported but Feet Supported on Floor or Stool  Able to sit safely and securely 2 minutes    Stand to Sit  Controls descent by using hands     Transfers  Able to transfer safely, definite need of hands    Standing Unsupported with Eyes Closed  Able to stand 10 seconds with supervision    Standing Ubsupported with Feet Together  Able to place feet together independently and stand for 1 minute with supervision    From Standing, Reach Forward with Outstretched Arm  Can reach forward >12 cm safely (5")    From Standing Position, Pick up Object from Floor  Able to pick up shoe, needs supervision    From Standing Position, Turn to Look Behind Over each Shoulder  Needs supervision when turning    Turn 360 Degrees  Able to turn 360 degrees safely but slowly    Standing Unsupported, Alternately Place Feet on Step/Stool  Needs assistance to keep from falling or unable to try    Standing Unsupported, One Foot in Front  Needs help to step but can hold 15 seconds    Standing on One Leg  Tries to lift leg/unable to hold 3 seconds but remains standing independently    Total Score  32    Berg comment:  was 29/56      Timed Up and Go Test   TUG  Normal TUG    Normal TUG (seconds)  50    TUG Comments  hemi walker   was 1:16 with hemiwalker           PT Education - 03/12/18 1032    Education Details  reassessment findings, continue HEP    Person(s) Educated  Patient    Methods  Explanation;Demonstration    Comprehension  Verbalized understanding;Returned demonstration        PT Short Term Goals - 03/12/18 1035      PT SHORT TERM GOAL #1   Title  Pt will be independent with HEP and perform consistently in order to maximize strength, balance, and independence at home.    Time  4    Period  Weeks    Status  Achieved  PT SHORT TERM GOAL #2   Title  Pt will have improved MMT by 1/2 grade in order to maximize transfers, gait, and balance.    Time  4    Period  Weeks    Status  On-going      PT SHORT TERM GOAL #3   Title  Pt will have improved BERG balance score to 40/56 to demo improved balance and decreased risk for falls.     Time  4    Period  Weeks    Status  On-going        PT Long Term Goals - 03/12/18 1036      PT LONG TERM GOAL #1   Title  Pt will have improved MMT by 1 grade throughout in order to further maximize functional mobility, balance, and decrease her risk for falls.    Time  8    Period  Weeks    Status  On-going      PT LONG TERM GOAL #2   Title  Pt will have improved BERG balance score to 46/56 in order to further demo improved balance and decreased risk for falls.    Time  8    Period  Weeks    Status  On-going      PT LONG TERM GOAL #3   Title  Pt will be able to perform the TUG in 45sec or better with LRAD to demo improved functional mobility and balance in order to maximize [facility] ambulation.     Time  8    Period  Weeks    Status  On-going      PT LONG TERM GOAL #4   Title  Pt will have improved 5xSTS to 15sec or < without UE to demo improved functional strength and mobility.    Time  8    Period  Weeks    Status  On-going      PT LONG TERM GOAL #5   Title  Pt will have improved by 133ft or > with LRAD, mod I, and without evidence of R toe drag in order to demo improved functional mobility and maximize her community ambulation.     Time  8    Period  Weeks    Status  On-going            Plan - 03/12/18 1212    Clinical Impression Statement  PT reassessed pt's goals and outcome measures this date. Pt has made good progress towards goals thus far as illustrated above. Her BERG has improved to 32/56 (was 29), 5xSTS, TUG, and all improved indicating improved balance, functional strength, and functional mobility. Her MMT has remained relatively unchanged, mainly on the RLE. She had multiple R toe drags during gait, with 1 moderate to severe almost causing a fall but PT able to assist pt to prevent. PT educated pt that a referral for an AFO will likely be sent to her referring physician, Dr. Margo Aye, in order to reduce the amount of toe drag during gait to decrease her  risk for falls. An AFO would also facilitate reduced R knee hyperextension. Overall, pt progressing very well and needs continued skilled PT intervention to further maximize strength, balance, and functional mobility in order to improve QOL and independence at home.    Rehab Potential  Good    PT Frequency  2x / week    PT Duration  8 weeks    PT Treatment/Interventions  ADLs/Self Care Home Management;Aquatic Therapy;Cryotherapy;Lobbyist  Stimulation;Moist Heat;Ultrasound;DME Instruction;Gait training;Functional mobility training;Stair training;Therapeutic activities;Therapeutic exercise;Balance training;Neuromuscular re-education;Patient/family education;Orthotic Fit/Training;Manual techniques;Wheelchair mobility training;Scar mobilization;Passive range of motion;Dry needling;Energy conservation;Taping    PT Next Visit Plan  f/u on AFO referral for RLE; Continue BLE and functional strengthening, balance work, R HS and gastroc strengthening, and coordination of RLE    PT Home Exercise Plan  eval: bridging, supine march, supine clams RTB 10/22: adduction, LAQ, heel/toe raises; 11/5: seated heel slides, supine heel slides, standing knee flexion    Consulted and Agree with Plan of Care  Patient       Patient will benefit from skilled therapeutic intervention in order to improve the following deficits and impairments:  Abnormal gait, Decreased activity tolerance, Decreased balance, Decreased coordination, Decreased endurance, Decreased mobility, Decreased range of motion, Decreased strength, Difficulty walking, Increased muscle spasms, Impaired flexibility, Impaired UE functional use  Visit Diagnosis: Muscle weakness (generalized)  Difficulty in walking, not elsewhere classified  Unsteadiness on feet     Problem List Patient Active Problem List   Diagnosis Date Noted  . Hypothyroidism 11/29/2017  . Cerebrovascular accident (CVA) ([facility])   . Ischemic stroke ([facility])   . Type 2 diabetes mellitus  with hyperglycemia ([facility]) 11/25/2017  . HLD (hyperlipidemia) 11/25/2017  . Fibromyalgia 11/25/2017  . Depression 11/25/2017  . Tobacco abuse 11/25/2017  . Papule 03/22/2015  . Hypertension   . Acute diverticulitis 11/08/2014  . Diabetes ([facility]) 11/08/2014  . Renal insufficiency 11/08/2014  . Abdominal pain 11/08/2014  . Left leg weakness 04/10/2011  . Abnormal gait 04/10/2011  . S/P total knee replacement 12/28/2010  . Knee pain 12/28/2010        Jac Canavan PT, DPT  [facility] [address] [place], [place], [address] Phone: [phone]   Fax:  [phone]  Name: [name] MRN: [id number] Date of Birth: Jul 18, 1955

## 2018-03-13 ENCOUNTER — Ambulatory Visit (HOSPITAL_COMMUNITY): Payer: Medicare HMO | Admitting: Occupational Therapy

## 2018-03-13 ENCOUNTER — Encounter (HOSPITAL_COMMUNITY): Payer: Self-pay | Admitting: Occupational Therapy

## 2018-03-13 DIAGNOSIS — R2681 Unsteadiness on feet: Secondary | ICD-10-CM | POA: Diagnosis not present

## 2018-03-13 DIAGNOSIS — M25511 Pain in right shoulder: Secondary | ICD-10-CM

## 2018-03-13 DIAGNOSIS — I69351 Hemiplegia and hemiparesis following cerebral infarction affecting right dominant side: Secondary | ICD-10-CM | POA: Diagnosis not present

## 2018-03-13 DIAGNOSIS — R29898 Other symptoms and signs involving the musculoskeletal system: Secondary | ICD-10-CM

## 2018-03-13 DIAGNOSIS — R262 Difficulty in walking, not elsewhere classified: Secondary | ICD-10-CM | POA: Diagnosis not present

## 2018-03-13 DIAGNOSIS — M6281 Muscle weakness (generalized): Secondary | ICD-10-CM | POA: Diagnosis not present

## 2018-03-13 DIAGNOSIS — R278 Other lack of coordination: Secondary | ICD-10-CM | POA: Diagnosis not present

## 2018-03-13 NOTE — Therapy (Signed)
[facility] [address] [place], [place], [address] Phone: [phone]   Fax:  [phone]  Occupational Therapy Treatment  Patient Details  Name: [name] MRN: [id number] Date of Birth: [birth year] Referring Provider (OT): Nita Sells, MD   Encounter Date: 03/13/2018  OT End of Session - 03/13/18 1146    Visit Number  2    Number of Visits  8    Date for OT Re-Evaluation  04/02/18    Authorization Type  1) [employer] Medicare HMO; $40 copay    OT Start Time  0945    OT Stop Time  1026    OT Time Calculation (min)  41 min    Activity Tolerance  Patient tolerated treatment well    Behavior During Therapy  [facility] for tasks assessed/performed       Past Medical History:  Diagnosis Date  . Diabetes mellitus without complication (HCC)   . Diverticulitis   . Fibromyalgia   . Hyperlipidemia   . Hypertension   . Papule 03/22/2015  . Stroke [facility])     Past Surgical History:  Procedure Laterality Date  . CHOLECYSTECTOMY    . KNEE SURGERY Left     There were no vitals filed for this visit.  Subjective Assessment - 03/13/18 0942    Subjective   S: My shoulder and arm used to hurt all the time but now it's just random.     Currently in Pain?  No/denies         [facility] OT Assessment - 03/13/18 0942      Assessment   Medical Diagnosis  Right hemiparesis s/p CVA      Precautions   Precautions  Fall               OT Treatments/Exercises (OP) - 03/13/18 0947      Exercises   Exercises  Shoulder      Neurological Re-education Exercises   Scapular Stabilization  Right;10 reps;Seated    Other Information  Min facilitation for shoulder elevation and protraction/retraction to complete full motion    Shoulder Flexion  PROM;5 reps    Shoulder ABduction  PROM;5 reps    Shoulder Protraction  PROM;AAROM;5 reps    Shoulder External Rotation  PROM;5 reps    Shoulder Internal Rotation  PROM;5 reps    Elbow Extension   PROM;AAROM;5 reps   elbow flexion, P/ROM and AA/ROM 5 reps   Wrist Flexion  PROM;5 reps    Wrist Extension  PROM;5 reps    Finger Extension  P/ROM full digit extension 5 reps    Weight Bearing Position  Seated    Seated with weight on forearm  Pt leaning over onto forearm for weightbearing, good form, 3x. 30" hold, two 1' holds.     Seated with weight on hand  Pt sat with RUE abducted slightly and hand placed at edge of mat table for weightbearing. OT providing stabilization at hand and elbow to prevent buckling. 1' hold, 1x. Pt unable to weightbear with hand open on flat surface due to increased pain in palm.       Manual Therapy   Manual Therapy  Myofascial release    Manual therapy comments  completed separately from therapeutic exercises     Myofascial Release  myofascial release completed to right upper arm, deltoid, and trapezius regions to decrease pain and fascial restrictions and increase ROM              OT  Education - 03/13/18 1030    Education Details  weightbearing, scapular A/ROM    Person(s) Educated  Patient    Methods  Explanation;Demonstration;Verbal cues;Handout    Comprehension  Verbalized understanding;Returned demonstration       OT Short Term Goals - 03/13/18 1146      OT SHORT TERM GOAL #1   Title  Patient will be educated and independent with HEP to faciliate progress in therapy and allow her to increase functional independence with ADLs.     Time  4    Period  Weeks    Status  On-going      OT SHORT TERM GOAL #2   Title  Patient will become educated and independent with compensatory techniques and assistive devices that will enable her to improve independence and safety during daily tasks.     Time  4    Period  Weeks    Status  On-going      OT SHORT TERM GOAL #3   Title  Patient will be educated on energy conservation techniques in order to be able to complete daily tasks with improved activity tolerance.     Time  4    Period  Weeks     Status  On-going      OT SHORT TERM GOAL #4   Title  Patient will decrease right shoulder pain to 6/10 or lower for improved quality of life.    Time  4    Period  Weeks    Status  On-going      OT SHORT TERM GOAL #5   Title  Patient will become educated on adaptive strategies to incorportate RUE to assist with bilateral functional tasks.     Time  4    Period  Weeks    Status  On-going               Plan - 03/13/18 1147    Clinical Impression Statement  A: Initiated weightbearing exercises and scapular mobilization today. Palpated mod fascial restrictions along upper arm/bicep region as well as deltoid and trapezius regions causing increased pain, therefore initiated manual therapy to address fascial restrictions. Also initiated passive stretching and AA/ROM for RUE. Verbal cuing for form and technique.     Plan  P: Follow up on HEP, continue with weightbearing working in standing while reaching for objects in various directions       Patient will benefit from skilled therapeutic intervention in order to improve the following deficits and impairments:  Decreased activity tolerance, Decreased knowledge of use of DME, Decreased strength, Impaired flexibility, Decreased mobility, Decreased range of motion, Pain, Decreased coordination, Increased fascial restrictions, Impaired UE functional use, Impaired tone  Visit Diagnosis: Acute pain of right shoulder  Other symptoms and signs involving the musculoskeletal system  Other lack of coordination    Problem List Patient Active Problem List   Diagnosis Date Noted  . Hypothyroidism 11/29/2017  . Cerebrovascular accident (CVA) (HCC)   . Ischemic stroke (HCC)   . Type 2 diabetes mellitus with hyperglycemia (HCC) 11/25/2017  . HLD (hyperlipidemia) 11/25/2017  . Fibromyalgia 11/25/2017  . Depression 11/25/2017  . Tobacco abuse 11/25/2017  . Papule 03/22/2015  . Hypertension   . Acute diverticulitis 11/08/2014  . Diabetes  (HCC) 11/08/2014  . Renal insufficiency 11/08/2014  . Abdominal pain 11/08/2014  . Left leg weakness 04/10/2011  . Abnormal gait 04/10/2011  . S/P total knee replacement 12/28/2010  . Knee pain 12/28/2010   Ezra Sites, OTR/L  [phone] 03/13/2018, 12:05 PM  [facility] [address] [place], [place], [address] Phone: [phone]   Fax:  [phone]  Name: [name] MRN: [id number] Date of Birth: 07/31/1955

## 2018-03-13 NOTE — Patient Instructions (Signed)
Weight-bearing for Shoulder Subluxation   1) Weight-bearing lean stretch: From a seated position on your bed or bench, prop yourself up on your affected arm by placing your affected arm about a foot away from your body. Then lean into it.  Hold for 10 seconds.    OR   2) Rocking Side-to-Side: place both arms out beside you (one on either side). Then slowly rock from side to side, shifting your weight from one arm to the next. You can place a rolled up towel underneath your hand to increase comfort.    3) Seated Row   Sit up straight with elbows by your sides. Pull back with shoulders/elbows, keeping forearms straight, as if pulling back on the reins of a horse. Squeeze shoulder blades together. Repeat _15__times, __3__sets/day    4) Shoulder Elevation    Sit up straight with arms by your sides. Slowly bring your shoulders up towards your ears. Repeat_15__times, __3__ sets/day

## 2018-03-14 ENCOUNTER — Ambulatory Visit (HOSPITAL_COMMUNITY): Payer: Medicare HMO | Admitting: Occupational Therapy

## 2018-03-14 ENCOUNTER — Ambulatory Visit (HOSPITAL_COMMUNITY): Payer: Medicare HMO

## 2018-03-14 ENCOUNTER — Encounter (HOSPITAL_COMMUNITY): Payer: Self-pay

## 2018-03-14 ENCOUNTER — Encounter (HOSPITAL_COMMUNITY): Payer: Self-pay | Admitting: Occupational Therapy

## 2018-03-14 DIAGNOSIS — R262 Difficulty in walking, not elsewhere classified: Secondary | ICD-10-CM

## 2018-03-14 DIAGNOSIS — M6281 Muscle weakness (generalized): Secondary | ICD-10-CM

## 2018-03-14 DIAGNOSIS — R2681 Unsteadiness on feet: Secondary | ICD-10-CM

## 2018-03-14 DIAGNOSIS — M25511 Pain in right shoulder: Secondary | ICD-10-CM | POA: Diagnosis not present

## 2018-03-14 DIAGNOSIS — R29898 Other symptoms and signs involving the musculoskeletal system: Secondary | ICD-10-CM

## 2018-03-14 DIAGNOSIS — R278 Other lack of coordination: Secondary | ICD-10-CM

## 2018-03-14 DIAGNOSIS — I69351 Hemiplegia and hemiparesis following cerebral infarction affecting right dominant side: Secondary | ICD-10-CM | POA: Diagnosis not present

## 2018-03-14 NOTE — Therapy (Signed)
[facility] [address] [place], [place], [address] Phone: [phone]   Fax:  [phone]  Physical Therapy Treatment  Patient Details  Name: [name] MRN: [id number] Date of Birth: 01/07/1956 Referring Provider (PT): Benita StabileJohn Z Hall, MD   Encounter Date: 03/14/2018  PT End of Session - 03/14/18 1116    Visit Number  9    Number of Visits  17    Date for PT Re-Evaluation  04/09/18   Minireassess complete 03/12/18   Authorization Type  [employer] Medicare HMO    Authorization Time Period  02/12/18 to 04/09/18    Authorization - Visit Number  9    Authorization - Number of Visits  17    PT Start Time  1035    PT Stop Time  1119   EOS on Nustep x 5', not included with charges   PT Time Calculation (min)  44 min    Equipment Utilized During Treatment  Gait belt    Activity Tolerance  Patient tolerated treatment well;Patient limited by fatigue;Patient limited by pain   Rt UE pain   Behavior During Therapy  [facility] for tasks assessed/performed       Past Medical History:  Diagnosis Date  . Diabetes mellitus without complication (HCC)   . Diverticulitis   . Fibromyalgia   . Hyperlipidemia   . Hypertension   . Papule 03/22/2015  . Stroke [facility](HCC)     Past Surgical History:  Procedure Laterality Date  . CHOLECYSTECTOMY    . KNEE SURGERY Left     There were no vitals filed for this visit.  Subjective Assessment - 03/14/18 1211    Subjective  Pt stated she is feeling good today, no reports of pain.  Stated her shoulder can be painful wiht certain movements    Patient Stated Goals  be able to walk better    Currently in Pain?  No/denies                       [facility] Adult PT Treatment/Exercise - 03/14/18 1050      Exercises   Exercises  Knee/Hip                       Knee/Hip Exercises: Aerobic   Nustep  x5 mins, L1, seat 7, BLE and LUE for reciprocal gait pattern      Knee/Hip Exercises: Machines for  Strengthening   Total Gym Leg Press  bodycraft leg press 2x10 weight; manual assistance to reduce hyperextension      Knee/Hip Exercises: Standing   Heel Raises  20 reps;Both    Hip Abduction  Both;10 reps    Abduction Limitations  LUE support    Forward Step Up  Right;10 reps;Hand Hold: 2;Step Height: 4"    Forward Step Up Limitations  manual assistance to reduce hyperextension    Functional Squat  10 reps    Functional Squat Limitations  1-UE support    Rebounder  toe tapping 4" step x10 each, LUE support      Knee/Hip Exercises: Seated   Sit to Sand  10 reps;with UE support   Lt UE assistance                                       Balance Exercises - 03/14/18 1100      Balance Exercises:  Standing   Standing Eyes Closed  Narrow base of support (BOS);Foam/compliant surface;3 reps;10 secs    Partial Tandem Stance  Eyes open;Intermittent upper extremity support;2 reps   statis 30" holds then head rotations both stance   Marching Limitations  x10 firm, LUE support        PT Education - 03/14/18 1217    Education Details  Educated on purpose of AFO, visual image and given referal as well as print out with places to purchase.      Person(s) Educated  Patient    Methods  Explanation;Demonstration;Handout    Comprehension  Verbalized understanding;Returned demonstration       PT Short Term Goals - 03/12/18 1035      PT SHORT TERM GOAL #1   Title  Pt will be independent with HEP and perform consistently in order to maximize strength, balance, and independence at home.    Time  4    Period  Weeks    Status  Achieved      PT SHORT TERM GOAL #2   Title  Pt will have improved MMT by 1/2 grade in order to maximize transfers, gait, and balance.    Time  4    Period  Weeks    Status  On-going      PT SHORT TERM GOAL #3   Title  Pt will have improved BERG balance score to 40/56 to demo improved balance and decreased risk for falls.    Time  4    Period  Weeks     Status  On-going        PT Long Term Goals - 03/12/18 1036      PT LONG TERM GOAL #1   Title  Pt will have improved MMT by 1 grade throughout in order to further maximize functional mobility, balance, and decrease her risk for falls.    Time  8    Period  Weeks    Status  On-going      PT LONG TERM GOAL #2   Title  Pt will have improved BERG balance score to 46/56 in order to further demo improved balance and decreased risk for falls.    Time  8    Period  Weeks    Status  On-going      PT LONG TERM GOAL #3   Title  Pt will be able to perform the TUG in 45sec or better with LRAD to demo improved functional mobility and balance in order to maximize [facility] ambulation.     Time  8    Period  Weeks    Status  On-going      PT LONG TERM GOAL #4   Title  Pt will have improved 5xSTS to 15sec or < without UE to demo improved functional strength and mobility.    Time  8    Period  Weeks    Status  On-going      PT LONG TERM GOAL #5   Title  Pt will have improved by 122ft or > with LRAD, mod I, and without evidence of R toe drag in order to demo improved functional mobility and maximize her community ambulation.     Time  8    Period  Weeks    Status  On-going            Plan - 03/14/18 1212    Clinical Impression Statement  Received signed referral for AFO, pt given order copy, printout of  places to order from and educated on purpose on AFO to assist with gait training wiht verbalized understanding.  Continued session focus with gait training, functional strengthening and balance training.  Min A for all balance activities for safety with LOB.  Added step up and leg press for LE strengthening wiht manual assistance to reduce Rt knee hyperextension wiht task.  No reports of pain through session, was limited by fatigue wiht visible muscle fatigue noted Rt LE at EOS.      Rehab Potential  Good    PT Frequency  2x / week    PT Duration  8 weeks    PT Treatment/Interventions   ADLs/Self Care Home Management;Aquatic Therapy;Cryotherapy;Electrical Stimulation;Moist Heat;Ultrasound;DME Instruction;Gait training;Functional mobility Network engineer;Therapeutic activities;Therapeutic exercise;Balance training;Neuromuscular re-education;Patient/family education;Orthotic Fit/Training;Manual techniques;Wheelchair mobility training;Scar mobilization;Passive range of motion;Dry needling;Energy conservation;Taping    PT Next Visit Plan  answer any questions pertaining to AFO purchase.  Continue BLE and functional strengthening, balance work, R HS and gastroc strengthening, and coordination of RLE    PT Home Exercise Plan  eval: bridging, supine march, supine clams RTB 10/22: adduction, LAQ, heel/toe raises; 11/5: seated heel slides, supine heel slides, standing knee flexion       Patient will benefit from skilled therapeutic intervention in order to improve the following deficits and impairments:  Abnormal gait, Decreased activity tolerance, Decreased balance, Decreased coordination, Decreased endurance, Decreased mobility, Decreased range of motion, Decreased strength, Difficulty walking, Increased muscle spasms, Impaired flexibility, Impaired UE functional use  Visit Diagnosis: Muscle weakness (generalized)  Difficulty in walking, not elsewhere classified  Unsteadiness on feet     Problem List Patient Active Problem List   Diagnosis Date Noted  . Hypothyroidism 11/29/2017  . Cerebrovascular accident (CVA) (HCC)   . Ischemic stroke (HCC)   . Type 2 diabetes mellitus with hyperglycemia (HCC) 11/25/2017  . HLD (hyperlipidemia) 11/25/2017  . Fibromyalgia 11/25/2017  . Depression 11/25/2017  . Tobacco abuse 11/25/2017  . Papule 03/22/2015  . Hypertension   . Acute diverticulitis 11/08/2014  . Diabetes (HCC) 11/08/2014  . Renal insufficiency 11/08/2014  . Abdominal pain 11/08/2014  . Left leg weakness 04/10/2011  . Abnormal gait 04/10/2011  . S/P total knee  replacement 12/28/2010  . Knee pain 12/28/2010   Becky Sax, LPTA; CBIS [phone]  Juel Burrow 03/14/2018, 12:20 PM  [place] [facility] [address] [place], [place], [address] Phone: [phone]   Fax:  [phone]  Name: [name] MRN: [id number] Date of Birth: [birth year]

## 2018-03-14 NOTE — Therapy (Signed)
[facility] [address] [place], [place], [address] Phone: [phone]   Fax:  [phone]  Occupational Therapy Treatment  Patient Details  Name: [name] MRN: [id number] Date of Birth: 10/14/1955 Referring Provider (OT): Nita SellsJohn Hall, MD   Encounter Date: 03/14/2018  OT End of Session - 03/14/18 1040    Visit Number  3    Number of Visits  8    Date for OT Re-Evaluation  04/02/18    Authorization Type  1) [employer] Medicare HMO; $40 copay    OT Start Time  [phone]    OT Stop Time  1030    OT Time Calculation (min)  43 min    Activity Tolerance  Patient tolerated treatment well    Behavior During Therapy  [facility] for tasks assessed/performed       Past Medical History:  Diagnosis Date  . Diabetes mellitus without complication (HCC)   . Diverticulitis   . Fibromyalgia   . Hyperlipidemia   . Hypertension   . Papule 03/22/2015  . Stroke [facility](HCC)     Past Surgical History:  Procedure Laterality Date  . CHOLECYSTECTOMY    . KNEE SURGERY Left     There were no vitals filed for this visit.  Subjective Assessment - 03/14/18 0947    Subjective   S: My hand was open this morning.     Currently in Pain?  No/denies         [facility] - 03/14/18 0947      Assessment   Medical Diagnosis  Right hemiparesis s/p CVA      Precautions   Precautions  Fall               OT Treatments/Exercises (OP) - 03/14/18 1032      Exercises   Exercises  Shoulder      Neurological Re-education Exercises   Scapular Stabilization  Right;10 reps;Seated    Other Information  Min facilitation for shoulder elevation and protraction/retraction to complete full motion    Shoulder Flexion  PROM;5 reps    Shoulder ABduction  PROM;5 reps    Shoulder Protraction  PROM;5 reps;Supine;AAROM;10 reps;Seated    Shoulder Horizontal ABduction  PROM;5 reps    Shoulder External Rotation  PROM;5 reps    Shoulder Internal Rotation  PROM;5 reps     Elbow Extension  PROM;5 reps;Supine;AAROM;10 reps;Seated   elbow flexion, P/ROM 5 reps, AA/ROM 10 reps   Forearm Supination  PROM;5 reps    Forearm Pronation  PROM;5 reps    Wrist Flexion  PROM;5 reps    Wrist Extension  PROM;5 reps    Finger Extension  P/ROM full digit extension 5 reps    Weight Bearing Position  Seated;Standing    Seated with weight on forearm  Pt leaning over onto forearm for weightbearing, good form, 1x 1' hold.     Seated with weight on hand  Pt sat with RUE abducted slightly and hand placed at edge of mat table for weightbearing. OT providing stabilization at hand and elbow to prevent buckling. 1' hold, 2x.    Standing with weight shifting on and off  Pt stood at mat table working to use left arm to move cones from directly in front of her to the right side, weight shifting on and off of RUE with OT assist to prevent elbow buckling. Weightbearing on closed fist due to increased pain in palm when flat      Manual Therapy  Manual Therapy  Myofascial release    Manual therapy comments  completed separately from therapeutic exercises     Myofascial Release  myofascial release completed to right upper arm, deltoid, and trapezius regions to decrease pain and fascial restrictions and increase ROM                OT Short Term Goals - 03/13/18 1146      OT SHORT TERM GOAL #1   Title  Patient will be educated and independent with HEP to faciliate progress in therapy and allow her to increase functional independence with ADLs.     Time  4    Period  Weeks    Status  On-going      OT SHORT TERM GOAL #2   Title  Patient will become educated and independent with compensatory techniques and assistive devices that will enable her to improve independence and safety during daily tasks.     Time  4    Period  Weeks    Status  On-going      OT SHORT TERM GOAL #3   Title  Patient will be educated on energy conservation techniques in order to be able to complete daily  tasks with improved activity tolerance.     Time  4    Period  Weeks    Status  On-going      OT SHORT TERM GOAL #4   Title  Patient will decrease right shoulder pain to 6/10 or lower for improved quality of life.    Time  4    Period  Weeks    Status  On-going      OT SHORT TERM GOAL #5   Title  Patient will become educated on adaptive strategies to incorportate RUE to assist with bilateral functional tasks.     Time  4    Period  Weeks    Status  On-going               Plan - 03/14/18 1040    Clinical Impression Statement  A: Continued with manual therapy today, pt reporting decreased tightness in RUE and OT notes improvement in fascial restrictions since yesterday. Pt also notes her hand was open instead of fisted when she woke up this morning. Continued with weightbearing and initiated weight-shifting activities to work on breaking up tone and improving function of RUE. Verbal cuing and occasional tactile assist for form and technique. Asked pt to bring in splint that she has for nighttime use (does not wear).     Plan  P: Continue with weightbearing/weight-shifting, attempt ES to facilitate extension of digits for improved grasp/release       Patient will benefit from skilled therapeutic intervention in order to improve the following deficits and impairments:  Decreased activity tolerance, Decreased knowledge of use of DME, Decreased strength, Impaired flexibility, Decreased mobility, Decreased range of motion, Pain, Decreased coordination, Increased fascial restrictions, Impaired UE functional use, Impaired tone  Visit Diagnosis: Acute pain of right shoulder  Other symptoms and signs involving the musculoskeletal system  Other lack of coordination    Problem List Patient Active Problem List   Diagnosis Date Noted  . Hypothyroidism 11/29/2017  . Cerebrovascular accident (CVA) (HCC)   . Ischemic stroke (HCC)   . Type 2 diabetes mellitus with hyperglycemia (HCC)  11/25/2017  . HLD (hyperlipidemia) 11/25/2017  . Fibromyalgia 11/25/2017  . Depression 11/25/2017  . Tobacco abuse 11/25/2017  . Papule 03/22/2015  . Hypertension   .  Acute diverticulitis 11/08/2014  . Diabetes (HCC) 11/08/2014  . Renal insufficiency 11/08/2014  . Abdominal pain 11/08/2014  . Left leg weakness 04/10/2011  . Abnormal gait 04/10/2011  . S/P total knee replacement 12/28/2010  . Knee pain 12/28/2010   Ezra Sites, OTR/L  [phone] 03/14/2018, 10:45 AM  [facility] [address] [place], [place], [address] Phone: [phone]   Fax:  [phone]  Name: [name] MRN: [id number] Date of Birth: 02/01/1956

## 2018-03-15 DIAGNOSIS — R69 Illness, unspecified: Secondary | ICD-10-CM | POA: Diagnosis not present

## 2018-03-15 DIAGNOSIS — Z8673 Personal history of transient ischemic attack (TIA), and cerebral infarction without residual deficits: Secondary | ICD-10-CM | POA: Diagnosis not present

## 2018-03-15 DIAGNOSIS — E1169 Type 2 diabetes mellitus with other specified complication: Secondary | ICD-10-CM | POA: Diagnosis not present

## 2018-03-15 DIAGNOSIS — I10 Essential (primary) hypertension: Secondary | ICD-10-CM | POA: Diagnosis not present

## 2018-03-15 DIAGNOSIS — E782 Mixed hyperlipidemia: Secondary | ICD-10-CM | POA: Diagnosis not present

## 2018-03-15 DIAGNOSIS — E039 Hypothyroidism, unspecified: Secondary | ICD-10-CM | POA: Diagnosis not present

## 2018-03-19 ENCOUNTER — Encounter (HOSPITAL_COMMUNITY): Payer: Self-pay

## 2018-03-19 ENCOUNTER — Ambulatory Visit (HOSPITAL_COMMUNITY): Payer: Medicare HMO | Admitting: Occupational Therapy

## 2018-03-19 ENCOUNTER — Ambulatory Visit (HOSPITAL_COMMUNITY): Payer: Medicare HMO

## 2018-03-19 ENCOUNTER — Encounter (HOSPITAL_COMMUNITY): Payer: Self-pay | Admitting: Occupational Therapy

## 2018-03-19 DIAGNOSIS — M6281 Muscle weakness (generalized): Secondary | ICD-10-CM

## 2018-03-19 DIAGNOSIS — R278 Other lack of coordination: Secondary | ICD-10-CM

## 2018-03-19 DIAGNOSIS — R29898 Other symptoms and signs involving the musculoskeletal system: Secondary | ICD-10-CM

## 2018-03-19 DIAGNOSIS — R2681 Unsteadiness on feet: Secondary | ICD-10-CM

## 2018-03-19 DIAGNOSIS — R262 Difficulty in walking, not elsewhere classified: Secondary | ICD-10-CM

## 2018-03-19 DIAGNOSIS — M25511 Pain in right shoulder: Secondary | ICD-10-CM | POA: Diagnosis not present

## 2018-03-19 DIAGNOSIS — I69351 Hemiplegia and hemiparesis following cerebral infarction affecting right dominant side: Secondary | ICD-10-CM | POA: Diagnosis not present

## 2018-03-19 NOTE — Therapy (Signed)
[facility] [address] [place], [place], [address] Phone: [phone]   Fax:  [phone]  Occupational Therapy Treatment  Patient Details  Name: [name] MRN: [id number] Date of Birth: [birth year] Referring Provider (OT): Nita Sells, MD   Encounter Date: 03/19/2018  OT End of Session - 03/19/18 1542    Visit Number  4    Number of Visits  8    Date for OT Re-Evaluation  04/02/18    Authorization Type  1) [employer] Medicare HMO; $40 copay    OT Start Time  1430    OT Stop Time  1515    OT Time Calculation (min)  45 min    Activity Tolerance  Patient tolerated treatment well    Behavior During Therapy  [facility] for tasks assessed/performed       Past Medical History:  Diagnosis Date  . Diabetes mellitus without complication (HCC)   . Diverticulitis   . Fibromyalgia   . Hyperlipidemia   . Hypertension   . Papule 03/22/2015  . Stroke [facility])     Past Surgical History:  Procedure Laterality Date  . CHOLECYSTECTOMY    . KNEE SURGERY Left     There were no vitals filed for this visit.  Subjective Assessment - 03/19/18 1431    Subjective   S: It's a whole lot easier to open up my fingers than it was.     Currently in Pain?  No/denies         [facility] OT Assessment - 03/19/18 1430      Assessment   Medical Diagnosis  Right hemiparesis s/p CVA      Precautions   Precautions  Fall               OT Treatments/Exercises (OP) - 03/19/18 1432      Exercises   Exercises  Shoulder      Neurological Re-education Exercises   Scapular Stabilization  Right;10 reps;Seated    Other Information  Min facilitation for shoulder elevation and protraction/retraction to complete full motion    Shoulder Flexion  AAROM;5 reps   OT providing min assist to secure grip   Shoulder Protraction  AAROM;10 reps;Seated   OT providing min assist to secure grip   Elbow Extension  PROM;5 reps;AAROM;10 reps   elbow flexion P/ROM 5x,  AA/ROM 10X   Finger Flexion  A/ROM 5 reps    Finger Extension  P/ROM full digit extension 5 reps. Using ES machine pt working to actively extend digits during on ramp    Weight Bearing Position  Seated    Seated with weight on forearm  Pt leaning over onto forearm for weightbearing, good form, 1x 1' hold.     Seated with weight on hand  Pt sat with RUE abducted slightly and hand placed at edge of mat table for weightbearing. OT providing stabilization at hand and elbow to prevent buckling. 1' hold, 1x      Modalities   Modalities  Geologist, engineering Location  rigth dorsal forearm targeting extensors    Engineer, manufacturing  Research officer, trade union Parameters  34 ma CC    Electrical Stimulation Goals  Neuromuscular facilitation      Manual Therapy   Manual Therapy  Myofascial release    Manual therapy comments  completed separately from therapeutic exercises     Myofascial Release  myofascial release completed to  right upper arm, deltoid, and trapezius regions to decrease pain and fascial restrictions and increase ROM                OT Short Term Goals - 03/13/18 1146      OT SHORT TERM GOAL #1   Title  Patient will be educated and independent with HEP to faciliate progress in therapy and allow her to increase functional independence with ADLs.     Time  4    Period  Weeks    Status  On-going      OT SHORT TERM GOAL #2   Title  Patient will become educated and independent with compensatory techniques and assistive devices that will enable her to improve independence and safety during daily tasks.     Time  4    Period  Weeks    Status  On-going      OT SHORT TERM GOAL #3   Title  Patient will be educated on energy conservation techniques in order to be able to complete daily tasks with improved activity tolerance.     Time  4    Period  Weeks    Status  On-going      OT SHORT TERM GOAL #4    Title  Patient will decrease right shoulder pain to 6/10 or lower for improved quality of life.    Time  4    Period  Weeks    Status  On-going      OT SHORT TERM GOAL #5   Title  Patient will become educated on adaptive strategies to incorportate RUE to assist with bilateral functional tasks.     Time  4    Period  Weeks    Status  On-going               Plan - 03/19/18 1542    Clinical Impression Statement  A: Pt reporting increased soreness since manual therapy, only sore when moving arm or at night. Initiated ES use for facilitating extensors today, pt able to extend digits during activity. Pt with good grip today during AA/ROM, OT providing min assist for securing grip on dowel rod. Continued with weight-bearing today, pt with improved form. Verbal cuing for form and technique throughout session. Pt brought in Dynapro splint, OT assessed and when in splint pt's wrist is extended causing fingers to flex, splint not fabricated for adjustment. OT will research alternative splints for nighttime use and provide pt with information.     Plan  P: Continue working to facilitate wrist/digit extension with ES, continue with AA/ROM in supine working to increasing pt independence       Patient will benefit from skilled therapeutic intervention in order to improve the following deficits and impairments:  Decreased activity tolerance, Decreased knowledge of use of DME, Decreased strength, Impaired flexibility, Decreased mobility, Decreased range of motion, Pain, Decreased coordination, Increased fascial restrictions, Impaired UE functional use, Impaired tone  Visit Diagnosis: Acute pain of right shoulder  Other symptoms and signs involving the musculoskeletal system  Other lack of coordination  Hemiplegia and hemiparesis following cerebral infarction affecting right dominant side [facility](HCC)    Problem List Patient Active Problem List   Diagnosis Date Noted  . Hypothyroidism 11/29/2017   . Cerebrovascular accident (CVA) (HCC)   . Ischemic stroke (HCC)   . Type 2 diabetes mellitus with hyperglycemia (HCC) 11/25/2017  . HLD (hyperlipidemia) 11/25/2017  . Fibromyalgia 11/25/2017  . Depression 11/25/2017  . Tobacco abuse 11/25/2017  .  Papule 03/22/2015  . Hypertension   . Acute diverticulitis 11/08/2014  . Diabetes (HCC) 11/08/2014  . Renal insufficiency 11/08/2014  . Abdominal pain 11/08/2014  . Left leg weakness 04/10/2011  . Abnormal gait 04/10/2011  . S/P total knee replacement 12/28/2010  . Knee pain 12/28/2010   Ezra Sites, OTR/L  [phone] 03/19/2018, 3:46 PM  [facility] [address] [place], [place], [address] Phone: [phone]   Fax:  [phone]  Name: KYNDAL HERINGER MRN: [id number] Date of Birth: 1955/06/30

## 2018-03-19 NOTE — Therapy (Signed)
[place] [facility] [address] [place], [place], [address] Phone: [phone]   Fax:  [phone]  Physical Therapy Treatment  Patient Details  Name: SPARKLES MCNEELY MRN: [id number] Date of Birth: Mar 31, 1956 Referring Provider (PT): Benita Stabile, MD   Encounter Date: 03/19/2018  PT End of Session - 03/19/18 1346    Visit Number  10    Number of Visits  17    Date for PT Re-Evaluation  04/09/18   Minireassess complete 03/12/18   Authorization Type  [employer]na Medicare HMO    Authorization Time Period  02/12/18 to 04/09/18    Authorization - Visit Number  10    Authorization - Number of Visits  17    PT Start Time  1346    PT Stop Time  1429    PT Time Calculation (min)  43 min    Equipment Utilized During Treatment  Gait belt    Activity Tolerance  Patient tolerated treatment well;Patient limited by fatigue;Patient limited by pain   Rt UE pain   Behavior During Therapy  [facility] for tasks assessed/performed       Past Medical History:  Diagnosis Date  . Diabetes mellitus without complication (HCC)   . Diverticulitis   . Fibromyalgia   . Hyperlipidemia   . Hypertension   . Papule 03/22/2015  . Stroke [facility])     Past Surgical History:  Procedure Laterality Date  . CHOLECYSTECTOMY    . KNEE SURGERY Left     There were no vitals filed for this visit.  Subjective Assessment - 03/19/18 1347    Subjective  Pt reports that her should pain is intermittent. She states she had a close call this weekend but was able to catch herself and not fall. She is not sure what happened.     Patient Stated Goals  be able to walk better    Currently in Pain?  No/denies           [facility] Adult PT Treatment/Exercise - 03/19/18 0001      Ambulation/Gait   Ambulation Distance (Feet)  226 Feet    Assistive device  Hemi-walker;Other (Comment)   w/c follow for safety   Gait Comments  3 major toe drags which cuased pt to stumble/trip slightly, min  guard-min A for assistance      Exercises   Exercises  Knee/Hip      Knee/Hip Exercises: Aerobic   Nustep  x5 mins, L2, seat 7, BLE and LUE for reciprocal gait pattern      Knee/Hip Exercises: Seated   Other Seated Knee/Hip Exercises  R active ankle eversion x15 reps (cues to reduce hip ER)    Abd/Adduction Limitations  seated cone taps RLE x5RT (increased difficulty reaching across midline)      Knee/Hip Exercises: Supine   Other Supine Knee/Hip Exercises  RLE D1 and D2 flexion PNF x2-3 mins each (PROM and AAROM)      Knee/Hip Exercises: Sidelying   Clams  RLE only, 2x10 reps           PT Education - 03/19/18 1346    Education Details  continue HEP    Person(s) Educated  Patient    Methods  Explanation;Demonstration    Comprehension  Verbalized understanding;Returned demonstration       PT Short Term Goals - 03/12/18 1035      PT SHORT TERM GOAL #1   Title  Pt will be independent with HEP and perform consistently in  order to maximize strength, balance, and independence at home.    Time  4    Period  Weeks    Status  Achieved      PT SHORT TERM GOAL #2   Title  Pt will have improved MMT by 1/2 grade in order to maximize transfers, gait, and balance.    Time  4    Period  Weeks    Status  On-going      PT SHORT TERM GOAL #3   Title  Pt will have improved BERG balance score to 40/56 to demo improved balance and decreased risk for falls.    Time  4    Period  Weeks    Status  On-going        PT Long Term Goals - 03/12/18 1036      PT LONG TERM GOAL #1   Title  Pt will have improved MMT by 1 grade throughout in order to further maximize functional mobility, balance, and decrease her risk for falls.    Time  8    Period  Weeks    Status  On-going      PT LONG TERM GOAL #2   Title  Pt will have improved BERG balance score to 46/56 in order to further demo improved balance and decreased risk for falls.    Time  8    Period  Weeks    Status  On-going       PT LONG TERM GOAL #3   Title  Pt will be able to perform the TUG in 45sec or better with LRAD to demo improved functional mobility and balance in order to [facility].     Time  8    Period  Weeks    Status  On-going      PT LONG TERM GOAL #4   Title  Pt will have improved 5xSTS to 15sec or < without UE to demo improved functional strength and mobility.    Time  8    Period  Weeks    Status  On-going      PT LONG TERM GOAL #5   Title  Pt will have improved 3MWT by 12500ft or > with LRAD, mod I, and without evidence of R toe drag in order to demo improved functional mobility and maximize her community ambulation.     Time  8    Period  Weeks    Status  On-going            Plan - 03/19/18 1450    Clinical Impression Statement  Continued with established POC focusing on functional strength and gait. PT introduced pt to supine RLE PNF patterns (PROM and A/AROM) in order to facilitate improved coordination and reduced tone in RLE. Pt very challenged with sidelying hip abd but added to HEP in order for her to strengthen her glute med in order to improve gait and balance. Pt reporting that she has noticed her R ankle rolling out to the side some during gait; PT educated that this is just R ankle weakness and told her that the AFO would help with this as well. She stated that she has an appointment tomorrow with the orthotist for her AFO fitting. Added seated ankle eversion to HEP as well to address ankle weakness. Continue as planned, progressing as able.     Rehab Potential  Good    PT Frequency  2x / week    PT Duration  8 weeks  PT Treatment/Interventions  ADLs/Self Care Home Management;Aquatic Therapy;Cryotherapy;Electrical Stimulation;Moist Heat;Ultrasound;DME Instruction;Gait training;Functional mobility Network engineer;Therapeutic activities;Therapeutic exercise;Balance training;Neuromuscular re-education;Patient/family education;Orthotic Fit/Training;Manual  techniques;Wheelchair mobility training;Scar mobilization;Passive range of motion;Dry needling;Energy conservation;Taping    PT Next Visit Plan  f/u on AFO; Continue BLE and functional strengthening, balance work, R HS and gastroc strengthening, and coordination of RLE    PT Home Exercise Plan  eval: bridging, supine march, supine clams RTB 10/22: adduction, LAQ, heel/toe raises; 11/5: seated heel slides, supine heel slides, standing knee flexion; 11/19: sidelying clams, seated ankle eversion    Consulted and Agree with Plan of Care  Patient       Patient will benefit from skilled therapeutic intervention in order to improve the following deficits and impairments:  Abnormal gait, Decreased activity tolerance, Decreased balance, Decreased coordination, Decreased endurance, Decreased mobility, Decreased range of motion, Decreased strength, Difficulty walking, Increased muscle spasms, Impaired flexibility, Impaired UE functional use  Visit Diagnosis: Muscle weakness (generalized)  Difficulty in walking, not elsewhere classified  Unsteadiness on feet     Problem List Patient Active Problem List   Diagnosis Date Noted  . Hypothyroidism 11/29/2017  . Cerebrovascular accident (CVA) (HCC)   . Ischemic stroke (HCC)   . Type 2 diabetes mellitus with hyperglycemia (HCC) 11/25/2017  . HLD (hyperlipidemia) 11/25/2017  . Fibromyalgia 11/25/2017  . Depression 11/25/2017  . Tobacco abuse 11/25/2017  . Papule 03/22/2015  . Hypertension   . Acute diverticulitis 11/08/2014  . Diabetes (HCC) 11/08/2014  . Renal insufficiency 11/08/2014  . Abdominal pain 11/08/2014  . Left leg weakness 04/10/2011  . Abnormal gait 04/10/2011  . S/P total knee replacement 12/28/2010  . Knee pain 12/28/2010       Jac Canavan PT, DPT    [facility] [address] [place], [place], [address] Phone: [phone]   Fax:  [phone]  Name: QUANTINA DERSHEM MRN:  [id number] Date of Birth: November 11, 1955

## 2018-03-21 ENCOUNTER — Ambulatory Visit (HOSPITAL_COMMUNITY): Payer: Medicare HMO | Admitting: Occupational Therapy

## 2018-03-21 ENCOUNTER — Ambulatory Visit (HOSPITAL_COMMUNITY): Payer: Medicare HMO

## 2018-03-21 ENCOUNTER — Encounter (HOSPITAL_COMMUNITY): Payer: Self-pay

## 2018-03-21 ENCOUNTER — Encounter (HOSPITAL_COMMUNITY): Payer: Self-pay | Admitting: Occupational Therapy

## 2018-03-21 DIAGNOSIS — M25511 Pain in right shoulder: Secondary | ICD-10-CM | POA: Diagnosis not present

## 2018-03-21 DIAGNOSIS — I69351 Hemiplegia and hemiparesis following cerebral infarction affecting right dominant side: Secondary | ICD-10-CM

## 2018-03-21 DIAGNOSIS — M6281 Muscle weakness (generalized): Secondary | ICD-10-CM | POA: Diagnosis not present

## 2018-03-21 DIAGNOSIS — R278 Other lack of coordination: Secondary | ICD-10-CM | POA: Diagnosis not present

## 2018-03-21 DIAGNOSIS — R2681 Unsteadiness on feet: Secondary | ICD-10-CM

## 2018-03-21 DIAGNOSIS — R262 Difficulty in walking, not elsewhere classified: Secondary | ICD-10-CM

## 2018-03-21 DIAGNOSIS — R29898 Other symptoms and signs involving the musculoskeletal system: Secondary | ICD-10-CM | POA: Diagnosis not present

## 2018-03-21 NOTE — Therapy (Signed)
[place] [facility] [address] [place], [place], [address] Phone: [phone]   Fax:  [phone]  Physical Therapy Treatment  Patient Details  Name: [name] MRN: [id number] Date of Birth: 31-Aug-1955 Referring Provider (PT): Benita Stabile, MD   Encounter Date: 03/21/2018  PT End of Session - 03/21/18 1027    Visit Number  11    Number of Visits  17    Date for PT Re-Evaluation  04/09/18   Minireassess complete 03/12/18   Authorization Type  [employer] Medicare HMO    Authorization Time Period  02/12/18 to 04/09/18    Authorization - Visit Number  11    Authorization - Number of Visits  17    PT Start Time  1027    PT Stop Time  1113    PT Time Calculation (min)  46 min    Equipment Utilized During Treatment  Gait belt    Activity Tolerance  Patient tolerated treatment well;Patient limited by fatigue;Patient limited by pain   Rt UE pain   Behavior During Therapy  [facility] for tasks assessed/performed       Past Medical History:  Diagnosis Date  . Diabetes mellitus without complication (HCC)   . Diverticulitis   . Fibromyalgia   . Hyperlipidemia   . Hypertension   . Papule 03/22/2015  . Stroke [facility])     Past Surgical History:  Procedure Laterality Date  . CHOLECYSTECTOMY    . KNEE SURGERY Left     There were no vitals filed for this visit.  Subjective Assessment - 03/21/18 1028    Subjective  Pt states she had her AFO appointment yesterday. She reports that she is going to get a new shoe to go with it and it will be ready in about 1 week. The orthotist told her that he wants to see her walk in it before she can take it home.     Patient Stated Goals  be able to walk better    Currently in Pain?  Yes    Pain Score  3     Pain Location  Shoulder    Pain Orientation  Right    Pain Descriptors / Indicators  Constant    Pain Type  Acute pain    Pain Onset  More than a month ago    Pain Frequency  Constant    Aggravating  Factors   n/a    Pain Relieving Factors  pain releif gel    Effect of Pain on Daily Activities  max effect on daily activities              [facility] Adult PT Treatment/Exercise - 03/21/18 0001      Exercises   Exercises  Knee/Hip      Knee/Hip Exercises: Supine   Other Supine Knee/Hip Exercises  RLE D1 and D2 flexion PNF x2-3 mins each (PROM and AAROM)    Other Supine Knee/Hip Exercises  R ankle DF and eversion x20 reps each      Modalities   Modalities  Electrical Stimulation      Electrical Stimulation   Electrical Stimulation Location  R peroneal mm for R ankle eversion    Electrical Stimulation Action  Russian x 8 mins    Electrical Stimulation Parameters  60mA    Electrical Stimulation Goals  Strength;Neuromuscular facilitation       Balance Exercises - 03/21/18 1114      Balance Exercises: Standing   Tandem Stance  Eyes open;Intermittent upper extremity support   +R/L, up/down head turns x10 reps each, BLE   Lift / Chop Limitations  standing cone taps BLE -- x5RT RLE tapping wiht 1UE, x5RT LLE tapping no UE, x3RT RLE tapping no UE             PT Short Term Goals - 03/12/18 1035      PT SHORT TERM GOAL #1   Title  Pt will be independent with HEP and perform consistently in order to maximize strength, balance, and independence at home.    Time  4    Period  Weeks    Status  Achieved      PT SHORT TERM GOAL #2   Title  Pt will have improved MMT by 1/2 grade in order to maximize transfers, gait, and balance.    Time  4    Period  Weeks    Status  On-going      PT SHORT TERM GOAL #3   Title  Pt will have improved BERG balance score to 40/56 to demo improved balance and decreased risk for falls.    Time  4    Period  Weeks    Status  On-going        PT Long Term Goals - 03/12/18 1036      PT LONG TERM GOAL #1   Title  Pt will have improved MMT by 1 grade throughout in order to further maximize functional mobility, balance, and decrease her risk for  falls.    Time  8    Period  Weeks    Status  On-going      PT LONG TERM GOAL #2   Title  Pt will have improved BERG balance score to 46/56 in order to further demo improved balance and decreased risk for falls.    Time  8    Period  Weeks    Status  On-going      PT LONG TERM GOAL #3   Title  Pt will be able to perform the TUG in 45sec or better with LRAD to demo improved functional mobility and balance in order to maximize [facility] ambulation.     Time  8    Period  Weeks    Status  On-going      PT LONG TERM GOAL #4   Title  Pt will have improved 5xSTS to 15sec or < without UE to demo improved functional strength and mobility.    Time  8    Period  Weeks    Status  On-going      PT LONG TERM GOAL #5   Title  Pt will have improved by 168ft or > with LRAD, mod I, and without evidence of R toe drag in order to demo improved functional mobility and maximize her community ambulation.     Time  8    Period  Weeks    Status  On-going            Plan - 03/21/18 1118    Clinical Impression Statement  Pt was fitted for AFO yesterday and it will be back in 1 week, but she won't have it full-time for about another 3 weeks because the orthotist wants to see her ambulate in it. Began session with supine PNF of the RLE (PROM and A/AROM) and supine active DF and eversion. Pt noted to have little to no peronal mm contraction for eversion of the ankle. Initiated [place] e-stim  to pt's R peroneals in order to improve her eversion strength so as to reduce the amount of inversion noted during gait. Good contraction noted during estim and will continue in future sessions to improve peroneal strength. Ended with balance this date. Pt generally challenged throughout. Performed tandem stance no UE and she was able to perform cone taps in standing this date. Noted difficutly with coordinating RLE tapping but able to tap with RLE with no UE and tap with LLE while balancing on RLE with no UE and min  guard/A throughout all. Pt progressing very well and needs continued skilled PT intervention in order to further improve balance, coordination, gait, and strength so as to reduce risk for falls, QOL, and independence at home.     Rehab Potential  Good    PT Frequency  2x / week    PT Duration  8 weeks    PT Treatment/Interventions  ADLs/Self Care Home Management;Aquatic Therapy;Cryotherapy;Electrical Stimulation;Moist Heat;Ultrasound;DME Instruction;Gait training;Functional mobility Network engineertraining;Stair training;Therapeutic activities;Therapeutic exercise;Balance training;Neuromuscular re-education;Patient/family education;Orthotic Fit/Training;Manual techniques;Wheelchair mobility training;Scar mobilization;Passive range of motion;Dry needling;Energy conservation;Taping    PT Next Visit Plan  contniue [place]ian to R peroneals; Continue BLE and functional strengthening, balance work, R HS and gastroc strengthening, and coordination of RLE; f/u on AFO    PT Home Exercise Plan  eval: bridging, supine march, supine clams RTB 10/22: adduction, LAQ, heel/toe raises; 11/5: seated heel slides, supine heel slides, standing knee flexion; 11/19: sidelying clams, seated ankle eversion    Consulted and Agree with Plan of Care  Patient       Patient will benefit from skilled therapeutic intervention in order to improve the following deficits and impairments:  Abnormal gait, Decreased activity tolerance, Decreased balance, Decreased coordination, Decreased endurance, Decreased mobility, Decreased range of motion, Decreased strength, Difficulty walking, Increased muscle spasms, Impaired flexibility, Impaired UE functional use  Visit Diagnosis: Hemiplegia and hemiparesis following cerebral infarction affecting right dominant side (HCC)  Muscle weakness (generalized)  Difficulty in walking, not elsewhere classified  Unsteadiness on feet     Problem List Patient Active Problem List   Diagnosis Date Noted  .  Hypothyroidism 11/29/2017  . Cerebrovascular accident (CVA) (HCC)   . Ischemic stroke (HCC)   . Type 2 diabetes mellitus with hyperglycemia (HCC) 11/25/2017  . HLD (hyperlipidemia) 11/25/2017  . Fibromyalgia 11/25/2017  . Depression 11/25/2017  . Tobacco abuse 11/25/2017  . Papule 03/22/2015  . Hypertension   . Acute diverticulitis 11/08/2014  . Diabetes (HCC) 11/08/2014  . Renal insufficiency 11/08/2014  . Abdominal pain 11/08/2014  . Left leg weakness 04/10/2011  . Abnormal gait 04/10/2011  . S/P total knee replacement 12/28/2010  . Knee pain 12/28/2010       Jac CanavanBrooke  PT, DPT  [facility] [address] [place], [place], [address] Phone: [phone]   Fax:  [phone]  Name: [name] MRN: [id number] Date of Birth: 08/28/1955

## 2018-03-21 NOTE — Therapy (Signed)
[facility] [address] [place], [place], [address] Phone: [phone]   Fax:  [phone]  Occupational Therapy Treatment  Patient Details  Name: [name] MRN: [id number] Date of Birth: [birth year] Referring Provider (OT): Nita Sells, MD   Encounter Date: 03/21/2018  OT End of Session - 03/21/18 1209    Visit Number  5    Number of Visits  8    Date for OT Re-Evaluation  04/02/18    Authorization Type  1) [employer] Medicare HMO; $40 copay    OT Start Time  1117    OT Stop Time  1201    OT Time Calculation (min)  44 min    Activity Tolerance  Patient tolerated treatment well    Behavior During Therapy  [facility] for tasks assessed/performed       Past Medical History:  Diagnosis Date  . Diabetes mellitus without complication (HCC)   . Diverticulitis   . Fibromyalgia   . Hyperlipidemia   . Hypertension   . Papule 03/22/2015  . Stroke [facility])     Past Surgical History:  Procedure Laterality Date  . CHOLECYSTECTOMY    . KNEE SURGERY Left     There were no vitals filed for this visit.  Subjective Assessment - 03/21/18 1118    Subjective   S: My arm is not hurting as much as it was last time.     Currently in Pain?  Yes    Pain Score  3     Pain Location  Shoulder    Pain Orientation  Right    Pain Descriptors / Indicators  Sore;Constant    Pain Type  Acute pain    Pain Radiating Towards  N/A    Pain Onset  More than a month ago    Pain Frequency  Constant    Aggravating Factors   n/a    Pain Relieving Factors  pain relief gel    Effect of Pain on Daily Activities  max effect on daily tasks    Multiple Pain Sites  No         [facility] OT Assessment - 03/21/18 1118      Assessment   Medical Diagnosis  Right hemiparesis s/p CVA      Precautions   Precautions  Fall               OT Treatments/Exercises (OP) - 03/21/18 1119      Exercises   Exercises  Shoulder      Neurological Re-education Exercises   Other Information  Attempted shoulder AA/ROM supine today, pt unable to complete due to pain at proximal lateral forearm near elbow    Shoulder Flexion  PROM;5 reps    Shoulder ABduction  PROM;5 reps    Shoulder Protraction  AAROM;10 reps;Seated   OT providing min assist to secure grip   Elbow Extension  PROM;5 reps;AAROM;10 reps   elbow flexion, P/ROM 5x, AA/ROM 10x   Wrist Flexion  PROM;5 reps    Wrist Extension  PROM;5 reps    Finger Flexion  During off ramp of ES pt actively flexing digits    Finger Extension  P/ROM full digit extension 5 reps. Using ES machine pt working to actively extend digits during on ramp    Weight Bearing Position  Seated    Seated with weight on forearm  Pt leaning over onto forearm for weightbearing, good form, 1x 1' hold.     Seated with weight on  hand  Pt sat with RUE abducted slightly and hand placed at edge of mat table for weightbearing. OT providing stabilization at hand and elbow to prevent buckling. 1' hold, 2x      Modalities   Modalities  Geologist, engineering Location  rigth dorsal forearm targeting extensors    Electrical Stimulation Action  Russian, 10 minutes    Electrical Stimulation Parameters  34 mA CC    Electrical Stimulation Goals  Neuromuscular facilitation               OT Short Term Goals - 03/13/18 1146      OT SHORT TERM GOAL #1   Title  Patient will be educated and independent with HEP to faciliate progress in therapy and allow her to increase functional independence with ADLs.     Time  4    Period  Weeks    Status  On-going      OT SHORT TERM GOAL #2   Title  Patient will become educated and independent with compensatory techniques and assistive devices that will enable her to improve independence and safety during daily tasks.     Time  4    Period  Weeks    Status  On-going      OT SHORT TERM GOAL #3   Title  Patient will be educated on energy  conservation techniques in order to be able to complete daily tasks with improved activity tolerance.     Time  4    Period  Weeks    Status  On-going      OT SHORT TERM GOAL #4   Title  Patient will decrease right shoulder pain to 6/10 or lower for improved quality of life.    Time  4    Period  Weeks    Status  On-going      OT SHORT TERM GOAL #5   Title  Patient will become educated on adaptive strategies to incorportate RUE to assist with bilateral functional tasks.     Time  4    Period  Weeks    Status  On-going               Plan - 03/21/18 1209    Clinical Impression Statement  A: Pt reporting decreased soreness in RUE today, good reduction in tone with weightbearing at beginning of session. Attempted to have pt complete shoulder AA/ROM in supine however pt experiencing pain at distal elbow region, improved when transitioned to sitting. Continued with ES use today working to facilitate wrist and digit extension. Verbal cuing for form and technique during session, educated on purpose and goal of ES.     Plan  P: Continue using ES to facilitate wrist extension, possibly try with elbow flexion. Update HEP for AA/ROM for right shoulder       Patient will benefit from skilled therapeutic intervention in order to improve the following deficits and impairments:  Decreased activity tolerance, Decreased knowledge of use of DME, Decreased strength, Impaired flexibility, Decreased mobility, Decreased range of motion, Pain, Decreased coordination, Increased fascial restrictions, Impaired UE functional use, Impaired tone  Visit Diagnosis: Other symptoms and signs involving the musculoskeletal system  Other lack of coordination  Hemiplegia and hemiparesis following cerebral infarction affecting right dominant side [facility])    Problem List Patient Active Problem List   Diagnosis Date Noted  . Hypothyroidism 11/29/2017  . Cerebrovascular accident (CVA) (HCC)   .  Ischemic stroke  (HCC)   . Type 2 diabetes mellitus with hyperglycemia (HCC) 11/25/2017  . HLD (hyperlipidemia) 11/25/2017  . Fibromyalgia 11/25/2017  . Depression 11/25/2017  . Tobacco abuse 11/25/2017  . Papule 03/22/2015  . Hypertension   . Acute diverticulitis 11/08/2014  . Diabetes (HCC) 11/08/2014  . Renal insufficiency 11/08/2014  . Abdominal pain 11/08/2014  . Left leg weakness 04/10/2011  . Abnormal gait 04/10/2011  . S/P total knee replacement 12/28/2010  . Knee pain 12/28/2010   Ezra SitesLeslie Gaelyn Tukes, OTR/L  [phone] 03/21/2018, 12:12 PM  [facility] [address] [name], [place], [address] Phone: [phone]   Fax:  [phone]  Name: Forbes CellarVirginia T Caligiuri MRN: [id number] Date of Birth: 02/14/1956

## 2018-03-25 ENCOUNTER — Encounter (HOSPITAL_COMMUNITY): Payer: Self-pay

## 2018-03-25 ENCOUNTER — Ambulatory Visit (HOSPITAL_COMMUNITY): Payer: Medicare HMO

## 2018-03-25 ENCOUNTER — Encounter (HOSPITAL_COMMUNITY): Payer: Self-pay | Admitting: Specialist

## 2018-03-25 ENCOUNTER — Ambulatory Visit (HOSPITAL_COMMUNITY): Payer: Medicare HMO | Admitting: Specialist

## 2018-03-25 DIAGNOSIS — M25511 Pain in right shoulder: Secondary | ICD-10-CM

## 2018-03-25 DIAGNOSIS — R2681 Unsteadiness on feet: Secondary | ICD-10-CM | POA: Diagnosis not present

## 2018-03-25 DIAGNOSIS — I69351 Hemiplegia and hemiparesis following cerebral infarction affecting right dominant side: Secondary | ICD-10-CM

## 2018-03-25 DIAGNOSIS — R278 Other lack of coordination: Secondary | ICD-10-CM | POA: Diagnosis not present

## 2018-03-25 DIAGNOSIS — R29898 Other symptoms and signs involving the musculoskeletal system: Secondary | ICD-10-CM | POA: Diagnosis not present

## 2018-03-25 DIAGNOSIS — R262 Difficulty in walking, not elsewhere classified: Secondary | ICD-10-CM

## 2018-03-25 DIAGNOSIS — M6281 Muscle weakness (generalized): Secondary | ICD-10-CM

## 2018-03-25 NOTE — Therapy (Signed)
[facility] [address] [place], [place], [address] Phone: [phone]   Fax:  [phone]  Occupational Therapy Treatment  Patient Details  Name: [name] MRN: [id number] Date of Birth: 1955/11/24 Referring Provider (OT): Nita Sells, MD   Encounter Date: 03/25/2018  OT End of Session - 03/25/18 1151    Visit Number  6    Number of Visits  8    Date for OT Re-Evaluation  04/02/18    Authorization Type  1) [employer] Medicare HMO; $40 copay    OT Start Time  1115    OT Stop Time  1200    OT Time Calculation (min)  45 min    Activity Tolerance  Patient tolerated treatment well    Behavior During Therapy  [facility] for tasks assessed/performed       Past Medical History:  Diagnosis Date  . Diabetes mellitus without complication (HCC)   . Diverticulitis   . Fibromyalgia   . Hyperlipidemia   . Hypertension   . Papule 03/22/2015  . Stroke [facility])     Past Surgical History:  Procedure Laterality Date  . CHOLECYSTECTOMY    . KNEE SURGERY Left     There were no vitals filed for this visit.  Subjective Assessment - 03/25/18 1149    Subjective   S:  I fell yesterday, I landed on my bed face first, but my right arm was out to my side.  It hurt and my arm is very sore today (mid upper arm).     Currently in Pain?  Yes    Pain Score  10-Worst pain ever    Pain Orientation  Right    Pain Descriptors / Indicators  Sore    Pain Type  Acute pain    Pain Radiating Towards  upper arm    Aggravating Factors   fall from yesterday    Pain Relieving Factors  after heat and myofascial release and development of reach activities, pain decreased to a 7/10    Effect of Pain on Daily Activities  max         [facility] OT Assessment - 03/25/18 0001      Assessment   Medical Diagnosis  Right hemiparesis s/p CVA               OT Treatments/Exercises (OP) - 03/25/18 1155      Neurological Re-education Exercises   Shoulder Flexion   PROM;AAROM;5 reps    Shoulder ABduction  PROM;AAROM;5 reps    Shoulder Protraction  PROM;AAROM;5 reps    Shoulder External Rotation  PROM;AAROM;5 reps    Shoulder Internal Rotation  PROM;AAROM;5 reps    Elbow Flexion  PROM;AAROM;5 reps    Elbow Extension  PROM;AAROM;5 reps    Forearm Supination  PROM;AAROM;5 reps    Forearm Pronation  PROM;AAROM;5 reps    Wrist Flexion  PROM;AAROM;5 reps    Wrist Extension  PROM;AAROM;5 reps    Development of Reach  Closed chain    Closed Chain Exercises  in supine, attempted reaching into flexion and protraction with patient using therapist hand as guide.  required max facilitation to reach and experienced increased pain and tightness in shoulder and elbow during activity       Modalities   Modalities  Moist Heat      Moist Heat Therapy   Number Minutes Moist Heat  10 Minutes    Moist Heat Location  Shoulder      Manual Therapy  Manual Therapy  Myofascial release    Manual therapy comments  completed separately from therapeutic exercises     Myofascial Release  myofascial release completed to right upper arm, deltoid, and trapezius regions to decrease pain and fascial restrictions and increase ROM              OT Education - 03/25/18 1150    Education Details  recommended use of heating pad at home for minimizing pain before HEP.    Person(s) Educated  Patient    Methods  Explanation    Comprehension  Verbalized understanding       OT Short Term Goals - 03/13/18 1146      OT SHORT TERM GOAL #1   Title  Patient will be educated and independent with HEP to faciliate progress in therapy and allow her to increase functional independence with ADLs.     Time  4    Period  Weeks    Status  On-going      OT SHORT TERM GOAL #2   Title  Patient will become educated and independent with compensatory techniques and assistive devices that will enable her to improve independence and safety during daily tasks.     Time  4    Period  Weeks     Status  On-going      OT SHORT TERM GOAL #3   Title  Patient will be educated on energy conservation techniques in order to be able to complete daily tasks with improved activity tolerance.     Time  4    Period  Weeks    Status  On-going      OT SHORT TERM GOAL #4   Title  Patient will decrease right shoulder pain to 6/10 or lower for improved quality of life.    Time  4    Period  Weeks    Status  On-going      OT SHORT TERM GOAL #5   Title  Patient will become educated on adaptive strategies to incorportate RUE to assist with bilateral functional tasks.     Time  4    Period  Weeks    Status  On-going               Plan - 03/25/18 1151    Clinical Impression Statement  A:  Skilled treatment focused on decreasing heightened pain in right shoulder due to fall yesterday.  Patient with 10/10 pain at beginning of session.  After skilled manual therapy and development of reach activity, ended session with heat.  Patient states pain decreased to 7/10 after therapist intervention.      Plan  P:  attempt weightbearing on forearm for decreased tightness and tone in right shoulder region.         Patient will benefit from skilled therapeutic intervention in order to improve the following deficits and impairments:  Decreased activity tolerance, Decreased knowledge of use of DME, Decreased strength, Impaired flexibility, Decreased mobility, Decreased range of motion, Pain, Decreased coordination, Increased fascial restrictions, Impaired UE functional use, Impaired tone  Visit Diagnosis: Other symptoms and signs involving the musculoskeletal system  Other lack of coordination  Hemiplegia and hemiparesis following cerebral infarction affecting right dominant side (HCC)  Acute pain of right shoulder    Problem List Patient Active Problem List   Diagnosis Date Noted  . Hypothyroidism 11/29/2017  . Cerebrovascular accident (CVA) (HCC)   . Ischemic stroke (HCC)   . Type 2  diabetes mellitus  with hyperglycemia (HCC) 11/25/2017  . HLD (hyperlipidemia) 11/25/2017  . Fibromyalgia 11/25/2017  . Depression 11/25/2017  . Tobacco abuse 11/25/2017  . Papule 03/22/2015  . Hypertension   . Acute diverticulitis 11/08/2014  . Diabetes (HCC) 11/08/2014  . Renal insufficiency 11/08/2014  . Abdominal pain 11/08/2014  . Left leg weakness 04/10/2011  . Abnormal gait 04/10/2011  . S/P total knee replacement 12/28/2010  . Knee pain 12/28/2010    Shirlean MylarBethany H. Tytianna Greenley, Alaska[place]HA, OTR/L [phone]  03/25/2018, 11:58 AM  [facility] [address] [place], [place], [address] Phone: [phone]   Fax:  [phone]  Name: [name] MRN: [id number] Date of Birth: 12/06/1955

## 2018-03-25 NOTE — Therapy (Signed)
[facility] [address] [place], [place], [address] Phone: [phone]   Fax:  [phone]  Physical Therapy Treatment  Patient Details  Name: EMMALINA ESPERICUETA MRN: [id number] Date of Birth: 18-Jul-1955 Referring Provider (PT): Benita Stabile, MD   Encounter Date: 03/25/2018  PT End of Session - 03/25/18 1040    Visit Number  12    Number of Visits  17    Date for PT Re-Evaluation  04/09/18   Minireassess complete 03/12/18   Authorization Type  [employer]na Medicare HMO    Authorization Time Period  02/12/18 to 04/09/18    Authorization - Visit Number  12    Authorization - Number of Visits  17    PT Start Time  1030    PT Stop Time  1112    PT Time Calculation (min)  42 min    Equipment Utilized During Treatment  Gait belt    Activity Tolerance  Patient tolerated treatment well;Patient limited by fatigue;Patient limited by pain   Rt UE pain   Behavior During Therapy  [facility] for tasks assessed/performed       Past Medical History:  Diagnosis Date  . Diabetes mellitus without complication (HCC)   . Diverticulitis   . Fibromyalgia   . Hyperlipidemia   . Hypertension   . Papule 03/22/2015  . Stroke [facility])     Past Surgical History:  Procedure Laterality Date  . CHOLECYSTECTOMY    . KNEE SURGERY Left     There were no vitals filed for this visit.  Subjective Assessment - 03/25/18 1035    Subjective  Pt reports she fell yesterday while in her bedroom She is not sure what caused the fall but she fell forward onto her bed with her R arm stretched out in front of her. She had to slide onto the floor to get her arm repositioned and then her husband assited her back to her feet. Her R arm is hurting from this.     Patient Stated Goals  be able to walk better    Currently in Pain?  Yes    Pain Score  10-Worst pain ever    Pain Location  Shoulder    Pain Orientation  Right    Pain Descriptors / Indicators  Sore;Constant    Pain Type   Acute pain    Pain Onset  More than a month ago    Pain Frequency  Constant    Aggravating Factors   n/a    Pain Relieving Factors  pain relief gel    Effect of Pain on Daily Activities  max effect on daily tasks          [facility] Adult PT Treatment/Exercise - 03/25/18 0001      Exercises   Exercises  Knee/Hip      Knee/Hip Exercises: Supine   Knee Flexion  Left;15 reps    Knee Flexion Limitations  cues for eccentric control    Other Supine Knee/Hip Exercises  RLE D1 and D2 flexion PNF x20reps each (PROM and AAROM)      Modalities   Modalities  Electrical Stimulation      Electrical Stimulation   Electrical Stimulation Location  R peroneal mm for strength of ankle eversion    Electrical Stimulation Action  Russian x90mins    Electrical Stimulation Parameters  52mA    Electrical Stimulation Goals  Strength;Neuromuscular facilitation          Balance Exercises - 03/25/18  1118      Balance Exercises: Standing   Standing Eyes Closed  Narrow base of support (BOS);Foam/compliant surface;3 reps;10 secs    Cone Rotation  Solid surface;Intermittent upper extremity assist;Right turn;Left turn   x6 cones   Lift / Chop Limitations  standing cone taps x3RT each (no UE when tapping R, 1 UE when tapping L this date)              PT Education - 03/25/18 1040    Education Details  exercise technique, continue HEP    Person(s) Educated  Patient    Methods  Explanation;Demonstration    Comprehension  Verbalized understanding;Returned demonstration       PT Short Term Goals - 03/12/18 1035      PT SHORT TERM GOAL #1   Title  Pt will be independent with HEP and perform consistently in order to maximize strength, balance, and independence at home.    Time  4    Period  Weeks    Status  Achieved      PT SHORT TERM GOAL #2   Title  Pt will have improved MMT by 1/2 grade in order to maximize transfers, gait, and balance.    Time  4    Period  Weeks    Status  On-going       PT SHORT TERM GOAL #3   Title  Pt will have improved BERG balance score to 40/56 to demo improved balance and decreased risk for falls.    Time  4    Period  Weeks    Status  On-going        PT Long Term Goals - 03/12/18 1036      PT LONG TERM GOAL #1   Title  Pt will have improved MMT by 1 grade throughout in order to further maximize functional mobility, balance, and decrease her risk for falls.    Time  8    Period  Weeks    Status  On-going      PT LONG TERM GOAL #2   Title  Pt will have improved BERG balance score to 46/56 in order to further demo improved balance and decreased risk for falls.    Time  8    Period  Weeks    Status  On-going      PT LONG TERM GOAL #3   Title  Pt will be able to perform the TUG in 45sec or better with LRAD to demo improved functional mobility and balance in order to maximize [facility] ambulation.     Time  8    Period  Weeks    Status  On-going      PT LONG TERM GOAL #4   Title  Pt will have improved 5xSTS to 15sec or < without UE to demo improved functional strength and mobility.    Time  8    Period  Weeks    Status  On-going      PT LONG TERM GOAL #5   Title  Pt will have improved by 148ft or > with LRAD, mod I, and without evidence of R toe drag in order to demo improved functional mobility and maximize her community ambulation.     Time  8    Period  Weeks    Status  On-going            Plan - 03/25/18 1116    Clinical Impression Statement  Pt presents to therapy stating  that she sustained a fall yesterday in home; she stated she fell onto her RUE with it out stretched and it is sore today. Continued with established POC focusing on R peroneal mm strength, functional strength, and balance. She continues to be challenged with balance activities due to weakness and unsteadiness. Added NBOS balance on foam with EC and cone rotation in tandem stance on firm. Pt generally unsteady and required min guard/assistance for steadiness.  Pt tolerated session well, no reports of increased pain. Continue as planned, progressing as able.     Rehab Potential  Good    PT Frequency  2x / week    PT Duration  8 weeks    PT Treatment/Interventions  ADLs/Self Care Home Management;Aquatic Therapy;Cryotherapy;Electrical Stimulation;Moist Heat;Ultrasound;DME Instruction;Gait training;Functional mobility Network engineertraining;Stair training;Therapeutic activities;Therapeutic exercise;Balance training;Neuromuscular re-education;Patient/family education;Orthotic Fit/Training;Manual techniques;Wheelchair mobility training;Scar mobilization;Passive range of motion;Dry needling;Energy conservation;Taping    PT Next Visit Plan  contniue Guern[place]ian to R peroneals; Continue BLE and functional strengthening, balance work, R HS and gastroc strengthening, and coordination of RLE; f/u on AFO    PT Home Exercise Plan  eval: bridging, supine march, supine clams RTB 10/22: adduction, LAQ, heel/toe raises; 11/5: seated heel slides, supine heel slides, standing knee flexion; 11/19: sidelying clams, seated ankle eversion    Consulted and Agree with Plan of Care  Patient       Patient will benefit from skilled therapeutic intervention in order to improve the following deficits and impairments:  Abnormal gait, Decreased activity tolerance, Decreased balance, Decreased coordination, Decreased endurance, Decreased mobility, Decreased range of motion, Decreased strength, Difficulty walking, Increased muscle spasms, Impaired flexibility, Impaired UE functional use  Visit Diagnosis: Muscle weakness (generalized)  Difficulty in walking, not elsewhere classified  Unsteadiness on feet     Problem List Patient Active Problem List   Diagnosis Date Noted  . Hypothyroidism 11/29/2017  . Cerebrovascular accident (CVA) (HCC)   . Ischemic stroke (HCC)   . Type 2 diabetes mellitus with hyperglycemia (HCC) 11/25/2017  . HLD (hyperlipidemia) 11/25/2017  . Fibromyalgia 11/25/2017  .  Depression 11/25/2017  . Tobacco abuse 11/25/2017  . Papule 03/22/2015  . Hypertension   . Acute diverticulitis 11/08/2014  . Diabetes (HCC) 11/08/2014  . Renal insufficiency 11/08/2014  . Abdominal pain 11/08/2014  . Left leg weakness 04/10/2011  . Abnormal gait 04/10/2011  . S/P total knee replacement 12/28/2010  . Knee pain 12/28/2010        Jac CanavanBrooke Wasim Hurlbut PT, DPT  [facility] [address] [place], [place], [address] Phone: [phone]   Fax:  [phone]  Name: Forbes CellarVirginia T Drahos MRN: [id number] Date of Birth: 09/09/1955

## 2018-03-26 ENCOUNTER — Encounter (HOSPITAL_COMMUNITY): Payer: Self-pay | Admitting: Occupational Therapy

## 2018-03-26 ENCOUNTER — Ambulatory Visit (HOSPITAL_COMMUNITY): Payer: Medicare HMO | Admitting: Occupational Therapy

## 2018-03-26 DIAGNOSIS — R262 Difficulty in walking, not elsewhere classified: Secondary | ICD-10-CM | POA: Diagnosis not present

## 2018-03-26 DIAGNOSIS — R29898 Other symptoms and signs involving the musculoskeletal system: Secondary | ICD-10-CM | POA: Diagnosis not present

## 2018-03-26 DIAGNOSIS — R2681 Unsteadiness on feet: Secondary | ICD-10-CM | POA: Diagnosis not present

## 2018-03-26 DIAGNOSIS — M25511 Pain in right shoulder: Secondary | ICD-10-CM | POA: Diagnosis not present

## 2018-03-26 DIAGNOSIS — I69351 Hemiplegia and hemiparesis following cerebral infarction affecting right dominant side: Secondary | ICD-10-CM

## 2018-03-26 DIAGNOSIS — R278 Other lack of coordination: Secondary | ICD-10-CM | POA: Diagnosis not present

## 2018-03-26 DIAGNOSIS — M6281 Muscle weakness (generalized): Secondary | ICD-10-CM | POA: Diagnosis not present

## 2018-03-26 NOTE — Therapy (Signed)
[facility] [address] [place], [place], [address] Phone: [phone]   Fax:  [phone]  Occupational Therapy Treatment  Patient Details  Name: [name] MRN: [id number] Date of Birth: December 15, 1955 Referring Provider (OT): Nita Sells, MD   Encounter Date: 03/26/2018  OT End of Session - 03/26/18 1515    Visit Number  7    Number of Visits  8    Date for OT Re-Evaluation  04/02/18    Authorization Type  1) [employer] HMO; $40 copay    OT Start Time  1033    OT Stop Time  1115    OT Time Calculation (min)  42 min    Activity Tolerance  Patient tolerated treatment well    Behavior During Therapy  [facility] for tasks assessed/performed       Past Medical History:  Diagnosis Date  . Diabetes mellitus without complication (HCC)   . Diverticulitis   . Fibromyalgia   . Hyperlipidemia   . Hypertension   . Papule 03/22/2015  . Stroke [facility])     Past Surgical History:  Procedure Laterality Date  . CHOLECYSTECTOMY    . KNEE SURGERY Left     There were no vitals filed for this visit.  Subjective Assessment - 03/26/18 1031    Subjective   S: My arms feels much better today than yesterday.     Currently in Pain?  Yes    Pain Score  3     Pain Location  Shoulder    Pain Orientation  Right    Pain Descriptors / Indicators  Sore    Pain Type  Acute pain    Pain Radiating Towards  upper arm    Pain Onset  More than a month ago    Pain Frequency  Constant    Aggravating Factors   fall x2 days ago    Pain Relieving Factors  heat, rest    Effect of Pain on Daily Activities  max effect on ADL participation    Multiple Pain Sites  No         [facility] OT Assessment - 03/26/18 1030      Assessment   Medical Diagnosis  Right hemiparesis s/p CVA      Precautions   Precautions  Fall               OT Treatments/Exercises (OP) - 03/26/18 1035      Exercises   Exercises  Shoulder      Shoulder Exercises: Stretch   Elbow Flexion  PROM;5 reps;AROM;10 reps   OT providing min assist for above 90 degrees     Neurological Re-education Exercises   Scapular Stabilization  Right;10 reps;Seated    Other Information  Min facilitation for shoulder elevation and protraction/retraction to complete full motion    Elbow Extension  PROM;5 reps;AROM;10 reps   closed chain active extension   Wrist Flexion  PROM;5 reps    Wrist Extension  PROM;5 reps    Finger Flexion  During off ramp of ES pt actively flexing digits and squeezing towel roll.     Finger Extension  P/ROM full digit extension 5 reps. Using ES machine pt working to actively extend digits during on ramp    Weight Bearing Position  Standing    Standing with weight shifting on and off  Pt standing at mat table with weight on forearm reaching and placing bean bags to her far right side. OT providing min  guard for balance.     Other Weight-Bearing Exercises 1  Pt in full standing with palm flat on mat table and OT providing mod assist for stability at elbow.     Development of Reach  Closed chain    Closed Chain Exercises  in sitting, pt sliding bean bags across table attempting to reach into protraction/flexion. As pt became fatigued reaching more into horizontal abduction versus protraction or flexion, OT providing min guidance to prevent excessive horiziontal abduction      Insurance claims handlerlectrical Stimulation   Electrical Stimulation Location  rigth dorsal forearm targeting extensors    Electrical Stimulation Action  Russian, 10 minutes    Electrical Stimulation Parameters  38 mA CC    Electrical Stimulation Goals  Strength;Neuromuscular facilitation               OT Short Term Goals - 03/13/18 1146      OT SHORT TERM GOAL #1   Title  Patient will be educated and independent with HEP to faciliate progress in therapy and allow her to increase functional independence with ADLs.     Time  4    Period  Weeks    Status  On-going      OT SHORT TERM GOAL #2    Title  Patient will become educated and independent with compensatory techniques and assistive devices that will enable her to improve independence and safety during daily tasks.     Time  4    Period  Weeks    Status  On-going      OT SHORT TERM GOAL #3   Title  Patient will be educated on energy conservation techniques in order to be able to complete daily tasks with improved activity tolerance.     Time  4    Period  Weeks    Status  On-going      OT SHORT TERM GOAL #4   Title  Patient will decrease right shoulder pain to 6/10 or lower for improved quality of life.    Time  4    Period  Weeks    Status  On-going      OT SHORT TERM GOAL #5   Title  Patient will become educated on adaptive strategies to incorportate RUE to assist with bilateral functional tasks.     Time  4    Period  Weeks    Status  On-going               Plan - 03/26/18 1500    Clinical Impression Statement  A: Pt reporting decreased arm pain since her fall. Resumed weightbearing to work towards reducing tone in RUE. Also resumed ES for neuromuscular facilitation for wrist and hand extensors, pt able to contract muscle and minimally extend wrist 1x gravity eliminated after ES completed. Pt able to actively extend elbow against OT hand to push into extension, min assist to extend versus internally rotate. Verbal cuing for form during exercises.      Plan  P: Reassessment and recertification. Continue with weightbearing activity/task to reduce tone in RUE prior to ES use. Continue with closed chain or AA/ROM tasks for RUE functional task       Patient will benefit from skilled therapeutic intervention in order to improve the following deficits and impairments:  Decreased activity tolerance, Decreased knowledge of use of DME, Decreased strength, Impaired flexibility, Decreased mobility, Decreased range of motion, Pain, Decreased coordination, Increased fascial restrictions, Impaired UE functional use, Impaired  tone  Visit Diagnosis: Other symptoms and signs involving the musculoskeletal system  Other lack of coordination  Hemiplegia and hemiparesis following cerebral infarction affecting right dominant side (HCC)  Acute pain of right shoulder    Problem List Patient Active Problem List   Diagnosis Date Noted  . Hypothyroidism 11/29/2017  . Cerebrovascular accident (CVA) (HCC)   . Ischemic stroke (HCC)   . Type 2 diabetes mellitus with hyperglycemia (HCC) 11/25/2017  . HLD (hyperlipidemia) 11/25/2017  . Fibromyalgia 11/25/2017  . Depression 11/25/2017  . Tobacco abuse 11/25/2017  . Papule 03/22/2015  . Hypertension   . Acute diverticulitis 11/08/2014  . Diabetes (HCC) 11/08/2014  . Renal insufficiency 11/08/2014  . Abdominal pain 11/08/2014  . Left leg weakness 04/10/2011  . Abnormal gait 04/10/2011  . S/P total knee replacement 12/28/2010  . Knee pain 12/28/2010   Ezra Sites, OTR/L  [phone] 03/26/2018, 3:16 PM  [facility] [address] [place], [place], [address] Phone: [phone]   Fax:  [phone]  Name: [name] MRN: [id number] Date of Birth: Dec 10, 1955

## 2018-03-27 ENCOUNTER — Encounter (HOSPITAL_COMMUNITY): Payer: Self-pay

## 2018-03-27 ENCOUNTER — Ambulatory Visit (HOSPITAL_COMMUNITY): Payer: Medicare HMO

## 2018-03-27 DIAGNOSIS — M6281 Muscle weakness (generalized): Secondary | ICD-10-CM | POA: Diagnosis not present

## 2018-03-27 DIAGNOSIS — M25511 Pain in right shoulder: Secondary | ICD-10-CM | POA: Diagnosis not present

## 2018-03-27 DIAGNOSIS — R2681 Unsteadiness on feet: Secondary | ICD-10-CM | POA: Diagnosis not present

## 2018-03-27 DIAGNOSIS — R29898 Other symptoms and signs involving the musculoskeletal system: Secondary | ICD-10-CM | POA: Diagnosis not present

## 2018-03-27 DIAGNOSIS — R278 Other lack of coordination: Secondary | ICD-10-CM | POA: Diagnosis not present

## 2018-03-27 DIAGNOSIS — R262 Difficulty in walking, not elsewhere classified: Secondary | ICD-10-CM | POA: Diagnosis not present

## 2018-03-27 DIAGNOSIS — I69351 Hemiplegia and hemiparesis following cerebral infarction affecting right dominant side: Secondary | ICD-10-CM | POA: Diagnosis not present

## 2018-03-27 NOTE — Therapy (Signed)
[facility] [address] [place], [place], [address] Phone: [phone]   Fax:  [phone]  Physical Therapy Treatment  Patient Details  Name: [name] MRN: [id number] Date of Birth: [birth year] Referring Provider (PT): Benita Stabile, MD   Encounter Date: 03/27/2018  PT End of Session - 03/27/18 1115    Visit Number  13    Number of Visits  17    Date for PT Re-Evaluation  04/09/18   Minireassess complete 03/12/18   Authorization Type  [employer] Medicare HMO    Authorization Time Period  02/12/18 to 04/09/18    Authorization - Visit Number  13    Authorization - Number of Visits  17    PT Start Time  1105    PT Stop Time  1151    PT Time Calculation (min)  46 min    Equipment Utilized During Treatment  Gait belt    Activity Tolerance  Patient tolerated treatment well;Patient limited by fatigue;Patient limited by pain   Rt UE pain   Behavior During Therapy  [facility] for tasks assessed/performed       Past Medical History:  Diagnosis Date  . Diabetes mellitus without complication (HCC)   . Diverticulitis   . Fibromyalgia   . Hyperlipidemia   . Hypertension   . Papule 03/22/2015  . Stroke [facility])     Past Surgical History:  Procedure Laterality Date  . CHOLECYSTECTOMY    . KNEE SURGERY Left     There were no vitals filed for this visit.  Subjective Assessment - 03/27/18 1116    Subjective  Pt states that her shoulder feels better. No more reports of close calls with falls.     Patient Stated Goals  be able to walk better    Currently in Pain?  Yes    Pain Score  2     Pain Location  Shoulder    Pain Orientation  Right    Pain Descriptors / Indicators  Sore    Pain Type  Acute pain    Pain Onset  More than a month ago    Pain Frequency  Constant    Aggravating Factors   moving it    Pain Relieving Factors  heat, rest    Effect of Pain on Daily Activities  max effect on ADL participation          [facility] Adult PT  Treatment/Exercise - 03/27/18 0001      Exercises   Exercises  Knee/Hip      Knee/Hip Exercises: Standing   Forward Step Up  Both;10 reps;Hand Hold: 1;Step Height: 4"    Forward Step Up Limitations  manual assistance to reudce knee hyperextension/snapping back      Knee/Hip Exercises: Supine   Knee Flexion  Left;20 reps    Knee Flexion Limitations  cues for eccentric control    Other Supine Knee/Hip Exercises  RLE D1 and D2 flexion PNF x20reps each (PROM and AAROM)      Modalities   Modalities  Electrical Stimulation      Electrical Stimulation   Electrical Stimulation Location  R peroneal mm for ankle eversion strength    Electrical Stimulation Action  Russian x10 mins    Electrical Stimulation Parameters  56mA    Electrical Stimulation Goals  Strength;Neuromuscular facilitation      Balance Exercises - 03/27/18 1154      Balance Exercises: Standing   Partial Tandem Stance  Eyes closed;Foam/compliant surface;Intermittent  upper extremity support;5 reps;10 secs   BLE   Marching Limitations  x10 reps foam no UE, manual assistance to prevent R knee snapping back/hyperextension              PT Education - 03/27/18 1116    Education Details  exercise technique, continue HEP    Person(s) Educated  Patient    Methods  Explanation;Demonstration    Comprehension  Verbalized understanding;Returned demonstration       PT Short Term Goals - 03/12/18 1035      PT SHORT TERM GOAL #1   Title  Pt will be independent with HEP and perform consistently in order to maximize strength, balance, and independence at home.    Time  4    Period  Weeks    Status  Achieved      PT SHORT TERM GOAL #2   Title  Pt will have improved MMT by 1/2 grade in order to maximize transfers, gait, and balance.    Time  4    Period  Weeks    Status  On-going      PT SHORT TERM GOAL #3   Title  Pt will have improved BERG balance score to 40/56 to demo improved balance and decreased risk for falls.     Time  4    Period  Weeks    Status  On-going        PT Long Term Goals - 03/12/18 1036      PT LONG TERM GOAL #1   Title  Pt will have improved MMT by 1 grade throughout in order to further maximize functional mobility, balance, and decrease her risk for falls.    Time  8    Period  Weeks    Status  On-going      PT LONG TERM GOAL #2   Title  Pt will have improved BERG balance score to 46/56 in order to further demo improved balance and decreased risk for falls.    Time  8    Period  Weeks    Status  On-going      PT LONG TERM GOAL #3   Title  Pt will be able to perform the TUG in 45sec or better with LRAD to demo improved functional mobility and balance in order to maximize [facility] ambulation.     Time  8    Period  Weeks    Status  On-going      PT LONG TERM GOAL #4   Title  Pt will have improved 5xSTS to 15sec or < without UE to demo improved functional strength and mobility.    Time  8    Period  Weeks    Status  On-going      PT LONG TERM GOAL #5   Title  Pt will have improved by 143ft or > with LRAD, mod I, and without evidence of R toe drag in order to demo improved functional mobility and maximize her community ambulation.     Time  8    Period  Weeks    Status  On-going            Plan - 03/27/18 1159    Clinical Impression Statement  PT noted pt to have very slight contraction of peroneals actively this date so continued with [place] estim to this mm in order to further improve strength and maximize gait and balance. Continued with supine heel slides for R HS strengthening; she still  demo's decreased control with descent/eccentric control. Able to perform step ups this date; she still had difficulty with coordination/proprioception of placing RLE but with cues and focus she was able to properly/accurately place it ~50% of the time. Added marching on foam without UE support; generally unsteady but able to maintain balance. PT provided manual assistance to  reduce R knee snapping throughout entire session. Continue as planned, progressing as able    Rehab Potential  Good    PT Frequency  2x / week    PT Duration  8 weeks    PT Treatment/Interventions  ADLs/Self Care Home Management;Aquatic Therapy;Cryotherapy;Electrical Stimulation;Moist Heat;Ultrasound;DME Instruction;Gait training;Functional mobility Network engineertraining;Stair training;Therapeutic activities;Therapeutic exercise;Balance training;Neuromuscular re-education;Patient/family education;Orthotic Fit/Training;Manual techniques;Wheelchair mobility training;Scar mobilization;Passive range of motion;Dry needling;Energy conservation;Taping    PT Next Visit Plan  contniue [place]ian to R peroneals; Continue BLE and functional strengthening, balance work, R HS and gastroc strengthening, and coordination of RLE; f/u on AFO    PT Home Exercise Plan  eval: bridging, supine march, supine clams RTB 10/22: adduction, LAQ, heel/toe raises; 11/5: seated heel slides, supine heel slides, standing knee flexion; 11/19: sidelying clams, seated ankle eversion    Consulted and Agree with Plan of Care  Patient       Patient will benefit from skilled therapeutic intervention in order to improve the following deficits and impairments:  Abnormal gait, Decreased activity tolerance, Decreased balance, Decreased coordination, Decreased endurance, Decreased mobility, Decreased range of motion, Decreased strength, Difficulty walking, Increased muscle spasms, Impaired flexibility, Impaired UE functional use  Visit Diagnosis: Muscle weakness (generalized)  Difficulty in walking, not elsewhere classified  Unsteadiness on feet     Problem List Patient Active Problem List   Diagnosis Date Noted  . Hypothyroidism 11/29/2017  . Cerebrovascular accident (CVA) (HCC)   . Ischemic stroke (HCC)   . Type 2 diabetes mellitus with hyperglycemia (HCC) 11/25/2017  . HLD (hyperlipidemia) 11/25/2017  . Fibromyalgia 11/25/2017  . Depression  11/25/2017  . Tobacco abuse 11/25/2017  . Papule 03/22/2015  . Hypertension   . Acute diverticulitis 11/08/2014  . Diabetes (HCC) 11/08/2014  . Renal insufficiency 11/08/2014  . Abdominal pain 11/08/2014  . Left leg weakness 04/10/2011  . Abnormal gait 04/10/2011  . S/P total knee replacement 12/28/2010  . Knee pain 12/28/2010       Jac CanavanBrooke Jhane Lorio PT, DPT  [facility] [address] [place], [place], [address] Phone: [phone]   Fax:  [phone]  Name: [name] MRN: [id number] Date of Birth: 06/22/1955

## 2018-04-02 ENCOUNTER — Ambulatory Visit (HOSPITAL_COMMUNITY): Payer: Medicare HMO | Attending: Internal Medicine | Admitting: Occupational Therapy

## 2018-04-02 ENCOUNTER — Encounter (HOSPITAL_COMMUNITY): Payer: Self-pay | Admitting: Occupational Therapy

## 2018-04-02 DIAGNOSIS — R29898 Other symptoms and signs involving the musculoskeletal system: Secondary | ICD-10-CM

## 2018-04-02 DIAGNOSIS — R278 Other lack of coordination: Secondary | ICD-10-CM | POA: Insufficient documentation

## 2018-04-02 DIAGNOSIS — M25511 Pain in right shoulder: Secondary | ICD-10-CM | POA: Diagnosis not present

## 2018-04-02 DIAGNOSIS — I639 Cerebral infarction, unspecified: Secondary | ICD-10-CM | POA: Diagnosis not present

## 2018-04-02 DIAGNOSIS — M6281 Muscle weakness (generalized): Secondary | ICD-10-CM | POA: Insufficient documentation

## 2018-04-02 DIAGNOSIS — I69351 Hemiplegia and hemiparesis following cerebral infarction affecting right dominant side: Secondary | ICD-10-CM | POA: Diagnosis not present

## 2018-04-02 DIAGNOSIS — R2681 Unsteadiness on feet: Secondary | ICD-10-CM | POA: Diagnosis not present

## 2018-04-02 DIAGNOSIS — R262 Difficulty in walking, not elsewhere classified: Secondary | ICD-10-CM | POA: Insufficient documentation

## 2018-04-02 NOTE — Patient Instructions (Signed)
SHOULDER: Flexion On Table   Place hands on towel placed on table, elbows straight. Lean forward with you upper body, pushing towel away from body. Hold _5__ seconds. _10__ reps per set, _3__ sets per day   2) Elbow flexion and Extension-Self-ROM  Interlock fingers together in a prayer pose. Let left arm assist right arm and bend elbows up, then straighten back down. Hold 5 seconds, 10 reps per set, 3 sets per day                  3) While lying on your back with arm straight by your side, allow arm to relax in extension. While in this position practice extending wrist-complete 5 times, take a break, and complete 5 more times.

## 2018-04-02 NOTE — Therapy (Signed)
[place], [place], [address] Phone: [phone]   Fax:  [phone]  Occupational Therapy Reassessment, Treatment (recertification)  Patient Details  Name: [name] MRN: [id number] Date of Birth: 06-28-1955 Referring Provider (OT): Allyn Kenner, MD  Progress Note Reporting Period 03/05/2018  to 04/02/2018  See note below for Objective Data and Assessment of Progress/Goals.       Encounter Date: 04/02/2018  OT End of Session - 04/02/18 1305    Visit Number  8    Number of Visits  24    Date for OT Re-Evaluation  06/01/18   mini-reassessment 05/02/2018   Authorization Type  1) [employer] HMO; $40 copay    OT Start Time  [phone]    OT Stop Time  1031    OT Time Calculation (min)  49 min    Activity Tolerance  Patient tolerated treatment well    Behavior During Therapy  [facility] for tasks assessed/performed       Past Medical History:  Diagnosis Date  . Diabetes mellitus without complication ([place])   . Diverticulitis   . Fibromyalgia   . Hyperlipidemia   . Hypertension   . Papule 03/22/2015  . Stroke [facility])     Past Surgical History:  Procedure Laterality Date  . CHOLECYSTECTOMY    . KNEE SURGERY Left     There were no vitals filed for this visit.  Subjective Assessment - 04/02/18 0946    Subjective   S: My arm just hurts all the time.     Currently in Pain?  Yes    Pain Score  6     Pain Location  Shoulder    Pain Orientation  Right    Pain Descriptors / Indicators  Aching;Sore    Pain Type  Acute pain    Pain Radiating Towards  upper arm    Pain Onset  More than a month ago    Pain Frequency  Constant    Aggravating Factors   moving it    Pain Relieving Factors  heat, rest    Effect of Pain on Daily Activities  max effect on ADL completion    Multiple Pain Sites  No         [facility] OT Assessment - 04/02/18 0939      Assessment   Medical Diagnosis  Right hemiparesis s/p CVA      Precautions   Precautions  Fall      Coordination   Coordination and Movement Description  unable to test coordination in RUE due to lack of movement      Tone   Assessment Location  Right Upper Extremity      ROM / Strength   AROM / PROM / Strength  AROM;PROM;Strength      Palpation   Palpation comment  mod fascial restrictions palpated in bicep, upper arm, and trapezius regions      AROM   Overall AROM Comments  Patient presents with some active scapular elevation and retraction although less than right side. Slight active movement of elbow flexion, as well as wrist extension while in supine position.       PROM   Overall PROM Comments  Able to tolerate P/ROM to slightly above 90 degrees, painful after this point. Patient is able to tolerate shoulder flexion to 90 degrees before reports of pain. Full passive elbow flexion and extension. Wrist flexion and extension are slightly limited due to increased tone and pain.  Strength   Overall Strength Comments  Unable to test grip and pinch strength    Strength Assessment Site  Shoulder;Elbow;Forearm;Wrist    Right/Left Shoulder  Right    Right Shoulder Flexion  0/5   same as previous    Right Shoulder Extension  1/5   0/5 previous   Right Shoulder ABduction  0/5   same as previous    Right Shoulder Internal Rotation  0/5   same as previous    Right Shoulder External Rotation  0/5   same as previous    Right Shoulder Horizontal ABduction  1/5   0/5 previous   Right Shoulder Horizontal ADduction  1/5   0/5 previous   Right/Left Elbow  Right    Right Elbow Flexion  1/5   0/5 previous   Right Elbow Extension  1/5   0/5 previous   Right/Left Forearm  Right    Right Forearm Pronation  0/5   same as previous    Right Forearm Supination  0/5   same as previous    Right/Left Wrist  Right    Right Wrist Flexion  2-/5   1/5 previous   Right Wrist Extension  1/5   0/5 previous   Right Wrist Radial Deviation  0/5   same as  previous    Right Wrist Ulnar Deviation  0/5   same as previous      RUE Tone   RUE Tone  Brunnstrom Scale    Brunnstrom Scale (RUE)  More marked increase in muscle tone through most of the ROM, but affected part(s) easily moved               OT Treatments/Exercises (OP) - 04/02/18 1305      Exercises   Exercises  Shoulder      Neurological Re-education Exercises   Scapular Stabilization  Right;10 reps;Seated    Other Information  Min facilitation for shoulder elevation and protraction/retraction to complete full motion    Elbow Extension  PROM;5 reps   elbow flexion 5 reps   Wrist Flexion  PROM;5 reps    Wrist Extension  PROM;5 reps;AROM;10 reps   A/ROM while supine and arm in full extension by side   Finger Flexion  During off ramp of ES pt actively flexing digits    Finger Extension  Using ES machine pt able to achieve digit and wrist extension during on ramp    Weight Bearing Position  Seated    Seated with weight on forearm  Pt leaning over onto forearm for weightbearing, good form, 2x 1' hold.     Seated with weight on hand  Pt sat with RUE abducted slightly and hand placed at edge of mat table for weightbearing. OT providing stabilization at hand and elbow to prevent buckling. 1' hold, 1X      Modalities   Modalities  Teacher, English as a foreign language Location  rigth dorsal forearm targeting extensors    Chartered certified accountant  Russian, 10 minutes    Electrical Stimulation Parameters  38 mA CC    Electrical Stimulation Goals  Strength;Neuromuscular facilitation      Manual Therapy   Manual Therapy  Myofascial release    Myofascial Release  myofascial release completed to right upper arm at bicep region during ES on wrist extensors, working on pain reduction to facilitate improved movement and tolerance to activities.  OT Education - 04/02/18 1325    Education Details  reviewed weightbearing  HEP, added AA/ROM for elbow flexion/extension, towel slide shoulder flexion, lying supine for wrist extension and tone reduction    Person(s) Educated  Patient    Methods  Explanation;Demonstration;Handout    Comprehension  Verbalized understanding;Returned demonstration       OT Short Term Goals - 04/02/18 1337      OT SHORT TERM GOAL #1   Title  Patient will be educated and independent with HEP to faciliate progress in therapy and allow her to increase functional independence with ADLs.     Time  4    Period  Weeks    Status  On-going    Target Date  05/02/18      OT SHORT TERM GOAL #2   Title  Patient will become educated and independent with compensatory techniques and assistive devices that will enable her to improve independence and safety during daily tasks.     Time  4    Period  Weeks    Status  On-going      OT SHORT TERM GOAL #3   Title  Patient will be educated on energy conservation techniques in order to be able to complete daily tasks with improved activity tolerance.     Time  4    Period  Weeks    Status  On-going      OT SHORT TERM GOAL #4   Title  Patient will decrease right shoulder pain to 6/10 or lower for improved quality of life.    Time  4    Period  Weeks    Status  On-going      OT SHORT TERM GOAL #5   Title  Patient will become educated on adaptive strategies to incorportate RUE to assist with bilateral functional tasks.     Time  4    Period  Weeks    Status  On-going        OT Long Term Goals - 04/02/18 1332      OT LONG TERM GOAL #1   Title  Patient will return to her highest level of independence while being able to utilize her RUE as an active assist for 50% or more of daily tasks.      Time  8    Period  Weeks    Status  New    Target Date  06/01/18      OT LONG TERM GOAL #2   Title  Patient will increase her motor coordination by completing a standardize test in order to use her right hand for feeding and grooming tasks.       Time  8    Period  Weeks    Status  New      OT LONG TERM GOAL #3   Title  Patient will increase her RUE strength to 3/5 in order to complete waist level functional daily tasks.    Time  8    Period  Weeks    Status  New      OT LONG TERM GOAL #4   Title  Patient will increase her right hand grip strength to 10# and pinch strength to 4# in order to hold onto eating utensils without dropping.     Time  8    Period  Weeks    Status  New      OT LONG TERM GOAL #5   Title  Patient will increase A/ROM of  RUE to [facility] in order to complete dressing tasks with supervision as needed.      Time  8    Period  Weeks    Status  New            Plan - 04/02/18 1326    Clinical Impression Statement  A: Reassessment completed this session, pt has not met any goals however is making slow progress towards achieving goals. Pt reporting pain in bicep region today, reports as constant. OT palpated moderate sized muscle knot in this area, manual therapy completed during NMES on wrist to reduce pain and promote greater tolerance to exercises. After weightbearing and NMES pt able to achieve active wrist extension while in supine with RUE in full extension beside her on mat. Somewhat assisted via flexor tone with effort, however definitive wrist extension achieved on multiple trials.     Plan  P: Continue with skilled OT services to improve independence in ADLs and improve use of RUE as assist during ADL completion. Next session: provide information on home NMES machine to promote continued improvement of muscle activation. Follow up on ES for bicep pain       Patient will benefit from skilled therapeutic intervention in order to improve the following deficits and impairments:  Decreased activity tolerance, Decreased knowledge of use of DME, Decreased strength, Impaired flexibility, Decreased mobility, Decreased range of motion, Pain, Decreased coordination, Increased fascial restrictions, Impaired UE  functional use, Impaired tone  Visit Diagnosis: Other symptoms and signs involving the musculoskeletal system  Other lack of coordination  Hemiplegia and hemiparesis following cerebral infarction affecting right dominant side (HCC)  Acute pain of right shoulder    Problem List Patient Active Problem List   Diagnosis Date Noted  . Hypothyroidism 11/29/2017  . Cerebrovascular accident (CVA) ([place])   . Ischemic stroke ([place])   . Type 2 diabetes mellitus with hyperglycemia ([place]) 11/25/2017  . HLD (hyperlipidemia) 11/25/2017  . Fibromyalgia 11/25/2017  . Depression 11/25/2017  . Tobacco abuse 11/25/2017  . Papule 03/22/2015  . Hypertension   . Acute diverticulitis 11/08/2014  . Diabetes ([place]) 11/08/2014  . Renal insufficiency 11/08/2014  . Abdominal pain 11/08/2014  . Left leg weakness 04/10/2011  . Abnormal gait 04/10/2011  . S/P total knee replacement 12/28/2010  . Knee pain 12/28/2010   Guadelupe Sabin, OTR/L  [phone] 04/02/2018, 1:37 PM  [place] [address] [place], [place], [address] Phone: [phone]   Fax:  [phone]  Name: JAMYRIA OZANICH MRN: [id number] Date of Birth: 01-12-1956

## 2018-04-04 ENCOUNTER — Ambulatory Visit (HOSPITAL_COMMUNITY): Payer: Medicare HMO | Admitting: Occupational Therapy

## 2018-04-04 ENCOUNTER — Encounter (HOSPITAL_COMMUNITY): Payer: Self-pay | Admitting: Occupational Therapy

## 2018-04-04 DIAGNOSIS — R29898 Other symptoms and signs involving the musculoskeletal system: Secondary | ICD-10-CM | POA: Diagnosis not present

## 2018-04-04 DIAGNOSIS — M25511 Pain in right shoulder: Secondary | ICD-10-CM

## 2018-04-04 DIAGNOSIS — M6281 Muscle weakness (generalized): Secondary | ICD-10-CM | POA: Diagnosis not present

## 2018-04-04 DIAGNOSIS — I10 Essential (primary) hypertension: Secondary | ICD-10-CM | POA: Diagnosis not present

## 2018-04-04 DIAGNOSIS — R262 Difficulty in walking, not elsewhere classified: Secondary | ICD-10-CM | POA: Diagnosis not present

## 2018-04-04 DIAGNOSIS — I69351 Hemiplegia and hemiparesis following cerebral infarction affecting right dominant side: Secondary | ICD-10-CM | POA: Diagnosis not present

## 2018-04-04 DIAGNOSIS — E119 Type 2 diabetes mellitus without complications: Secondary | ICD-10-CM | POA: Diagnosis not present

## 2018-04-04 DIAGNOSIS — R2681 Unsteadiness on feet: Secondary | ICD-10-CM | POA: Diagnosis not present

## 2018-04-04 DIAGNOSIS — R69 Illness, unspecified: Secondary | ICD-10-CM | POA: Diagnosis not present

## 2018-04-04 DIAGNOSIS — R278 Other lack of coordination: Secondary | ICD-10-CM

## 2018-04-04 NOTE — Therapy (Signed)
[facility] [address] [place], [place], [id number] Phone: [phone]   Fax:  [phone]  Occupational Therapy Treatment  Patient Details  Name: Michele CellarVirginia T Schaefer MRN: [id number] Date of Birth: 11/16/1955 Referring Provider (OT): Nita SellsJohn Hall, MD   Encounter Date: 04/04/2018  OT End of Session - 04/04/18 1145    Visit Number  9    Number of Visits  24    Date for OT Re-Evaluation  06/01/18   mini-reassessment 05/02/2018   Authorization Type  1) [employer] Medicare HMO; $40 copay    OT Start Time  [phone]    OT Stop Time  1033    OT Time Calculation (min)  43 min    Activity Tolerance  Patient tolerated treatment well    Behavior During Therapy  [facility] for tasks assessed/performed       Past Medical History:  Diagnosis Date  . Diabetes mellitus without complication ([facility])   . Diverticulitis   . Fibromyalgia   . Hyperlipidemia   . Hypertension   . Papule 03/22/2015  . Stroke [facility])     Past Surgical History:  Procedure Laterality Date  . CHOLECYSTECTOMY    . KNEE SURGERY Left     There were no vitals filed for this visit.  Subjective Assessment - 04/04/18 1139    Subjective   S: My arm is really hurting, nothing really helps.     Currently in Pain?  Yes    Pain Score  7     Pain Location  Arm    Pain Orientation  Right;Upper    Pain Descriptors / Indicators  Aching;Sore    Pain Type  Acute pain    Pain Radiating Towards  shoulder    Pain Onset  More than a month ago    Pain Frequency  Constant    Aggravating Factors   moving it    Pain Relieving Factors  heat, rest    Effect of Pain on Daily Activities  max effect on ADL completion    Multiple Pain Sites  No         [facility] OT Assessment - 04/04/18 1139      Assessment   Medical Diagnosis  Right hemiparesis s/p CVA      Precautions   Precautions  Fall               OT Treatments/Exercises (OP) - 04/04/18 1140      Exercises   Exercises  Shoulder      Neurological Re-education Exercises   Elbow Extension  PROM;5 reps;Self ROM;10 reps   elbow flexion 10 reps   Wrist Flexion  PROM;5 reps    Wrist Extension  PROM;AROM;5 reps   A/ROM while supine, arm in full extension by side   Finger Extension  Using ES machine pt able to achieve digit and wrist extension during on ramp    Other Exercises 1  Powder board: pt in sidelying on left side, right arm positioned on powder board. Pt able to complete 5 reps flexion, 5 reps protraction/retraction with max effort. OT positioned at pt's back to prevent use of trunk versus arm. Pt requiring max assist from OT for shoulder extension after bringing arm through flexion.     Weight Bearing Position  Seated    Seated with weight on forearm  Pt leaning over onto forearm for weightbearing, good form, 1x 1' hold.     Seated with weight on hand  Pt sat with RUE  abducted slightly and hand placed at edge of mat table for weightbearing. OT providing stabilization at hand and elbow to prevent buckling. 1' hold, 1X      Modalities   Modalities  Electrical Stimulation;Moist Heat      Moist Heat Therapy   Number Minutes Moist Heat  10 Minutes    Moist Heat Location  Shoulder   right upper arm     Electrical Stimulation   Electrical Stimulation Location  rigth dorsal forearm targeting extensors    Electrical Stimulation Action  Russian, 10 minutes    Electrical Stimulation Parameters  40 mA CC    Electrical Stimulation Goals  Strength;Neuromuscular facilitation               OT Short Term Goals - 04/02/18 1337      OT SHORT TERM GOAL #1   Title  Patient will be educated and independent with HEP to faciliate progress in therapy and allow her to increase functional independence with ADLs.     Time  4    Period  Weeks    Status  On-going    Target Date  05/02/18      OT SHORT TERM GOAL #2   Title  Patient will become educated and independent with compensatory techniques and assistive devices that will  enable her to improve independence and safety during daily tasks.     Time  4    Period  Weeks    Status  On-going      OT SHORT TERM GOAL #3   Title  Patient will be educated on energy conservation techniques in order to be able to complete daily tasks with improved activity tolerance.     Time  4    Period  Weeks    Status  On-going      OT SHORT TERM GOAL #4   Title  Patient will decrease right shoulder pain to 6/10 or lower for improved quality of life.    Time  4    Period  Weeks    Status  On-going      OT SHORT TERM GOAL #5   Title  Patient will become educated on adaptive strategies to incorportate RUE to assist with bilateral functional tasks.     Time  4    Period  Weeks    Status  On-going        OT Long Term Goals - 04/02/18 1332      OT LONG TERM GOAL #1   Title  Patient will return to her highest level of independence while being able to utilize her RUE as an active assist for 50% or more of daily tasks.      Time  8    Period  Weeks    Status  New    Target Date  06/01/18      OT LONG TERM GOAL #2   Title  Patient will increase her motor coordination by completing a standardize test in order to use her right hand for feeding and grooming tasks.      Time  8    Period  Weeks    Status  New      OT LONG TERM GOAL #3   Title  Patient will increase her RUE strength to 3/5 in order to complete waist level functional daily tasks.    Time  8    Period  Weeks    Status  New      OT LONG  TERM GOAL #4   Title  Patient will increase her right hand grip strength to 10# and pinch strength to 4# in order to hold onto eating utensils without dropping.     Time  8    Period  Weeks    Status  New      OT LONG TERM GOAL #5   Title  Patient will increase A/ROM of RUE to [facility] in order to complete dressing tasks with supervision as needed.      Time  8    Period  Weeks    Status  New            Plan - 04/04/18 1146    Clinical Impression Statement  A:  Trialed powder board today for improved active muscle facilitation of right shoulder. Pt able to achieve shoulder flexion and protraction/retraction with max effort, max assist required from OT for shoulder extension. Pt primarily limited by severe pain in right upper arm, does not think ES helped much last session. Moist heat placed on shoulder during ES work on wrist extensors today. Pt continues to have active wrist extension while in supine with mod effort, reports was unable to complete at home, however was able to complete during session today. Pt returning to PCP today, instructed pt to inquire about botox or if she should call her neurologist to inquire before her appt in approximately 1 month due to the pain in her arm limiting participation. Verbal and tactile cuing for exercises today.      Plan  P: Continue with powder board working on protraction/retraction and flexion. Also resume closed chain exercises       Patient will benefit from skilled therapeutic intervention in order to improve the following deficits and impairments:  Decreased activity tolerance, Decreased knowledge of use of DME, Decreased strength, Impaired flexibility, Decreased mobility, Decreased range of motion, Pain, Decreased coordination, Increased fascial restrictions, Impaired UE functional use, Impaired tone  Visit Diagnosis: Other symptoms and signs involving the musculoskeletal system  Other lack of coordination  Hemiplegia and hemiparesis following cerebral infarction affecting right dominant side (HCC)  Acute pain of right shoulder    Problem List Patient Active Problem List   Diagnosis Date Noted  . Hypothyroidism 11/29/2017  . Cerebrovascular accident (CVA) (HCC)   . Ischemic stroke ([facility])   . Type 2 diabetes mellitus with hyperglycemia (HCC) 11/25/2017  . HLD (hyperlipidemia) 11/25/2017  . Fibromyalgia 11/25/2017  . Depression 11/25/2017  . Tobacco abuse 11/25/2017  . Papule 03/22/2015  .  Hypertension   . Acute diverticulitis 11/08/2014  . Diabetes (HCC) 11/08/2014  . Renal insufficiency 11/08/2014  . Abdominal pain 11/08/2014  . Left leg weakness 04/10/2011  . Abnormal gait 04/10/2011  . S/P total knee replacement 12/28/2010  . Knee pain 12/28/2010   Ezra Sites, OTR/L  [phone] 04/04/2018, 11:51 AM  [name] [address] [place], [place], [address] Phone: [phone]   Fax:  [phone]  Name: [name] MRN: [id number] Date of Birth: 1955/05/24

## 2018-04-10 ENCOUNTER — Ambulatory Visit (HOSPITAL_COMMUNITY): Payer: Medicare HMO

## 2018-04-10 ENCOUNTER — Ambulatory Visit (HOSPITAL_COMMUNITY): Payer: Medicare HMO | Admitting: Occupational Therapy

## 2018-04-10 ENCOUNTER — Encounter (HOSPITAL_COMMUNITY): Payer: Self-pay

## 2018-04-10 ENCOUNTER — Telehealth (HOSPITAL_COMMUNITY): Payer: Self-pay | Admitting: Occupational Therapy

## 2018-04-10 ENCOUNTER — Encounter (HOSPITAL_COMMUNITY): Payer: Self-pay | Admitting: Occupational Therapy

## 2018-04-10 DIAGNOSIS — R2681 Unsteadiness on feet: Secondary | ICD-10-CM

## 2018-04-10 DIAGNOSIS — R262 Difficulty in walking, not elsewhere classified: Secondary | ICD-10-CM

## 2018-04-10 DIAGNOSIS — M25511 Pain in right shoulder: Secondary | ICD-10-CM

## 2018-04-10 DIAGNOSIS — R29898 Other symptoms and signs involving the musculoskeletal system: Secondary | ICD-10-CM | POA: Diagnosis not present

## 2018-04-10 DIAGNOSIS — I69351 Hemiplegia and hemiparesis following cerebral infarction affecting right dominant side: Secondary | ICD-10-CM

## 2018-04-10 DIAGNOSIS — R278 Other lack of coordination: Secondary | ICD-10-CM | POA: Diagnosis not present

## 2018-04-10 DIAGNOSIS — M6281 Muscle weakness (generalized): Secondary | ICD-10-CM

## 2018-04-10 NOTE — Therapy (Signed)
[facility] [address] [place], [place], [address] Phone: [phone]   Fax:  [phone]  Occupational Therapy Treatment  Patient Details  Name: [name] MRN: [id number] Date of Birth: 1956-03-04 Referring Provider (OT): Nita Sells, MD   Encounter Date: 04/10/2018  OT End of Session - 04/10/18 1702    Visit Number  10    Number of Visits  24    Date for OT Re-Evaluation  06/01/18   mini-reassessment 05/02/2018   Authorization Type  1) [employer] Medicare HMO; $40 copay    OT Start Time  1347    OT Stop Time  1428    OT Time Calculation (min)  41 min    Activity Tolerance  Patient tolerated treatment well    Behavior During Therapy  [facility] for tasks assessed/performed       Past Medical History:  Diagnosis Date  . Diabetes mellitus without complication (HCC)   . Diverticulitis   . Fibromyalgia   . Hyperlipidemia   . Hypertension   . Papule 03/22/2015  . Stroke [facility])     Past Surgical History:  Procedure Laterality Date  . CHOLECYSTECTOMY    . KNEE SURGERY Left     There were no vitals filed for this visit.  Subjective Assessment - 04/10/18 1337    Subjective   S: The doctor said he thinks I'm a good canditdate for botox.     Currently in Pain?  Yes    Pain Score  7     Pain Location  Shoulder    Pain Orientation  Right    Pain Descriptors / Indicators  Aching;Sore    Pain Type  Acute pain    Pain Radiating Towards  shoulder    Pain Onset  More than a month ago    Pain Frequency  Constant    Aggravating Factors   moving it    Pain Relieving Factors  heat, rest    Effect of Pain on Daily Activities  max effect on ADL participation    Multiple Pain Sites  No         [facility] OT Assessment - 04/10/18 1337      Assessment   Medical Diagnosis  Right hemiparesis s/p CVA      Precautions   Precautions  Fall               OT Treatments/Exercises (OP) - 04/10/18 1656      Exercises   Exercises   Shoulder      Shoulder Exercises: Stretch   Elbow Flexion  PROM;5 reps;AROM;10 reps   OT providing min/mod assist above 90 degrees     Neurological Re-education Exercises   Scapular Stabilization  Right;10 reps    Other Information  Min facilitation for shoulder elevation and protraction/retraction to complete full motion    Shoulder Flexion  PROM;10 reps    Shoulder ABduction  PROM;10 reps    Shoulder Protraction  PROM;10 reps    Elbow Extension  PROM;5 reps    Wrist Flexion  PROM;5 reps;AROM;10 reps   A/ROM while in supine, arm in full extension by side   Wrist Extension  PROM;AROM;10 reps   A/ROM while supine, arm in full extension   Other Exercises 1  Powder board: pt in sidelying on left side, right arm positioned on powder board. Pt able to complete 5 reps flexion, 5 reps protraction/retraction with max effort. OT positioned at pt's back to prevent use of trunk  versus arm. Pt requiring max assist from OT for shoulder extension after bringing arm through flexion.     Other Weight-Bearing Exercises 1  Attempted to completed weightbearing in quadruped-pt unable due to pain in RUE at bicep region. Pt able to push up onto forearm for approximately 30" before needing to return to supine due to pain.     Development of Reach  Closed chain    Closed Chain Exercises  in sitting, pt sliding cone across table attempting to reach into protraction/flexion. As pt became fatigued reaching more into horizontal abduction versus protraction or flexion, OT providing min guidance to prevent excessive horiziontal abduction. Completed 5x. While seated, OT placed pt's elbow in flexion, then had pt push into extension, 10X      Manual Therapy   Manual Therapy  Myofascial release    Manual therapy comments  completed separately from therapeutic exercises     Myofascial Release  myofascial release completed to right upper arm from bicep to deltoid, and trapezius region to decrease pain and fascial restrictions  and improve tolerance to exercises.                OT Short Term Goals - 04/02/18 1337      OT SHORT TERM GOAL #1   Title  Patient will be educated and independent with HEP to faciliate progress in therapy and allow her to increase functional independence with ADLs.     Time  4    Period  Weeks    Status  On-going    Target Date  05/02/18      OT SHORT TERM GOAL #2   Title  Patient will become educated and independent with compensatory techniques and assistive devices that will enable her to improve independence and safety during daily tasks.     Time  4    Period  Weeks    Status  On-going      OT SHORT TERM GOAL #3   Title  Patient will be educated on energy conservation techniques in order to be able to complete daily tasks with improved activity tolerance.     Time  4    Period  Weeks    Status  On-going      OT SHORT TERM GOAL #4   Title  Patient will decrease right shoulder pain to 6/10 or lower for improved quality of life.    Time  4    Period  Weeks    Status  On-going      OT SHORT TERM GOAL #5   Title  Patient will become educated on adaptive strategies to incorportate RUE to assist with bilateral functional tasks.     Time  4    Period  Weeks    Status  On-going        OT Long Term Goals - 04/02/18 1332      OT LONG TERM GOAL #1   Title  Patient will return to her highest level of independence while being able to utilize her RUE as an active assist for 50% or more of daily tasks.      Time  8    Period  Weeks    Status  New    Target Date  06/01/18      OT LONG TERM GOAL #2   Title  Patient will increase her motor coordination by completing a standardize test in order to use her right hand for feeding and grooming tasks.  Time  8    Period  Weeks    Status  New      OT LONG TERM GOAL #3   Title  Patient will increase her RUE strength to 3/5 in order to complete waist level functional daily tasks.    Time  8    Period  Weeks    Status   New      OT LONG TERM GOAL #4   Title  Patient will increase her right hand grip strength to 10# and pinch strength to 4# in order to hold onto eating utensils without dropping.     Time  8    Period  Weeks    Status  New      OT LONG TERM GOAL #5   Title  Patient will increase A/ROM of RUE to [facility] in order to complete dressing tasks with supervision as needed.      Time  8    Period  Weeks    Status  New            Plan - 04/10/18 1702    Clinical Impression Statement  A: Pt reporting continued pain in right upper arm region today, reports PCP believes she is a good candidate for botox and is supposed to call neurologist to attempt to obtain an earlier appointment for the pt (currently scheduled for 05/21/18). Pt is limited in participation due to pain in right upper arm, myofascial release completed this session to decrease pain and improve tolerance to exercises. Continued with ROM exercises and powder board work today, continued with wrist exercises and closed chain work as well. Verbal cuing and tactile assist for correct form and technique.     Plan  P: Call PCP to find out if spoke with neurologist. Continue with closed chain exercises and ROM work       Patient will benefit from skilled therapeutic intervention in order to improve the following deficits and impairments:  Decreased activity tolerance, Decreased knowledge of use of DME, Decreased strength, Impaired flexibility, Decreased mobility, Decreased range of motion, Pain, Decreased coordination, Increased fascial restrictions, Impaired UE functional use, Impaired tone  Visit Diagnosis: Other symptoms and signs involving the musculoskeletal system  Other lack of coordination  Hemiplegia and hemiparesis following cerebral infarction affecting right dominant side (HCC)  Acute pain of right shoulder    Problem List Patient Active Problem List   Diagnosis Date Noted  . Hypothyroidism 11/29/2017  . Cerebrovascular  accident (CVA) (HCC)   . Ischemic stroke (HCC)   . Type 2 diabetes mellitus with hyperglycemia (HCC) 11/25/2017  . HLD (hyperlipidemia) 11/25/2017  . Fibromyalgia 11/25/2017  . Depression 11/25/2017  . Tobacco abuse 11/25/2017  . Papule 03/22/2015  . Hypertension   . Acute diverticulitis 11/08/2014  . Diabetes (HCC) 11/08/2014  . Renal insufficiency 11/08/2014  . Abdominal pain 11/08/2014  . Left leg weakness 04/10/2011  . Abnormal gait 04/10/2011  . S/P total knee replacement 12/28/2010  . Knee pain 12/28/2010   Ezra SitesLeslie Troxler, OTR/L  [phone] 04/10/2018, 5:08 PM  [facility] [address] , [place], [address] Phone: [phone]   Fax:  [phone]  Name: Forbes CellarVirginia T Rozar MRN: [id number] Date of Birth: 02/16/1956

## 2018-04-10 NOTE — Telephone Encounter (Signed)
Patient cancel Fridays appt, she didnot give a reason why.

## 2018-04-10 NOTE — Therapy (Addendum)
[place], [place], [address] Phone: [phone]   Fax:  [phone]   Progress Note Reporting Period 03/12/18 to 04/10/18  See note below for Objective Data and Assessment of Progress/Goals.   Pt has overall made great progress but is still limited in her functional strength, mobility, balance, and gait as illustrated below and needs continued skilled PT intervention to further address these impairments to maximize her QOL and independence at home.  Geraldine Solar PT, DPT  Physical Therapy Treatment  Patient Details  Name: [name] MRN: [id number] Date of Birth: 1955-10-10 Referring Provider (PT): Celene Squibb, MD   Encounter Date: 04/10/2018  PT End of Session - 04/10/18 1517    Visit Number  14    Number of Visits  29    Date for PT Re-Evaluation  05/23/18   reassessment completed visit #14, 04/10/18   Authorization Type  [employer]na Medicare HMO    Authorization Time Period  02/12/18 to 04/09/18; NEW: 04/10/18 to 05/23/18    Authorization - Visit Number  14    Authorization - Number of Visits  29    PT Start Time  0092    PT Stop Time  1512    PT Time Calculation (min)  38 min    Equipment Utilized During Treatment  Gait belt    Activity Tolerance  Patient limited by fatigue;Patient limited by pain;Patient tolerated treatment well   Rt UE pain   Behavior During Therapy  [facility] for tasks assessed/performed       Past Medical History:  Diagnosis Date  . Diabetes mellitus without complication ([place])   . Diverticulitis   . Fibromyalgia   . Hyperlipidemia   . Hypertension   . Papule 03/22/2015  . Stroke [facility])     Past Surgical History:  Procedure Laterality Date  . CHOLECYSTECTOMY    . KNEE SURGERY Left     There were no vitals filed for this visit.  Subjective Assessment - 04/10/18 1431    Subjective  Pt stated she continues to have high level of pain, 10/10 Rt shoulder    Limitations   Walking;House hold activities    Patient Stated Goals  be able to walk better    Currently in Pain?  Yes    Pain Score  10-Worst pain ever    Pain Location  Shoulder    Pain Orientation  Right    Pain Descriptors / Indicators  Tightness    Pain Type  Acute pain    Pain Onset  More than a month ago    Pain Frequency  Constant    Aggravating Factors   moving it    Pain Relieving Factors  heat, rest    Effect of Pain on Daily Activities  max effect on ADL completion         [facility] PT Assessment - 04/10/18 1407      Assessment   Medical Diagnosis  Right hemiparesis s/p CVA    Referring Provider (PT)  Celene Squibb, MD    Onset Date/Surgical Date  11/25/17    Hand Dominance  Right    Prior [place] (d/c on 02/07/18)      Precautions   Precautions  Fall      Observation/Other Assessments   Focus on Therapeutic Outcomes (FOTO)   04/10/18: 44.50%; limited 55%   eval: 45.63%, limited 54% (completed with PT 02/14/18     Strength  Right Hip Flexion  4-/5   was 4/5   Right Hip Extension  2+/5   12/11: done in seated due to pain prone/sidelying 2+/5   Right Hip ABduction  4/5   was 4/5   Left Hip Flexion  4+/5   was 4+/5   Left Hip Extension  3+/5   12/11:done in seated due to Rt shoulder pain  was 3/5   Left Hip ABduction  4+/5   was 4+/5   Right Knee Flexion  4/5   was 4-/5   Right Knee Extension  4+/5   was 4+/5   Left Knee Flexion  4+/5   was 4+/5   Left Knee Extension  4+/5   was 4+/5   Right Ankle Dorsiflexion  3/5   was 3/5   Left Ankle Dorsiflexion  --   was 4+/5     Ambulation/Gait   Ambulation Distance (Feet)  200 Feet   3MWT with hemi walker.  was 130 ft with 3MWT   Gait Comments  Pt fatigued with 3MWT, multiple toe drags though pt able regain her LOB independently with hemi      Standardized Balance Assessment   Standardized Balance Assessment  Berg Balance Test    Five times sit to stand comments   16.05" from standard chair,  required 1 UE support to stand   eval: 23.6sec, from standard chair, 1 UE support;      Berg Balance Test   Sit to Stand  Able to stand  independently using hands   was 2   Standing Unsupported  Able to stand 2 minutes with supervision   was 3   Sitting with Back Unsupported but Feet Supported on Floor or Stool  Able to sit safely and securely 2 minutes    Stand to Sit  Sits safely with minimal use of hands   was 3   Transfers  Able to transfer safely, definite need of hands   was 3   Standing Unsupported with Eyes Closed  Able to stand 10 seconds safely   was 3   Standing Ubsupported with Feet Together  Able to place feet together independently and stand 1 minute safely   was 3   From Standing, Reach Forward with Outstretched Arm  Can reach forward >12 cm safely (5")   was 3   From Standing Position, Pick up Object from Floor  Able to pick up shoe, needs supervision   was 3   From Standing Position, Turn to Look Behind Over each Shoulder  Looks behind one side only/other side shows less weight shift   was 1   Turn 360 Degrees  Able to turn 360 degrees safely but slowly   with hemi was 2   Standing Unsupported, Alternately Place Feet on Step/Stool  Able to complete >2 steps/needs minimal assist   able to complete 13" with hemi aidewas 0   Standing Unsupported, One Foot in Front  Needs help to step but can hold 15 seconds   was 1   Standing on One Leg  Tries to lift leg/unable to hold 3 seconds but remains standing independently   was 1   Total Score  39      Timed Up and Go Test   TUG  Normal TUG    Normal TUG (seconds)  35   was 50   TUG Comments  hemi walker              PT Short Term Goals -  04/10/18 1839      PT SHORT TERM GOAL #1   Title  Pt will be independent with HEP and perform consistently in order to maximize strength, balance, and independence at home.    Status  Achieved      PT SHORT TERM GOAL #2   Title  Pt will have improved MMT by 1/2 grade in  order to maximize transfers, gait, and balance.    Baseline  04/10/18: see MMT    Status  On-going      PT SHORT TERM GOAL #3   Title  Pt will have improved BERG balance score to 40/56 to demo improved balance and decreased risk for falls.    Baseline  04/10/18: 39/56 was 29/56    Status  On-going        PT Long Term Goals - 04/10/18 1839      PT LONG TERM GOAL #1   Title  Pt will have improved MMT by 1 grade throughout in order to further maximize functional mobility, balance, and decrease her risk for falls.    Baseline  04/10/18: see MMT    Status  On-going      PT LONG TERM GOAL #2   Title  Pt will have improved BERG balance score to 46/56 in order to further demo improved balance and decreased risk for falls.    Baseline  04/10/18: BERG 39/56      PT LONG TERM GOAL #3   Title  Pt will be able to perform the TUG in 45sec or better with LRAD to demo improved functional mobility and balance in order to maximize [facility] ambulation.     Baseline  04/10/2018: TUG 35" with hemiwalker and HHA for standing (was 50" eval)    Status  Achieved      PT LONG TERM GOAL #4   Title  Pt will have improved 5xSTS to 15sec or < without UE to demo improved functional strength and mobility.    Baseline  04/10/18: 5 STS in 16.05" with 1 HHA required    Status  On-going      PT LONG TERM GOAL #5   Title  Pt will have improved 3MWT by 135f or > with LRAD, mod I, and without evidence of R toe drag in order to demo improved functional mobility and maximize her community ambulation.     Baseline  04/10/18: 3MWT: 200 ft with hemi and noted toe drage per fatigue wiht task (was 130 ft eval)    Status  On-going            Plan - 04/10/18 1834    Clinical Impression Statement  Reviewed goals with the following findings: Pt reports independent with HEP and reports increased ambulation with hemiwalker at home.  Pt has progressed well wtih some activities though continues to demonstrate weakness and  decreased balance with increased risk of fall.  Pt wiht improvements in BERG, TUG and 3MWT.  Though she has improved wiht these areas, still has not fully met there goals.  Continues to have toe drag, awaiting signed order for AFO, need to follow up on received referral.      Rehab Potential  Good    PT Frequency  2x / week    PT Duration  6 weeks    PT Treatment/Interventions  ADLs/Self Care Home Management;Aquatic Therapy;Cryotherapy;Electrical Stimulation;Moist Heat;Ultrasound;DME Instruction;Gait training;Functional mobility tScientist, forensicTherapeutic activities;Therapeutic exercise;Balance training;Neuromuscular re-education;Patient/family education;Orthotic Fit/Training;Manual techniques;Wheelchair mobility training;Scar mobilization;Passive range of motion;Dry needling;Energy conservation;Taping  PT Next Visit Plan  contniue [place] to R peroneals; Continue BLE and functional strengthening, balance work, R HS and gastroc strengthening, and coordination of RLE; f/u on AFO    PT Home Exercise Plan  eval: bridging, supine march, supine clams RTB 10/22: adduction, LAQ, heel/toe raises; 11/5: seated heel slides, supine heel slides, standing knee flexion; 11/19: sidelying clams, seated ankle eversion       Patient will benefit from skilled therapeutic intervention in order to improve the following deficits and impairments:  Abnormal gait, Decreased activity tolerance, Decreased balance, Decreased coordination, Decreased endurance, Decreased mobility, Decreased range of motion, Decreased strength, Difficulty walking, Increased muscle spasms, Impaired flexibility, Impaired UE functional use  Visit Diagnosis: Muscle weakness (generalized)  Difficulty in walking, not elsewhere classified  Unsteadiness on feet     Problem List Patient Active Problem List   Diagnosis Date Noted  . Hypothyroidism 11/29/2017  . Cerebrovascular accident (CVA) ([place])   . Ischemic stroke ([place])   . Type 2  diabetes mellitus with hyperglycemia ([place]) 11/25/2017  . HLD (hyperlipidemia) 11/25/2017  . Fibromyalgia 11/25/2017  . Depression 11/25/2017  . Tobacco abuse 11/25/2017  . Papule 03/22/2015  . Hypertension   . Acute diverticulitis 11/08/2014  . Diabetes ([place]) 11/08/2014  . Renal insufficiency 11/08/2014  . Abdominal pain 11/08/2014  . Left leg weakness 04/10/2011  . Abnormal gait 04/10/2011  . S/P total knee replacement 12/28/2010  . Knee pain 12/28/2010   Ihor Austin, LPTA; CBIS [phone]  Geralyn Corwin 04/11/2018, 8:13 AM  [place], [place], [address] Phone: [phone]   Fax:  [phone]  Name: [name] MRN: [id number] Date of Birth: June 04, 1955

## 2018-04-11 ENCOUNTER — Encounter (HOSPITAL_COMMUNITY): Payer: Self-pay | Admitting: Occupational Therapy

## 2018-04-11 NOTE — Addendum Note (Signed)
Addended by: Jerl SantosPOWELL, Krysta Bloomfield E on: 04/11/2018 08:17 AM   Modules accepted: Orders

## 2018-04-11 NOTE — Therapy (Signed)
[facility] [address] [place], [place], [address] Phone: [phone]   Fax:  [phone]  Patient Details  Name: [name] MRN: [id number] Date of Birth: 05/14/1955 Referring Provider:  No ref. provider found  Encounter Date: 04/11/2018   Called and spoke to Old AgencyAshley at Dr. Scharlene GlossHall's office who reports [facility] and [facility] called pt on Tuesday to schedule but did not get an answer. OT called pt and spoke to husband, [facility] and asked to call and schedule.   Ezra SitesLeslie Troxler, OTR/L  [phone] 04/11/2018, 3:33 PM  [facility] [address] [place], [place], [address] Phone: [phone]   Fax:  [phone]

## 2018-04-12 ENCOUNTER — Ambulatory Visit (HOSPITAL_COMMUNITY): Payer: Medicare HMO

## 2018-04-12 ENCOUNTER — Encounter (HOSPITAL_COMMUNITY): Payer: Medicare HMO | Admitting: Occupational Therapy

## 2018-04-13 DIAGNOSIS — R69 Illness, unspecified: Secondary | ICD-10-CM | POA: Diagnosis not present

## 2018-04-15 ENCOUNTER — Telehealth: Payer: Self-pay | Admitting: Neurology

## 2018-04-15 NOTE — Telephone Encounter (Signed)
Pt requesting a call stating that she hasn't heard anything about receiving her botox injections?

## 2018-04-16 ENCOUNTER — Ambulatory Visit (HOSPITAL_COMMUNITY): Payer: Medicare HMO | Admitting: Physical Therapy

## 2018-04-16 ENCOUNTER — Ambulatory Visit (HOSPITAL_COMMUNITY): Payer: Medicare HMO | Admitting: Occupational Therapy

## 2018-04-16 ENCOUNTER — Encounter (HOSPITAL_COMMUNITY): Payer: Self-pay | Admitting: Occupational Therapy

## 2018-04-16 DIAGNOSIS — M25511 Pain in right shoulder: Secondary | ICD-10-CM | POA: Diagnosis not present

## 2018-04-16 DIAGNOSIS — I69351 Hemiplegia and hemiparesis following cerebral infarction affecting right dominant side: Secondary | ICD-10-CM | POA: Diagnosis not present

## 2018-04-16 DIAGNOSIS — R278 Other lack of coordination: Secondary | ICD-10-CM

## 2018-04-16 DIAGNOSIS — R29898 Other symptoms and signs involving the musculoskeletal system: Secondary | ICD-10-CM

## 2018-04-16 DIAGNOSIS — M6281 Muscle weakness (generalized): Secondary | ICD-10-CM | POA: Diagnosis not present

## 2018-04-16 DIAGNOSIS — R262 Difficulty in walking, not elsewhere classified: Secondary | ICD-10-CM | POA: Diagnosis not present

## 2018-04-16 DIAGNOSIS — R2681 Unsteadiness on feet: Secondary | ICD-10-CM | POA: Diagnosis not present

## 2018-04-16 NOTE — Telephone Encounter (Signed)
Noted, thank you

## 2018-04-16 NOTE — Telephone Encounter (Signed)
I did not specifically discussed Botox for spasticity management in my visit with her but she has an upcoming visit next month with my nurse practitioner and she could discuss this and consider Botox

## 2018-04-16 NOTE — Telephone Encounter (Signed)
RN call patient that Dr. Pearlean BrownieSethi did not recommend any botox injections. Rn stated he only recommend possibly research trial. Pt verbalized understanding.

## 2018-04-16 NOTE — Telephone Encounter (Signed)
LEft vm for Michele CowerLesley to call back about possibly pt stating botox injections. Per Dr. Pearlean BrownieSethi he did not discuss at last visit with pt. Pt has appt with [name] in January 2020 where she can be evaluate.

## 2018-04-16 NOTE — Telephone Encounter (Signed)
Revised. 

## 2018-04-16 NOTE — Therapy (Signed)
[facility] [address], [place], [address] Phone: [phone]   Fax:  [phone]  Occupational Therapy Treatment  Patient Details  Name: Forbes CellarVirginia T Wierzbicki MRN: [id number] Date of Birth: 02/28/1956 Referring Provider (OT): Nita SellsJohn Hall, MD   Encounter Date: 04/16/2018  OT End of Session - 04/16/18 1445    Visit Number  11    Number of Visits  24    Date for OT Re-Evaluation  06/01/18   mini-reassessment 05/02/2018   Authorization Type  1) [employer] Medicare HMO; $40 copay    OT Start Time  1353   pt arrived at [id number] but was in restroom   OT Stop Time  1430    OT Time Calculation (min)  37 min    Activity Tolerance  Patient tolerated treatment well    Behavior During Therapy  [facility] assessed/performed       Past Medical History:  Diagnosis Date  . Diabetes mellitus without complication (HCC)   . Diverticulitis   . Fibromyalgia   . Hyperlipidemia   . Hypertension   . Papule 03/22/2015  . Stroke [facility](HCC)     Past Surgical History:  Procedure Laterality Date  . CHOLECYSTECTOMY    . KNEE SURGERY Left     There were no vitals filed for this visit.  Subjective Assessment - 04/16/18 1359    Subjective   S: My arm was feeling a little better but it's back to hurting today.     Currently in Pain?  Yes    Pain Score  6     Pain Location  Shoulder    Pain Orientation  Right    Pain Descriptors / Indicators  Aching;Sore;Tightness    Pain Type  Acute pain    Pain Radiating Towards  shoulder     Pain Onset  More than a month ago    Pain Frequency  Constant    Aggravating Factors   moving it    Pain Relieving Factors  heat, rest    Effect of Pain on Daily Activities  max effect on ADL completion    Multiple Pain Sites  No                   OT Treatments/Exercises (OP) - 04/16/18 1435      Exercises   Exercises  Shoulder      Shoulder Exercises: Stretch   Elbow Flexion  PROM;5 reps;AROM;10 reps   OT  providing mod assist for >90 degrees     Neurological Re-education Exercises   Shoulder Flexion  PROM;10 reps;Self ROM;5 reps    Shoulder ABduction  PROM;10 reps;Self ROM;5 reps    Shoulder Protraction  PROM;10 reps    Elbow Extension  PROM;AROM;5 reps    Wrist Flexion  PROM;5 reps    Wrist Extension  PROM;AROM;5 reps    Finger Flexion  Pt squeezed OT's fingers 5x working on gross grasp    Finger Extension  Pt unable to actively extend digits, therefore once pt squeezed OT's fingers pt relaxed digits and OT provided max assist to extend    Weight Bearing Position  Seated    Seated with weight on hand  Pt sat with RUE abducted slightly and hand placed at edge of mat table for weightbearing. OT providing stabilization at hand and elbow to prevent buckling. 1\' 30"  hold, 3X    Development of Reach  Closed chain    Closed Chain Exercises  In sitting, pt  pushing OT"s hand in various directions-protraction, horizontal abduction, elbow extension. Pt completed 5x each direction with max effort, unable to push above chest height due to pain in right upper arm      Manual Therapy   Manual Therapy  Myofascial release    Manual therapy comments  completed separately from therapeutic exercises     Myofascial Release  myofascial release completed to right upper arm from bicep to deltoid, and trapezius region to decrease pain and fascial restrictions and improve tolerance to exercises.                OT Short Term Goals - 04/02/18 1337      OT SHORT TERM GOAL #1   Title  Patient will be educated and independent with HEP to faciliate progress in therapy and allow her to increase functional independence with ADLs.     Time  4    Period  Weeks    Status  On-going    Target Date  05/02/18      OT SHORT TERM GOAL #2   Title  Patient will become educated and independent with compensatory techniques and assistive devices that will enable her to improve independence and safety during daily tasks.      Time  4    Period  Weeks    Status  On-going      OT SHORT TERM GOAL #3   Title  Patient will be educated on energy conservation techniques in order to be able to complete daily tasks with improved activity tolerance.     Time  4    Period  Weeks    Status  On-going      OT SHORT TERM GOAL #4   Title  Patient will decrease right shoulder pain to 6/10 or lower for improved quality of life.    Time  4    Period  Weeks    Status  On-going      OT SHORT TERM GOAL #5   Title  Patient will become educated on adaptive strategies to incorportate RUE to assist with bilateral functional tasks.     Time  4    Period  Weeks    Status  On-going        OT Long Term Goals - 04/02/18 1332      OT LONG TERM GOAL #1   Title  Patient will return to her highest level of independence while being able to utilize her RUE as an active assist for 50% or more of daily tasks.      Time  8    Period  Weeks    Status  New    Target Date  06/01/18      OT LONG TERM GOAL #2   Title  Patient will increase her motor coordination by completing a standardize test in order to use her right hand for feeding and grooming tasks.      Time  8    Period  Weeks    Status  New      OT LONG TERM GOAL #3   Title  Patient will increase her RUE strength to 3/5 in order to complete waist level functional daily tasks.    Time  8    Period  Weeks    Status  New      OT LONG TERM GOAL #4   Title  Patient will increase her right hand grip strength to 10# and pinch strength to 4# in order  to hold onto eating utensils without dropping.     Time  8    Period  Weeks    Status  New      OT LONG TERM GOAL #5   Title  Patient will increase A/ROM of RUE to [facility] in order to complete dressing tasks with supervision as needed.      Time  8    Period  Weeks    Status  New            Plan - 04/16/18 1445    Clinical Impression Statement  A: Pt reporting continued pain in right upper arm limiting ability to  complete HEP. Manual techniques completed to reduce fascial restrictions and pain, OT notes slight decrease in size of muscle knot and tightness in upper arm/bicep region today. Pt has the capacity to make additional progress with functional use and activation of RUE, however continues to be severely limiting in ability to perform exercises due to pain and increased tone in right upper arm. Discussed with pt and pt reports she called the doctor but isn't sure if her doctor can do botox. OT will call to clarify request with MD. Pt is agreeable to potentially hold therapy until after she sees MD regarding potential botox treatment or other pain management.     Plan  P: Call neurologist and clarify request for botox eligibility. Decide if holding therapy or continuing is in pt's best interest.        Patient will benefit from skilled therapeutic intervention in order to improve the following deficits and impairments:  Decreased activity tolerance, Decreased knowledge of use of DME, Decreased strength, Impaired flexibility, Decreased mobility, Decreased range of motion, Pain, Decreased coordination, Increased fascial restrictions, Impaired UE functional use, Impaired tone  Visit Diagnosis: Other symptoms and signs involving the musculoskeletal system  Other lack of coordination  Hemiplegia and hemiparesis following cerebral infarction affecting right dominant side (HCC)  Acute pain of right shoulder    Problem List Patient Active Problem List   Diagnosis Date Noted  . Hypothyroidism 11/29/2017  . Cerebrovascular accident (CVA) (HCC)   . Ischemic stroke (HCC)   . Type 2 diabetes mellitus with hyperglycemia (HCC) 11/25/2017  . HLD (hyperlipidemia) 11/25/2017  . Fibromyalgia 11/25/2017  . Depression 11/25/2017  . Tobacco abuse 11/25/2017  . Papule 03/22/2015  . Hypertension   . Acute diverticulitis 11/08/2014  . Diabetes (HCC) 11/08/2014  . Renal insufficiency 11/08/2014  . Abdominal pain  11/08/2014  . Left leg weakness 04/10/2011  . Abnormal gait 04/10/2011  . S/P total knee replacement 12/28/2010  . Knee pain 12/28/2010   Ezra Sites, OTR/L  [phone] 04/16/2018, 2:51 PM  [facility] [address] [place], [place], [address] Phone: [phone]   Fax:  [phone]  Name: KELSEA MOUSEL MRN: [id number] Date of Birth: 28-Jul-1955

## 2018-04-16 NOTE — Telephone Encounter (Addendum)
Rn spoke with Cathlean CowerLesley at [facility]. Cathlean CowerLesley stated she wanted to know if pt would be a good candidate for botox. Rn stated per Dr.Sethi it was not discuss at last visit in 01/2018. Cathlean CowerLesley stated pt arm is getting tighter at times and the PCP sent over another referral. She stated pt had appt with [name] and Dr. Pearlean BrownieSethi the same month. Rn stated the appt with [name] will be cancel. The appt with Dr Pearlean BrownieSEthi will stay for evaluation of botox that was schedule [facility] [name] will tell pt.

## 2018-04-16 NOTE — Telephone Encounter (Signed)
Katrina, I dont see anything in this note about Botox? Please advise.

## 2018-04-16 NOTE — Therapy (Signed)
[facility] [address] [place], [place], [address] Phone: [phone]   Fax:  [phone]  Physical Therapy Treatment  Patient Details  Name: [name] MRN: [id number] Date of Birth: [birth year] Referring Provider (PT): Benita Stabile, MD   Encounter Date: 04/16/2018  PT End of Session - 04/16/18 1502    Visit Number  15    Number of Visits  29    Date for PT Re-Evaluation  05/23/18   reassessment completed visit #14, 04/10/18   Authorization Type  [employer] Medicare HMO    Authorization Time Period  02/12/18 to 04/09/18; NEW: 04/10/18 to 05/23/18    Authorization - Visit Number  15    Authorization - Number of Visits  29    PT Start Time  1435    PT Stop Time  1515    PT Time Calculation (min)  40 min    Equipment Utilized During Treatment  Gait belt    Activity Tolerance  Patient limited by fatigue;Patient limited by pain;Patient tolerated treatment well   Rt UE pain   Behavior During Therapy  [facility] for tasks assessed/performed       Past Medical History:  Diagnosis Date  . Diabetes mellitus without complication (HCC)   . Diverticulitis   . Fibromyalgia   . Hyperlipidemia   . Hypertension   . Papule 03/22/2015  . Stroke [facility])     Past Surgical History:  Procedure Laterality Date  . CHOLECYSTECTOMY    . KNEE SURGERY Left     There were no vitals filed for this visit.                    [facility] Adult PT Treatment/Exercise - 04/16/18 1457      Ambulation/Gait   Ambulation/Gait  Yes    Ambulation Distance (Feet)  226 Feet    Assistive device  Hemi-walker    Gait Pattern  Step-to pattern    Ambulation Surface  Level    Gait Comments  hyperextension of Rt knee, fatiquue towards end of ambulation      Modalities   Modalities  Electrical Stimulation;Moist Heat      Electrical Stimulation   Electrical Stimulation Location  Rt peroneal, AT    Electrical Stimulation Action  Russian 10 minutes    Electrical Stimulation Parameters  58mA CC 10/30 cycle    Electrical Stimulation Goals  Strength;Neuromuscular facilitation          Balance Exercises - 04/16/18 1510      Balance Exercises: Standing   Partial Tandem Stance  Eyes closed;Foam/compliant surface;Intermittent upper extremity support;5 reps;20 secs    Marching Limitations  x10 reps foam no UE, manual assistance to prevent R knee snapping back/hyperextension          PT Short Term Goals - 04/10/18 1839      PT SHORT TERM GOAL #1   Title  Pt will be independent with HEP and perform consistently in order to maximize strength, balance, and independence at home.    Status  Achieved      PT SHORT TERM GOAL #2   Title  Pt will have improved MMT by 1/2 grade in order to maximize transfers, gait, and balance.    Baseline  04/10/18: see MMT    Status  On-going      PT SHORT TERM GOAL #3   Title  Pt will have improved BERG balance score to 40/56 to demo improved balance and decreased  risk for falls.    Baseline  04/10/18: 39/56 was 29/56    Status  On-going        PT Long Term Goals - 04/10/18 1839      PT LONG TERM GOAL #1   Title  Pt will have improved MMT by 1 grade throughout in order to further maximize functional mobility, balance, and decrease her risk for falls.    Baseline  04/10/18: see MMT    Status  On-going      PT LONG TERM GOAL #2   Title  Pt will have improved BERG balance score to 46/56 in order to further demo improved balance and decreased risk for falls.    Baseline  04/10/18: BERG 39/56      PT LONG TERM GOAL #3   Title  Pt will be able to perform the TUG in 45sec or better with LRAD to demo improved functional mobility and balance in order to maximize [facility] ambulation.     Baseline  04/10/2018: TUG 35" with hemiwalker and HHA for standing (was 50" eval)    Status  Achieved      PT LONG TERM GOAL #4   Title  Pt will have improved 5xSTS to 15sec or < without UE to demo improved functional  strength and mobility.    Baseline  04/10/18: 5 STS in 16.05" with 1 HHA required    Status  On-going      PT LONG TERM GOAL #5   Title  Pt will have improved by 169ft or > with LRAD, mod I, and without evidence of R toe drag in order to demo improved functional mobility and maximize her community ambulation.     Baseline  04/10/18: : 200 ft with hemi and noted toe drage per fatigue wiht task (was 130 ft eval)    Status  On-going            Plan - 04/16/18 1502    Clinical Impression Statement  PT to get her AFO soon, sending payment and hopes to get in next couple weeks.  Continued to work on weak mm and improve balance this session.  WArmed up with ambulation, still with severe hyperextension with ambulation and beginnings of toe drag after approx 200 feet when becomes fatigued.  continued work on Hospital doctor with inability to maintain on Rt LE without UE assist.  TAndem stance able to complete with increased diffiuclty without optic input and need to grasp after approx 15-20 seconds.  Good contraction obtained with russian stimulation to Rt peroneal/AT.      Rehab Potential  Good    PT Frequency  2x / week    PT Duration  6 weeks    PT Treatment/Interventions  ADLs/Self Care Home Management;Aquatic Therapy;Cryotherapy;Electrical Stimulation;Moist Heat;Ultrasound;DME Instruction;Gait training;Functional mobility Network engineer;Therapeutic activities;Therapeutic exercise;Balance training;Neuromuscular re-education;Patient/family education;Orthotic Fit/Training;Manual techniques;Wheelchair mobility training;Scar mobilization;Passive range of motion;Dry needling;Energy conservation;Taping    PT Next Visit Plan  contniue [place] to R peroneals; Continue BLE and functional strengthening, balance work, R HS and gastroc strengthening, and coordination of RLE; f/u on AFO    PT Home Exercise Plan  eval: bridging, supine march, supine clams RTB 10/22: adduction, LAQ, heel/toe  raises; 11/5: seated heel slides, supine heel slides, standing knee flexion; 11/19: sidelying clams, seated ankle eversion       Patient will benefit from skilled therapeutic intervention in order to improve the following deficits and impairments:  Abnormal gait, Decreased activity tolerance, Decreased balance, Decreased coordination, Decreased  endurance, Decreased mobility, Decreased range of motion, Decreased strength, Difficulty walking, Increased muscle spasms, Impaired flexibility, Impaired UE functional use  Visit Diagnosis: Other symptoms and signs involving the musculoskeletal system  Other lack of coordination     Problem List Patient Active Problem List   Diagnosis Date Noted  . Hypothyroidism 11/29/2017  . Cerebrovascular accident (CVA) (HCC)   . Ischemic stroke (HCC)   . Type 2 diabetes mellitus with hyperglycemia (HCC) 11/25/2017  . HLD (hyperlipidemia) 11/25/2017  . Fibromyalgia 11/25/2017  . Depression 11/25/2017  . Tobacco abuse 11/25/2017  . Papule 03/22/2015  . Hypertension   . Acute diverticulitis 11/08/2014  . Diabetes (HCC) 11/08/2014  . Renal insufficiency 11/08/2014  . Abdominal pain 11/08/2014  . Left leg weakness 04/10/2011  . Abnormal gait 04/10/2011  . S/P total knee replacement 12/28/2010  . Knee pain 12/28/2010   Lurena NidaAmy B Ariyan Brisendine, PTA/CLT [phone]  Lurena NidaFrazier, Saanvi Hakala B 04/16/2018, 3:27 PM  [facility] [address] [place], [place], [address] Phone: [phone]   Fax:  [phone]  Name: [name] MRN: [id number] Date of Birth: 12/25/1955

## 2018-04-16 NOTE — Telephone Encounter (Signed)
[name] w/ [facility] called stating the pt would like to discuss starting botox treatments. Wanting to know if Dr. Pearlean BrownieSethi thinks they would benefit her and her progress with right arm weakness. [name] can be reached at [phone] please advise.

## 2018-04-18 ENCOUNTER — Ambulatory Visit (HOSPITAL_COMMUNITY): Payer: Medicare HMO

## 2018-04-18 ENCOUNTER — Encounter (HOSPITAL_COMMUNITY): Payer: Medicare HMO | Admitting: Occupational Therapy

## 2018-04-18 ENCOUNTER — Telehealth (HOSPITAL_COMMUNITY): Payer: Self-pay

## 2018-04-18 DIAGNOSIS — J060 Acute laryngopharyngitis: Secondary | ICD-10-CM | POA: Diagnosis not present

## 2018-04-18 NOTE — Telephone Encounter (Addendum)
Pt called in sick today with a cold, she hopes she can return next Thursday. This patient does not have Medicaid- this part of the note was done in error. NF

## 2018-04-25 ENCOUNTER — Ambulatory Visit (HOSPITAL_COMMUNITY): Payer: Medicare HMO

## 2018-04-25 ENCOUNTER — Telehealth (HOSPITAL_COMMUNITY): Payer: Self-pay

## 2018-04-25 ENCOUNTER — Ambulatory Visit (HOSPITAL_COMMUNITY): Payer: Medicare HMO | Admitting: Occupational Therapy

## 2018-04-25 DIAGNOSIS — I10 Essential (primary) hypertension: Secondary | ICD-10-CM | POA: Diagnosis not present

## 2018-04-25 DIAGNOSIS — M21371 Foot drop, right foot: Secondary | ICD-10-CM | POA: Diagnosis not present

## 2018-04-25 DIAGNOSIS — I69351 Hemiplegia and hemiparesis following cerebral infarction affecting right dominant side: Secondary | ICD-10-CM | POA: Diagnosis not present

## 2018-04-25 NOTE — Telephone Encounter (Signed)
She have 2 another appts today and had to cancel.

## 2018-04-26 ENCOUNTER — Encounter (HOSPITAL_COMMUNITY): Payer: Medicare HMO | Admitting: Occupational Therapy

## 2018-04-26 ENCOUNTER — Encounter (HOSPITAL_COMMUNITY): Payer: Self-pay

## 2018-04-26 ENCOUNTER — Ambulatory Visit (HOSPITAL_COMMUNITY): Payer: Medicare HMO

## 2018-04-26 DIAGNOSIS — I69351 Hemiplegia and hemiparesis following cerebral infarction affecting right dominant side: Secondary | ICD-10-CM | POA: Diagnosis not present

## 2018-04-26 DIAGNOSIS — M6281 Muscle weakness (generalized): Secondary | ICD-10-CM | POA: Diagnosis not present

## 2018-04-26 DIAGNOSIS — M25511 Pain in right shoulder: Secondary | ICD-10-CM | POA: Diagnosis not present

## 2018-04-26 DIAGNOSIS — R262 Difficulty in walking, not elsewhere classified: Secondary | ICD-10-CM | POA: Diagnosis not present

## 2018-04-26 DIAGNOSIS — R278 Other lack of coordination: Secondary | ICD-10-CM | POA: Diagnosis not present

## 2018-04-26 DIAGNOSIS — R2681 Unsteadiness on feet: Secondary | ICD-10-CM | POA: Diagnosis not present

## 2018-04-26 DIAGNOSIS — R29898 Other symptoms and signs involving the musculoskeletal system: Secondary | ICD-10-CM | POA: Diagnosis not present

## 2018-04-26 NOTE — Therapy (Signed)
[facility] [address] [place], [place], [address] Phone: [phone]   Fax:  [phone]  Physical Therapy Treatment  Patient Details  Name: [name] MRN: [id number] Date of Birth: 03/20/1956 Referring Provider (PT): Benita StabileJohn Z Hall, MD   Encounter Date: 04/26/2018  PT End of Session - 04/26/18 1140    Visit Number  16    Number of Visits  29    Date for PT Re-Evaluation  05/23/18   Reassessment complete visit #14, 04/10/18   Authorization Type  [employer] HMO    Authorization Time Period  02/12/18 to 04/09/18; NEW: 04/10/18 to 05/23/18    Authorization - Visit Number  16    Authorization - Number of Visits  29    PT Start Time  1120    PT Stop Time  1159    PT Time Calculation (min)  39 min    Equipment Utilized During Treatment  Gait belt    Activity Tolerance  Patient limited by fatigue;Patient limited by pain;Patient tolerated treatment well   Rt UE pain   Behavior During Therapy  [facility] for tasks assessed/performed       Past Medical History:  Diagnosis Date  . Diabetes mellitus without complication (HCC)   . Diverticulitis   . Fibromyalgia   . Hyperlipidemia   . Hypertension   . Papule 03/22/2015  . Stroke [facility](HCC)     Past Surgical History:  Procedure Laterality Date  . CHOLECYSTECTOMY    . KNEE SURGERY Left     There were no vitals filed for this visit.  Subjective Assessment - 04/26/18 1138    Subjective  Pt arrived wearing new AFOs, purchased yesterday.  Rt shoulder pain 8/10, reports Botox shots in shoulders scheduled for 05/27/2018.    Patient Stated Goals  be able to walk better    Currently in Pain?  Yes    Pain Score  8     Pain Location  Shoulder    Pain Orientation  Right    Pain Descriptors / Indicators  Aching;Sore    Pain Type  Acute pain    Pain Onset  More than a month ago    Pain Frequency  Constant    Aggravating Factors   moving it    Pain Relieving Factors  heat, rest    Effect  of Pain on Daily Activities  max effect on ADL completion.                       [facility] Adult PT Treatment/Exercise - 04/26/18 0001      Ambulation/Gait   Ambulation/Gait  Yes    Ambulation/Gait Assistance  5: Supervision    Ambulation Distance (Feet)  226 Feet    Assistive device  Hemi-walker    Gait Pattern  Step-through pattern    Ambulation Surface  Level;Indoor    Gait Comments  Improved mechanics with AFO, no toe drop of knee hyperextension with gait      Knee/Hip Exercises: Seated   Other Seated Knee/Hip Exercises  AROM DF, AAROM Invert/Evert 10x each (cueing to reduce compensation with knee and hip ER)      Modalities   Modalities  Electrical Stimulation      Electrical Stimulation   Electrical Stimulation Location  Rt peroneal, AT    Electrical Stimulation Action  Russian 10/30 x 8 minutes    Electrical Stimulation Parameters  as tolerated    Electrical Stimulation Goals  Strength;Neuromuscular facilitation          Balance Exercises - 04/26/18 1222      Balance Exercises: Standing   Tandem Stance  Eyes open;Foam/compliant surface;3 reps;30 secs    Marching Limitations  x10 reps foam with intermittent HHA with Rt LE WB          PT Short Term Goals - 04/10/18 1839      PT SHORT TERM GOAL #1   Title  Pt will be independent with HEP and perform consistently in order to maximize strength, balance, and independence at home.    Status  Achieved      PT SHORT TERM GOAL #2   Title  Pt will have improved MMT by 1/2 grade in order to maximize transfers, gait, and balance.    Baseline  04/10/18: see MMT    Status  On-going      PT SHORT TERM GOAL #3   Title  Pt will have improved BERG balance score to 40/56 to demo improved balance and decreased risk for falls.    Baseline  04/10/18: 39/56 was 29/56    Status  On-going        PT Long Term Goals - 04/10/18 1839      PT LONG TERM GOAL #1   Title  Pt will have improved MMT by 1 grade  throughout in order to further maximize functional mobility, balance, and decrease her risk for falls.    Baseline  04/10/18: see MMT    Status  On-going      PT LONG TERM GOAL #2   Title  Pt will have improved BERG balance score to 46/56 in order to further demo improved balance and decreased risk for falls.    Baseline  04/10/18: BERG 39/56      PT LONG TERM GOAL #3   Title  Pt will be able to perform the TUG in 45sec or better with LRAD to demo improved functional mobility and balance in order to maximize [facility] ambulation.     Baseline  04/10/2018: TUG 35" with hemiwalker and HHA for standing (was 50" eval)    Status  Achieved      PT LONG TERM GOAL #4   Title  Pt will have improved 5xSTS to 15sec or < without UE to demo improved functional strength and mobility.    Baseline  04/10/18: 5 STS in 16.05" with 1 HHA required    Status  On-going      PT LONG TERM GOAL #5   Title  Pt will have improved 3MWT by 13500ft or > with LRAD, mod I, and without evidence of R toe drag in order to demo improved functional mobility and maximize her community ambulation.     Baseline  04/10/18: 3MWT: 200 ft with hemi and noted toe drage per fatigue wiht task (was 130 ft eval)    Status  On-going            Plan - 04/26/18 1141    Clinical Impression Statement  Pt wearing new AFO today, improved gait mechanics with no toe drag or reports of knee hyperextension during ambulation.  Continued wiht [place]ian wiht good contraction obtained during [place]ian estimulation to Rt peroneal/AT.  Added AAROM wiht inversion/eversion motions wiht moderate difficulty due to weakness.  EOS with balance activities, continues to require HHA with Rt LE based SLS activities.  Able to demonstrate good stability with tandem stance on dynamic surface for 30 second holds.  No increased pain,  was limited by fatigue.      Rehab Potential  Good    PT Frequency  2x / week    PT Duration  6 weeks    PT Treatment/Interventions  ADLs/Self  Care Home Management;Aquatic Therapy;Cryotherapy;Electrical Stimulation;Moist Heat;Ultrasound;DME Instruction;Gait training;Functional mobility Network engineer;Therapeutic activities;Therapeutic exercise;Balance training;Neuromuscular re-education;Patient/family education;Orthotic Fit/Training;Manual techniques;Wheelchair mobility training;Scar mobilization;Passive range of motion;Dry needling;Energy conservation;Taping    PT Next Visit Plan  contniue [place] to R peroneals; Continue BLE and functional strengthening, balance work, R HS and gastroc strengthening, and coordination of RLE    PT Home Exercise Plan  eval: bridging, supine march, supine clams RTB 10/22: adduction, LAQ, heel/toe raises; 11/5: seated heel slides, supine heel slides, standing knee flexion; 11/19: sidelying clams, seated ankle eversion       Patient will benefit from skilled therapeutic intervention in order to improve the following deficits and impairments:  Abnormal gait, Decreased activity tolerance, Decreased balance, Decreased coordination, Decreased endurance, Decreased mobility, Decreased range of motion, Decreased strength, Difficulty walking, Increased muscle spasms, Impaired flexibility, Impaired UE functional use  Visit Diagnosis: Muscle weakness (generalized)  Difficulty in walking, not elsewhere classified  Unsteadiness on feet     Problem List Patient Active Problem List   Diagnosis Date Noted  . Hypothyroidism 11/29/2017  . Cerebrovascular accident (CVA) (HCC)   . Ischemic stroke (HCC)   . Type 2 diabetes mellitus with hyperglycemia (HCC) 11/25/2017  . HLD (hyperlipidemia) 11/25/2017  . Fibromyalgia 11/25/2017  . Depression 11/25/2017  . Tobacco abuse 11/25/2017  . Papule 03/22/2015  . Hypertension   . Acute diverticulitis 11/08/2014  . Diabetes (HCC) 11/08/2014  . Renal insufficiency 11/08/2014  . Abdominal pain 11/08/2014  . Left leg weakness 04/10/2011  . Abnormal gait 04/10/2011   . S/P total knee replacement 12/28/2010  . Knee pain 12/28/2010   Becky Sax, LPTA; CBIS [phone]  Juel Burrow 04/26/2018, 12:23 PM  [facility] [address] [place], [place], [address] Phone: [phone]   Fax:  [phone]  Name: [name] MRN: [id number] Date of Birth: [birth year]

## 2018-04-29 ENCOUNTER — Encounter (HOSPITAL_COMMUNITY): Payer: Medicare HMO

## 2018-04-29 ENCOUNTER — Ambulatory Visit (HOSPITAL_COMMUNITY): Payer: Medicare HMO | Admitting: Physical Therapy

## 2018-04-29 DIAGNOSIS — R29898 Other symptoms and signs involving the musculoskeletal system: Secondary | ICD-10-CM | POA: Diagnosis not present

## 2018-04-29 DIAGNOSIS — M6281 Muscle weakness (generalized): Secondary | ICD-10-CM

## 2018-04-29 DIAGNOSIS — M25511 Pain in right shoulder: Secondary | ICD-10-CM | POA: Diagnosis not present

## 2018-04-29 DIAGNOSIS — R2681 Unsteadiness on feet: Secondary | ICD-10-CM | POA: Diagnosis not present

## 2018-04-29 DIAGNOSIS — R262 Difficulty in walking, not elsewhere classified: Secondary | ICD-10-CM | POA: Diagnosis not present

## 2018-04-29 DIAGNOSIS — I69351 Hemiplegia and hemiparesis following cerebral infarction affecting right dominant side: Secondary | ICD-10-CM | POA: Diagnosis not present

## 2018-04-29 DIAGNOSIS — R278 Other lack of coordination: Secondary | ICD-10-CM | POA: Diagnosis not present

## 2018-04-29 NOTE — Therapy (Signed)
[facility] [address] [place], [place], [address] Phone: [phone]   Fax:  [phone]  Physical Therapy Treatment  Patient Details  Name: [name] MRN: [id number] Date of Birth: 06/26/1955 Referring Provider (PT): Benita StabileJohn Z Hall, MD   Encounter Date: 04/29/2018  PT End of Session - 04/29/18 1600    Visit Number  17    Number of Visits  29    Date for PT Re-Evaluation  05/23/18   Reassessment complete visit #14, 04/10/18   Authorization Type  [employer] Medicare HMO    Authorization Time Period  02/12/18 to 04/09/18; NEW: 04/10/18 to 05/23/18    Authorization - Visit Number  17    Authorization - Number of Visits  29    PT Start Time  1520    PT Stop Time  1605    PT Time Calculation (min)  45 min    Equipment Utilized During Treatment  Gait belt    Activity Tolerance  Patient limited by fatigue;Patient limited by pain;Patient tolerated treatment well   Rt UE pain   Behavior During Therapy  [facility] for tasks assessed/performed       Past Medical History:  Diagnosis Date  . Diabetes mellitus without complication (HCC)   . Diverticulitis   . Fibromyalgia   . Hyperlipidemia   . Hypertension   . Papule 03/22/2015  . Stroke [facility](HCC)     Past Surgical History:  Procedure Laterality Date  . CHOLECYSTECTOMY    . KNEE SURGERY Left     There were no vitals filed for this visit.  Subjective Assessment - 04/29/18 1521    Subjective  PT continues to have shoulder pain .    Patient Stated Goals  be able to walk better    Currently in Pain?  Yes    Pain Score  5     Pain Location  Shoulder   shoulder not to be addressed by PT being addressed by OT    Pain Orientation  Right    Pain Descriptors / Indicators  Aching    Pain Type  Acute pain    Pain Onset  More than a month ago    Pain Frequency  Intermittent    Aggravating Factors   motion    Pain Relieving Factors  rest    Effect of Pain on Daily Activities  limits                        [facility] Adult PT Treatment/Exercise - 04/29/18 0001      Ambulation/Gait   Ambulation/Gait  Yes    Ambulation/Gait Assistance  5: Supervision    Ambulation Distance (Feet)  226 Feet    Assistive device  Hemi-walker    Gait Pattern  Step-through pattern    Gait Comments  Rt knee continues to have hyperextension  with gt.  Decreased with verbal cuing to try and control.      Knee/Hip Exercises: Standing   Heel Raises  --    Functional Squat  15 reps      Knee/Hip Exercises: Seated   Long Arc Quad  Right;15 reps    Long Arc Quad Weight  4 lbs.    Other Seated Knee/Hip Exercises  AROM DF, AAROM Invert/Evert 10x each (cueing to reduce compensation with knee and hip ER)    Sit to Sand  15 reps      Knee/Hip Exercises: Sidelying   Other Sidelying Knee/Hip  Exercises  hamstring curl x 10 reps       Modalities   Modalities  Electrical Stimulation      Electrical Stimulation   Electrical Stimulation Location  Rt peroneal, AT    Statistician Action  Russian 10/20;     Electrical Stimulation Parameters  19 mA CC    Electrical Stimulation Goals  Strength;Neuromuscular facilitation          Balance Exercises - 04/29/18 1532      Balance Exercises: Standing   Sidestepping  2 reps    Marching Limitations  15        PT Education - 04/29/18 1559    Education Details  go slower with ambulation and concentrate to keep wt on Rt side and attempt to prevent hyperextesion of knee     Person(s) Educated  Patient    Methods  Explanation    Comprehension  Verbalized understanding       PT Short Term Goals - 04/10/18 1839      PT SHORT TERM GOAL #1   Title  Pt will be independent with HEP and perform consistently in order to maximize strength, balance, and independence at home.    Status  Achieved      PT SHORT TERM GOAL #2   Title  Pt will have improved MMT by 1/2 grade in order to maximize transfers, gait, and balance.    Baseline   04/10/18: see MMT    Status  On-going      PT SHORT TERM GOAL #3   Title  Pt will have improved BERG balance score to 40/56 to demo improved balance and decreased risk for falls.    Baseline  04/10/18: 39/56 was 29/56    Status  On-going        PT Long Term Goals - 04/10/18 1839      PT LONG TERM GOAL #1   Title  Pt will have improved MMT by 1 grade throughout in order to further maximize functional mobility, balance, and decrease her risk for falls.    Baseline  04/10/18: see MMT    Status  On-going      PT LONG TERM GOAL #2   Title  Pt will have improved BERG balance score to 46/56 in order to further demo improved balance and decreased risk for falls.    Baseline  04/10/18: BERG 39/56      PT LONG TERM GOAL #3   Title  Pt will be able to perform the TUG in 45sec or better with LRAD to demo improved functional mobility and balance in order to maximize [facility] ambulation.     Baseline  04/10/2018: TUG 35" with hemiwalker and HHA for standing (was 50" eval)    Status  Achieved      PT LONG TERM GOAL #4   Title  Pt will have improved 5xSTS to 15sec or < without UE to demo improved functional strength and mobility.    Baseline  04/10/18: 5 STS in 16.05" with 1 HHA required    Status  On-going      PT LONG TERM GOAL #5   Title  Pt will have improved by 131ft or > with LRAD, mod I, and without evidence of R toe drag in order to demo improved functional mobility and maximize her community ambulation.     Baseline  04/10/18: : 200 ft with hemi and noted toe drage per fatigue wiht task (was 130 ft eval)  Status  On-going            Plan - 04/29/18 1600    Clinical Impression Statement  PT tends to keep wt on the left when walking, encouraged to shift wt to RT.  PT also continues to hyperextend Rt knee therefore more hamstring activity will be added to pt.  Good Anterior tib contraction with E stim     Rehab Potential  Good    PT Frequency  2x / week    PT Duration  6  weeks    PT Treatment/Interventions  ADLs/Self Care Home Management;Aquatic Therapy;Cryotherapy;Electrical Stimulation;Moist Heat;Ultrasound;DME Instruction;Gait training;Functional mobility Network engineertraining;Stair training;Therapeutic activities;Therapeutic exercise;Balance training;Neuromuscular re-education;Patient/family education;Orthotic Fit/Training;Manual techniques;Wheelchair mobility training;Scar mobilization;Passive range of motion;Dry needling;Energy conservation;Taping    PT Next Visit Plan  contniue [place]ian to R peroneals; Continue BLE and functional strengthening, balance work, R HS and gastroc strengthening, and coordination of RLE    PT Home Exercise Plan  eval: bridging, supine march, supine clams RTB 10/22: adduction, LAQ, heel/toe raises; 11/5: seated heel slides, supine heel slides, standing knee flexion; 11/19: sidelying clams, seated ankle eversion       Patient will benefit from skilled therapeutic intervention in order to improve the following deficits and impairments:  Abnormal gait, Decreased activity tolerance, Decreased balance, Decreased coordination, Decreased endurance, Decreased mobility, Decreased range of motion, Decreased strength, Difficulty walking, Increased muscle spasms, Impaired flexibility, Impaired UE functional use  Visit Diagnosis: Muscle weakness (generalized)  Difficulty in walking, not elsewhere classified  Unsteadiness on feet  Other symptoms and signs involving the musculoskeletal system     Problem List Patient Active Problem List   Diagnosis Date Noted  . Hypothyroidism 11/29/2017  . Cerebrovascular accident (CVA) (HCC)   . Ischemic stroke (HCC)   . Type 2 diabetes mellitus with hyperglycemia (HCC) 11/25/2017  . HLD (hyperlipidemia) 11/25/2017  . Fibromyalgia 11/25/2017  . Depression 11/25/2017  . Tobacco abuse 11/25/2017  . Papule 03/22/2015  . Hypertension   . Acute diverticulitis 11/08/2014  . Diabetes (HCC) 11/08/2014  . Renal  insufficiency 11/08/2014  . Abdominal pain 11/08/2014  . Left leg weakness 04/10/2011  . Abnormal gait 04/10/2011  . S/P total knee replacement 12/28/2010  . Knee pain 12/28/2010  Michele Organynthia Esraa Seres, PT CLT [phone] 04/29/2018, 4:02 PM  [facility] [address] [place], [place], [address] Phone: [phone]   Fax:  [phone]  Name: [name] MRN: [id number] Date of Birth: 03/17/1956

## 2018-05-03 ENCOUNTER — Telehealth (HOSPITAL_COMMUNITY): Payer: Self-pay | Admitting: Internal Medicine

## 2018-05-03 ENCOUNTER — Ambulatory Visit (HOSPITAL_COMMUNITY): Payer: Medicare HMO | Admitting: Physical Therapy

## 2018-05-03 DIAGNOSIS — I639 Cerebral infarction, unspecified: Secondary | ICD-10-CM | POA: Diagnosis not present

## 2018-05-03 NOTE — Telephone Encounter (Signed)
05/03/18  pt left a message to cx because of the rain she is hoping to get an appointment 05/06/18

## 2018-05-07 ENCOUNTER — Telehealth (HOSPITAL_COMMUNITY): Payer: Self-pay

## 2018-05-07 ENCOUNTER — Ambulatory Visit (HOSPITAL_COMMUNITY): Payer: Medicare HMO | Attending: Internal Medicine

## 2018-05-07 DIAGNOSIS — M6281 Muscle weakness (generalized): Secondary | ICD-10-CM | POA: Insufficient documentation

## 2018-05-07 DIAGNOSIS — R262 Difficulty in walking, not elsewhere classified: Secondary | ICD-10-CM | POA: Insufficient documentation

## 2018-05-07 DIAGNOSIS — R2681 Unsteadiness on feet: Secondary | ICD-10-CM | POA: Insufficient documentation

## 2018-05-07 DIAGNOSIS — R29898 Other symptoms and signs involving the musculoskeletal system: Secondary | ICD-10-CM | POA: Insufficient documentation

## 2018-05-07 NOTE — Telephone Encounter (Signed)
No show, called and spoke to pt whostated she was unaware of apt today.  Reminded next apt date and time with verbalized understanding she will be there.  Requested pt to call and cancel/reschedule if unable to make it.  [address], LPTA; CBIS [phone]

## 2018-05-09 ENCOUNTER — Ambulatory Visit (HOSPITAL_COMMUNITY): Payer: Medicare HMO | Admitting: Physical Therapy

## 2018-05-09 DIAGNOSIS — R29898 Other symptoms and signs involving the musculoskeletal system: Secondary | ICD-10-CM | POA: Diagnosis not present

## 2018-05-09 DIAGNOSIS — R262 Difficulty in walking, not elsewhere classified: Secondary | ICD-10-CM | POA: Diagnosis not present

## 2018-05-09 DIAGNOSIS — M6281 Muscle weakness (generalized): Secondary | ICD-10-CM

## 2018-05-09 DIAGNOSIS — R2681 Unsteadiness on feet: Secondary | ICD-10-CM

## 2018-05-09 NOTE — Therapy (Signed)
[facility] [address] [place], [place], [address] Phone: [phone]   Fax:  [phone]  Physical Therapy Treatment  Patient Details  Name: [name] MRN: [id number] Date of Birth: 02-Jun-1955 Referring Provider (PT): Benita Stabile, MD   Encounter Date: 05/09/2018  PT End of Session - 05/09/18 1611    Visit Number  18    Number of Visits  29    Date for PT Re-Evaluation  05/23/18   Reassessment complete visit #14, 04/10/18   Authorization Type  [employer] Medicare HMO    Authorization Time Period  02/12/18 to 04/09/18; NEW: 04/10/18 to 05/23/18    Authorization - Visit Number  18    Authorization - Number of Visits  29    PT Start Time  1515    PT Stop Time  1604    PT Time Calculation (min)  49 min    Equipment Utilized During Treatment  Gait belt    Activity Tolerance  Patient limited by fatigue;Patient limited by pain;Patient tolerated treatment well   Rt UE pain   Behavior During Therapy  [facility] for tasks assessed/performed       Past Medical History:  Diagnosis Date  . Diabetes mellitus without complication (HCC)   . Diverticulitis   . Fibromyalgia   . Hyperlipidemia   . Hypertension   . Papule 03/22/2015  . Stroke [facility])     Past Surgical History:  Procedure Laterality Date  . CHOLECYSTECTOMY    . KNEE SURGERY Left     There were no vitals filed for this visit.  Subjective Assessment - 05/09/18 1518    Subjective  Pt states that she is doing her exercises at home.  She continues to walk at home.      Patient Stated Goals  be able to walk better    Currently in Pain?  Yes    Pain Score  8     Pain Location  Arm    Pain Orientation  Right    Pain Descriptors / Indicators  Aching    Pain Onset  More than a month ago                       [facility] Adult PT Treatment/Exercise - 05/09/18 0001      Ambulation/Gait   Ambulation/Gait  Yes    Ambulation/Gait Assistance  5: Supervision    Ambulation  Distance (Feet)  226 Feet    Assistive device  Hemi-walker    Gait Pattern  Step-through pattern    Gait Comments  Rt knee continues to have hyperextension  with gt.  Decreased with verbal cuing to try and control.      Knee/Hip Exercises: Supine   Bridges  2 sets;10 reps      Knee/Hip Exercises: Sidelying   Hip ABduction  Right;15 reps    Other Sidelying Knee/Hip Exercises  hamstring curl x 10 reps           Balance Exercises - 05/09/18 1521      Balance Exercises: Standing   Tandem Stance  Eyes open;2 reps    Sidestepping  2 reps    Marching Limitations  15    Sit to Stand Time  10    Other Standing Exercises  lunging B 5 x           PT Short Term Goals - 04/10/18 1839      PT SHORT TERM GOAL #1  Title  Pt will be independent with HEP and perform consistently in order to maximize strength, balance, and independence at home.    Status  Achieved      PT SHORT TERM GOAL #2   Title  Pt will have improved MMT by 1/2 grade in order to maximize transfers, gait, and balance.    Baseline  04/10/18: see MMT    Status  On-going      PT SHORT TERM GOAL #3   Title  Pt will have improved BERG balance score to 40/56 to demo improved balance and decreased risk for falls.    Baseline  04/10/18: 39/56 was 29/56    Status  On-going        PT Long Term Goals - 04/10/18 1839      PT LONG TERM GOAL #1   Title  Pt will have improved MMT by 1 grade throughout in order to further maximize functional mobility, balance, and decrease her risk for falls.    Baseline  04/10/18: see MMT    Status  On-going      PT LONG TERM GOAL #2   Title  Pt will have improved BERG balance score to 46/56 in order to further demo improved balance and decreased risk for falls.    Baseline  04/10/18: BERG 39/56      PT LONG TERM GOAL #3   Title  Pt will be able to perform the TUG in 45sec or better with LRAD to demo improved functional mobility and balance in order to [facility].      Baseline  04/10/2018: TUG 35" with hemiwalker and HHA for standing (was 50" eval)    Status  Achieved      PT LONG TERM GOAL #4   Title  Pt will have improved 5xSTS to 15sec or < without UE to demo improved functional strength and mobility.    Baseline  04/10/18: 5 STS in 16.05" with 1 HHA required    Status  On-going      PT LONG TERM GOAL #5   Title  Pt will have improved 3MWT by 15300ft or > with LRAD, mod I, and without evidence of R toe drag in order to demo improved functional mobility and maximize her community ambulation.     Baseline  04/10/18: 3MWT: 200 ft with hemi and noted toe drage per fatigue wiht task (was 130 ft eval)    Status  On-going            Plan - 05/09/18 1613    Clinical Impression Statement  PT has not returned to this clinic since the end of last month due to confusion with scheduling.  She continues to have significant weakness in her Rt LE but overall is improving in strength and balance.  Pt will continue to benefit from skilled PT to improve safety and gait.     Rehab Potential  Good    PT Frequency  2x / week    PT Duration  6 weeks    PT Treatment/Interventions  ADLs/Self Care Home Management;Aquatic Therapy;Cryotherapy;Electrical Stimulation;Moist Heat;Ultrasound;DME Instruction;Gait training;Functional mobility Network engineertraining;Stair training;Therapeutic activities;Therapeutic exercise;Balance training;Neuromuscular re-education;Patient/family education;Orthotic Fit/Training;Manual techniques;Wheelchair mobility training;Scar mobilization;Passive range of motion;Dry needling;Energy conservation;Taping    PT Next Visit Plan  contniue [place] to R peroneals as able Active strengthening is better if able ; Continue BLE and functional strengthening, balance work, R HS and gastroc strengthening, and coordination of RLE    PT Home Exercise Plan  eval: bridging, supine  march, supine clams RTB 10/22: adduction, LAQ, heel/toe raises; 11/5: seated heel slides, supine heel  slides, standing knee flexion; 11/19: sidelying clams, seated ankle eversion       Patient will benefit from skilled therapeutic intervention in order to improve the following deficits and impairments:  Abnormal gait, Decreased activity tolerance, Decreased balance, Decreased coordination, Decreased endurance, Decreased mobility, Decreased range of motion, Decreased strength, Difficulty walking, Increased muscle spasms, Impaired flexibility, Impaired UE functional use  Visit Diagnosis: Muscle weakness (generalized)  Difficulty in walking, not elsewhere classified  Unsteadiness on feet     Problem List Patient Active Problem List   Diagnosis Date Noted  . Hypothyroidism 11/29/2017  . Cerebrovascular accident (CVA) (HCC)   . Ischemic stroke (HCC)   . Type 2 diabetes mellitus with hyperglycemia (HCC) 11/25/2017  . HLD (hyperlipidemia) 11/25/2017  . Fibromyalgia 11/25/2017  . Depression 11/25/2017  . Tobacco abuse 11/25/2017  . Papule 03/22/2015  . Hypertension   . Acute diverticulitis 11/08/2014  . Diabetes (HCC) 11/08/2014  . Renal insufficiency 11/08/2014  . Abdominal pain 11/08/2014  . Left leg weakness 04/10/2011  . Abnormal gait 04/10/2011  . S/P total knee replacement 12/28/2010  . Knee pain 12/28/2010    Virgina Organynthia Osceola Depaz, PT CLT [phone] 05/09/2018, 4:18 PM  [facility] [address] [place] [place], [place], [address] Phone: [phone]   Fax:  [phone]  Name: [name] MRN: [id number] Date of Birth: 12/20/1955

## 2018-05-14 ENCOUNTER — Ambulatory Visit (HOSPITAL_COMMUNITY): Payer: Medicare HMO | Admitting: Physical Therapy

## 2018-05-14 ENCOUNTER — Encounter (HOSPITAL_COMMUNITY): Payer: Self-pay | Admitting: Physical Therapy

## 2018-05-14 DIAGNOSIS — M6281 Muscle weakness (generalized): Secondary | ICD-10-CM | POA: Diagnosis not present

## 2018-05-14 DIAGNOSIS — R2681 Unsteadiness on feet: Secondary | ICD-10-CM | POA: Diagnosis not present

## 2018-05-14 DIAGNOSIS — R262 Difficulty in walking, not elsewhere classified: Secondary | ICD-10-CM

## 2018-05-14 DIAGNOSIS — R29898 Other symptoms and signs involving the musculoskeletal system: Secondary | ICD-10-CM | POA: Diagnosis not present

## 2018-05-14 NOTE — Therapy (Signed)
[facility] [address] [place], [place], [address] Phone: [phone]   Fax:  [phone]  Physical Therapy Treatment  Patient Details  Name: [name] MRN: [id number] Date of Birth: 1956-04-28 Referring Provider (PT): Benita Stabile, MD   Encounter Date: 05/14/2018  PT End of Session - 05/14/18 1601    Visit Number  19    Number of Visits  29    Date for PT Re-Evaluation  05/23/18   Reassessment complete visit #14, 04/10/18   Authorization Type  [employer] Medicare HMO    Authorization Time Period  02/12/18 to 04/09/18; NEW: 04/10/18 to 05/23/18    Authorization - Visit Number  19    Authorization - Number of Visits  29    PT Start Time  1520    PT Stop Time  1608    PT Time Calculation (min)  48 min    Equipment Utilized During Treatment  Gait belt    Activity Tolerance  Patient limited by fatigue;Patient limited by pain;Patient tolerated treatment well   Rt UE pain   Behavior During Therapy  [facility] for tasks assessed/performed       Past Medical History:  Diagnosis Date  . Diabetes mellitus without complication (HCC)   . Diverticulitis   . Fibromyalgia   . Hyperlipidemia   . Hypertension   . Papule 03/22/2015  . Stroke [facility])     Past Surgical History:  Procedure Laterality Date  . CHOLECYSTECTOMY    . KNEE SURGERY Left     There were no vitals filed for this visit.  Subjective Assessment - 05/14/18 1556    Subjective  PT states that she is dizzy today.      Patient Stated Goals  be able to walk better    Currently in Pain?  Yes    Pain Score  5     Pain Location  Shoulder    Pain Orientation  Right    Pain Descriptors / Indicators  Aching    Pain Type  Chronic pain    Pain Onset  More than a month ago    Pain Frequency  Constant    Aggravating Factors   weather     Pain Relieving Factors  heat     Effect of Pain on Daily Activities  unable to use UE functionally                        [facility]  Adult PT Treatment/Exercise - 05/14/18 0001      Ambulation/Gait   Ambulation Distance (Feet)  150 Feet    Assistive device  Hemi-walker    Gait Pattern  Step-through pattern    Gait Comments  less buckling and pt is more upright instead of leaning to the left       Knee/Hip Exercises: Supine   Single Leg Bridge  Right;10 reps    Other Supine Knee/Hip Exercises  B DF x 10; then estim placed on RT to obtain full ROM       Knee/Hip Exercises: Sidelying   Hip ABduction  Right;15 reps    Other Sidelying Knee/Hip Exercises  hamstring curl; hip extension x 10 x 2 reps each       Modalities   Modalities  Electrical Stimulation      Electrical Stimulation   Electrical Stimulation Location  Rt peroneal, AT    Statistician Action  Russian stim 10/10     Electrical  Stimulation Parameters  40 Ma-cc x 14'    Electrical Stimulation Goals  Strength;Neuromuscular facilitation               PT Short Term Goals - 04/10/18 1839      PT SHORT TERM GOAL #1   Title  Pt will be independent with HEP and perform consistently in order to maximize strength, balance, and independence at home.    Status  Achieved      PT SHORT TERM GOAL #2   Title  Pt will have improved MMT by 1/2 grade in order to maximize transfers, gait, and balance.    Baseline  04/10/18: see MMT    Status  On-going      PT SHORT TERM GOAL #3   Title  Pt will have improved BERG balance score to 40/56 to demo improved balance and decreased risk for falls.    Baseline  04/10/18: 39/56 was 29/56    Status  On-going        PT Long Term Goals - 04/10/18 1839      PT LONG TERM GOAL #1   Title  Pt will have improved MMT by 1 grade throughout in order to further maximize functional mobility, balance, and decrease her risk for falls.    Baseline  04/10/18: see MMT    Status  On-going      PT LONG TERM GOAL #2   Title  Pt will have improved BERG balance score to 46/56 in order to further demo improved balance and  decreased risk for falls.    Baseline  04/10/18: BERG 39/56      PT LONG TERM GOAL #3   Title  Pt will be able to perform the TUG in 45sec or better with LRAD to demo improved functional mobility and balance in order to maximize [facility].     Baseline  04/10/2018: TUG 35" with hemiwalker and HHA for standing (was 50" eval)    Status  Achieved      PT LONG TERM GOAL #4   Title  Pt will have improved 5xSTS to 15sec or < without UE to demo improved functional strength and mobility.    Baseline  04/10/18: 5 STS in 16.05" with 1 HHA required    Status  On-going      PT LONG TERM GOAL #5   Title  Pt will have improved 3MWT by 16500ft or > with LRAD, mod I, and without evidence of R toe drag in order to demo improved functional mobility and maximize her community ambulation.     Baseline  04/10/18: 3MWT: 200 ft with hemi and noted toe drage per fatigue wiht task (was 130 ft eval)    Status  On-going            Plan - 05/14/18 1602    Clinical Impression Statement  PT complained of dizziness BP taken 155/84; pt stated that this was normal for her.  Treatment did not increase nor decrease sx of dizziness.  Pt continues to improve in her gait mechanics.      Rehab Potential  Good    PT Frequency  2x / week    PT Duration  6 weeks    PT Treatment/Interventions  ADLs/Self Care Home Management;Aquatic Therapy;Cryotherapy;Electrical Stimulation;Moist Heat;Ultrasound;DME Instruction;Gait training;Functional mobility Network engineertraining;Stair training;Therapeutic activities;Therapeutic exercise;Balance training;Neuromuscular re-education;Patient/family education;Orthotic Fit/Training;Manual techniques;Wheelchair mobility training;Scar mobilization;Passive range of motion;Dry needling;Energy conservation;Taping    PT Next Visit Plan  contniue [place] to R peroneals as able Active  strengthening is better if able ; Continue BLE and functional strengthening, balance work, R HS and gastroc strengthening, and  coordination of RLE    PT Home Exercise Plan  eval: bridging, supine march, supine clams RTB 10/22: adduction, LAQ, heel/toe raises; 11/5: seated heel slides, supine heel slides, standing knee flexion; 11/19: sidelying clams, seated ankle eversion       Patient will benefit from skilled therapeutic intervention in order to improve the following deficits and impairments:  Abnormal gait, Decreased activity tolerance, Decreased balance, Decreased coordination, Decreased endurance, Decreased mobility, Decreased range of motion, Decreased strength, Difficulty walking, Increased muscle spasms, Impaired flexibility, Impaired UE functional use  Visit Diagnosis: Muscle weakness (generalized)  Difficulty in walking, not elsewhere classified  Unsteadiness on feet     Problem List Patient Active Problem List   Diagnosis Date Noted  . Hypothyroidism 11/29/2017  . Cerebrovascular accident (CVA) (HCC)   . Ischemic stroke (HCC)   . Type 2 diabetes mellitus with hyperglycemia (HCC) 11/25/2017  . HLD (hyperlipidemia) 11/25/2017  . Fibromyalgia 11/25/2017  . Depression 11/25/2017  . Tobacco abuse 11/25/2017  . Papule 03/22/2015  . Hypertension   . Acute diverticulitis 11/08/2014  . Diabetes (HCC) 11/08/2014  . Renal insufficiency 11/08/2014  . Abdominal pain 11/08/2014  . Left leg weakness 04/10/2011  . Abnormal gait 04/10/2011  . S/P total knee replacement 12/28/2010  . Knee pain 12/28/2010    Virgina Organ, PT CLT [phone] 05/14/2018, 4:14 PM  [facility] [address] [place], [place], [address] Phone: [phone]   Fax:  [phone]  Name: RHODES BORUNDA MRN: [id number] Date of Birth: 1955-11-07

## 2018-05-16 ENCOUNTER — Ambulatory Visit (HOSPITAL_COMMUNITY): Payer: Medicare HMO

## 2018-05-16 ENCOUNTER — Telehealth (HOSPITAL_COMMUNITY): Payer: Self-pay

## 2018-05-16 ENCOUNTER — Telehealth (HOSPITAL_COMMUNITY): Payer: Self-pay | Admitting: Internal Medicine

## 2018-05-16 NOTE — Telephone Encounter (Signed)
05/16/18  pt called to cx because she wasn't feeling well

## 2018-05-16 NOTE — Telephone Encounter (Signed)
Originally thought no show.  Called and spoke to husband who stated they had already cancelled for today's apt.  Reminded next date and time.  [address], LPTA; CBIS [phone]

## 2018-05-17 DIAGNOSIS — I10 Essential (primary) hypertension: Secondary | ICD-10-CM | POA: Diagnosis not present

## 2018-05-17 DIAGNOSIS — I69351 Hemiplegia and hemiparesis following cerebral infarction affecting right dominant side: Secondary | ICD-10-CM | POA: Diagnosis not present

## 2018-05-20 ENCOUNTER — Ambulatory Visit (HOSPITAL_COMMUNITY): Payer: Medicare HMO | Admitting: Physical Therapy

## 2018-05-20 DIAGNOSIS — R2681 Unsteadiness on feet: Secondary | ICD-10-CM | POA: Diagnosis not present

## 2018-05-20 DIAGNOSIS — R262 Difficulty in walking, not elsewhere classified: Secondary | ICD-10-CM | POA: Diagnosis not present

## 2018-05-20 DIAGNOSIS — R29898 Other symptoms and signs involving the musculoskeletal system: Secondary | ICD-10-CM | POA: Diagnosis not present

## 2018-05-20 DIAGNOSIS — M6281 Muscle weakness (generalized): Secondary | ICD-10-CM | POA: Diagnosis not present

## 2018-05-20 NOTE — Therapy (Signed)
[facility] [address] [place], [place], [address] Phone: [phone]   Fax:  [phone]  Physical Therapy Treatment  Patient Details  Name: [name] MRN: [id number] Date of Birth: [birth year] Referring Provider (PT): Benita Stabile, MD   Encounter Date: 05/20/2018  PT End of Session - 05/20/18 1608    Visit Number  20    Number of Visits  29    Date for PT Re-Evaluation  05/23/18   Reassessment complete visit #14, 04/10/18   Authorization Type  [employer] Medicare HMO    Authorization Time Period  02/12/18 to 04/09/18; NEW: 04/10/18 to 05/23/18    Authorization - Visit Number  20    Authorization - Number of Visits  29    PT Start Time  1515    PT Stop Time  1555    PT Time Calculation (min)  40 min    Equipment Utilized During Treatment  Gait belt    Activity Tolerance  Patient limited by fatigue;Patient limited by pain;Patient tolerated treatment well   Rt UE pain   Behavior During Therapy  [facility] for tasks assessed/performed       Past Medical History:  Diagnosis Date  . Diabetes mellitus without complication (HCC)   . Diverticulitis   . Fibromyalgia   . Hyperlipidemia   . Hypertension   . Papule 03/22/2015  . Stroke [facility])     Past Surgical History:  Procedure Laterality Date  . CHOLECYSTECTOMY    . KNEE SURGERY Left     There were no vitals filed for this visit.  Subjective Assessment - 05/20/18 1541    Subjective  Patient stated that she feels okay but that her right arm is really bothering her.     Patient Stated Goals  be able to walk better    Currently in Pain?  Yes    Pain Score  9     Pain Location  Shoulder    Pain Orientation  Right    Pain Descriptors / Indicators  Aching    Pain Onset  More than a month ago                       [facility] Adult PT Treatment/Exercise - 05/20/18 0001      Ambulation/Gait   Ambulation Distance (Feet)  235 Feet    Assistive device  Hemi-walker    Gait  Pattern  Step-through pattern    Gait Comments  Patient with intermittent signs of right knee genu recurvatum, however demonstrating improved control       Knee/Hip Exercises: Standing   Forward Lunges  Right;Left;1 set;5 reps    Forward Lunges Limitations  moderate assistance for weight shift and to unlock the right knee          Balance Exercises - 05/20/18 1524      Balance Exercises: Standing   Standing Eyes Opened  Foam/compliant surface;30 secs;Limitations;Other (comment)   Split stance balance with 1 foot on Dyna disc 3 x 30'' ea LE   Sidestepping  2 reps    Step Over Hurdles / Cones  (3) 1-inch hurdles inside parallel bars 2 RT Forward and 2 RT sidestepping    Sit to Stand Time  10        PT Education - 05/20/18 1608    Education Details  Discussed balance challenge and why some activities may be more challenging than others based on her deficits.  Person(s) Educated  Patient    Methods  Explanation    Comprehension  Verbalized understanding       PT Short Term Goals - 04/10/18 1839      PT SHORT TERM GOAL #1   Title  Pt will be independent with HEP and perform consistently in order to maximize strength, balance, and independence at home.    Status  Achieved      PT SHORT TERM GOAL #2   Title  Pt will have improved MMT by 1/2 grade in order to maximize transfers, gait, and balance.    Baseline  04/10/18: see MMT    Status  On-going      PT SHORT TERM GOAL #3   Title  Pt will have improved BERG balance score to 40/56 to demo improved balance and decreased risk for falls.    Baseline  04/10/18: 39/56 was 29/56    Status  On-going        PT Long Term Goals - 04/10/18 1839      PT LONG TERM GOAL #1   Title  Pt will have improved MMT by 1 grade throughout in order to further maximize functional mobility, balance, and decrease her risk for falls.    Baseline  04/10/18: see MMT    Status  On-going      PT LONG TERM GOAL #2   Title  Pt will have improved  BERG balance score to 46/56 in order to further demo improved balance and decreased risk for falls.    Baseline  04/10/18: BERG 39/56      PT LONG TERM GOAL #3   Title  Pt will be able to perform the TUG in 45sec or better with LRAD to demo improved functional mobility and balance in order to maximize [facility] ambulation.     Baseline  04/10/2018: TUG 35" with hemiwalker and HHA for standing (was 50" eval)    Status  Achieved      PT LONG TERM GOAL #4   Title  Pt will have improved 5xSTS to 15sec or < without UE to demo improved functional strength and mobility.    Baseline  04/10/18: 5 STS in 16.05" with 1 HHA required    Status  On-going      PT LONG TERM GOAL #5   Title  Pt will have improved 3MWT by 17000ft or > with LRAD, mod I, and without evidence of R toe drag in order to demo improved functional mobility and maximize her community ambulation.     Baseline  04/10/18: 3MWT: 200 ft with hemi and noted toe drage per fatigue wiht task (was 130 ft eval)    Status  On-going            Plan - 05/20/18 1611    Clinical Impression Statement  This session patient arrived with no reports of dizziness. Continued to progress patient with both static and dynamic balance. This session added hurdle activity this session inside of parallel bars. Also added Dyna Disc balance activity. Patient performed lunges this session with moderate assistance. Patient performed all activities with intermittent rest breaks as needed.     Rehab Potential  Good    PT Frequency  2x / week    PT Duration  6 weeks    PT Treatment/Interventions  ADLs/Self Care Home Management;Aquatic Therapy;Cryotherapy;Electrical Stimulation;Moist Heat;Ultrasound;DME Instruction;Gait training;Functional mobility Network engineertraining;Stair training;Therapeutic activities;Therapeutic exercise;Balance training;Neuromuscular re-education;Patient/family education;Orthotic Fit/Training;Manual techniques;Wheelchair mobility training;Scar mobilization;Passive  range of motion;Dry needling;Energy conservation;Taping  PT Next Visit Plan  Continue with focus on dynamic and static standing balance. contniue [place] to R peroneals as able Active strengthening is better if able ; Continue BLE and functional strengthening, balance work, R HS and gastroc strengthening, and coordination of RLE    PT Home Exercise Plan  eval: bridging, supine march, supine clams RTB 10/22: adduction, LAQ, heel/toe raises; 11/5: seated heel slides, supine heel slides, standing knee flexion; 11/19: sidelying clams, seated ankle eversion       Patient will benefit from skilled therapeutic intervention in order to improve the following deficits and impairments:  Abnormal gait, Decreased activity tolerance, Decreased balance, Decreased coordination, Decreased endurance, Decreased mobility, Decreased range of motion, Decreased strength, Difficulty walking, Increased muscle spasms, Impaired flexibility, Impaired UE functional use  Visit Diagnosis: Muscle weakness (generalized)  Difficulty in walking, not elsewhere classified  Unsteadiness on feet     Problem List Patient Active Problem List   Diagnosis Date Noted  . Hypothyroidism 11/29/2017  . Cerebrovascular accident (CVA) (HCC)   . Ischemic stroke (HCC)   . Type 2 diabetes mellitus with hyperglycemia (HCC) 11/25/2017  . HLD (hyperlipidemia) 11/25/2017  . Fibromyalgia 11/25/2017  . Depression 11/25/2017  . Tobacco abuse 11/25/2017  . Papule 03/22/2015  . Hypertension   . Acute diverticulitis 11/08/2014  . Diabetes (HCC) 11/08/2014  . Renal insufficiency 11/08/2014  . Abdominal pain 11/08/2014  . Left leg weakness 04/10/2011  . Abnormal gait 04/10/2011  . S/P total knee replacement 12/28/2010  . Knee pain 12/28/2010   Verne Carrow PT, DPT 4:13 PM, 05/20/18 [phone]  [facility] [address] [place], [place], [address] Phone: [phone]   Fax:   [phone]  Name: [name] MRN: [id number] Date of Birth: 1956/01/01

## 2018-05-21 ENCOUNTER — Ambulatory Visit: Payer: Medicare HMO | Admitting: Adult Health

## 2018-05-22 ENCOUNTER — Ambulatory Visit (HOSPITAL_COMMUNITY): Payer: Medicare HMO

## 2018-05-22 ENCOUNTER — Encounter (HOSPITAL_COMMUNITY): Payer: Self-pay

## 2018-05-22 DIAGNOSIS — R29898 Other symptoms and signs involving the musculoskeletal system: Secondary | ICD-10-CM

## 2018-05-22 DIAGNOSIS — R262 Difficulty in walking, not elsewhere classified: Secondary | ICD-10-CM

## 2018-05-22 DIAGNOSIS — M6281 Muscle weakness (generalized): Secondary | ICD-10-CM

## 2018-05-22 DIAGNOSIS — R2681 Unsteadiness on feet: Secondary | ICD-10-CM | POA: Diagnosis not present

## 2018-05-22 NOTE — Patient Instructions (Signed)
Access Code: [email or web address]  URL: [email or web address]  Date: 05/22/2018  Prepared by: Jac Canavan   Exercises Supine Active Straight Leg Raise - 10 reps - 3 sets - 1x daily - 7x weekly Sidelying Hip Abduction - 10 reps - 3 sets - 1x daily - 7x weekly Side Stepping with Resistance at Ankles and Counter Support - 10 reps - 3 sets - 1x daily - 7x weekly Standing Hip Abduction Kicks - 10 reps - 3 sets - 1x daily - 7x weekly Standing Hip Extension Kicks - 10 reps - 3 sets - 1x daily - 7x weekly Seated Hamstring Curl with Anchored Resistance - 10 reps - 3 sets - 1x daily - 7x weekly Sit to Stand with Counter Support - 10 reps - 3 sets - 1x daily - 7x weekly Single Leg Stance - 10 reps - 3 sets - 1x daily - 7x weekly Standing 3-Way Kick - 10 reps - 3 sets - 1x daily - 7x weekly Tandem Stance in Corner - 10 reps - 3 sets - as long as you can hold - 1x daily - 7x weekly Tandem Stance with Eyes Closed in Corner - 10 reps - 3 sets - 1x daily - 7x weekly Standing March with Unilateral Counter Support - 10 reps - 3 sets                            - 1x daily - 7x weekly

## 2018-05-22 NOTE — Therapy (Signed)
[place], [place], [address] Phone: [phone]   Fax:  [phone]   Progress Note Reporting Period 04/10/18 to 05/22/18  See note below for Objective Data and Assessment of Progress/Goals.   Physical Therapy Treatment  Patient Details  Name: [name] MRN: [id number] Date of Birth: 21-Jan-1956 Referring Provider (PT): Celene Squibb, MD   Encounter Date: 05/22/2018  PT End of Session - 05/22/18 1516    Visit Number  21    Number of Visits  29    Date for PT Re-Evaluation  06/19/18    Authorization Type  [employer] H[employer]    Authorization Time Period  02/12/18 to 04/09/18; NEW: 04/10/18 to 05/23/18; 4-week HEP POC: 05/22/18 to 06/19/18    Authorization - Visit Number  21    Authorization - Number of Visits  29    PT Start Time  1510    PT Stop Time  1615    PT Time Calculation (min)  65 min    Equipment Utilized During Treatment  Gait belt    Activity Tolerance  Patient limited by fatigue;Patient limited by pain;Patient tolerated treatment well   Rt UE pain   Behavior During Therapy  [facility] for tasks assessed/performed       Past Medical History:  Diagnosis Date  . Diabetes mellitus without complication ([place])   . Diverticulitis   . Fibromyalgia   . Hyperlipidemia   . Hypertension   . Papule 03/22/2015  . Stroke [facility])     Past Surgical History:  Procedure Laterality Date  . CHOLECYSTECTOMY    . KNEE SURGERY Left     There were no vitals filed for this visit.  Subjective Assessment - 05/22/18 1516    Subjective  Pt reports that her shoulder is still hurting. She goes to the doctor Monday about potentially starting Botox.     Patient Stated Goals  be able to walk better    Currently in Pain?  Yes    Pain Score  8     Pain Location  Shoulder    Pain Orientation  Right    Pain Descriptors / Indicators  Aching;Dull    Pain Type  Chronic pain    Pain Onset  More than a month ago    Pain  Frequency  Constant    Aggravating Factors   weather    Pain Relieving Factors  heat    Effect of Pain on Daily Activities  unable to use UE functionally         [facility] PT Assessment - 05/22/18 0001      Assessment   Medical Diagnosis  Right hemiparesis s/p CVA    Referring Provider (PT)  Celene Squibb, MD    Onset Date/Surgical Date  11/25/17    Next MD Visit  06/13/18   for Dr. Nevada Crane; goes Monday 05/27/18 to Neurologist   Prior [place] (d/c on 02/07/18)      Strength   Right Hip Flexion  4/5    Right Hip Extension  3+/5   in sitting, pt resisting hip flexion   Right Hip ABduction  4/5   in sitting   Left Hip Flexion  5/5    Left Hip Extension  4/5   in sitting, pt resisting hip flexion   Left Hip ABduction  4+/5   in sitting   Right Knee Flexion  4/5   in sitting  Right Knee Extension  5/5    Left Knee Flexion  4+/5   in sitting   Left Knee Extension  5/5    Right Ankle Dorsiflexion  3+/5    Left Ankle Dorsiflexion  4+/5      Ambulation/Gait   Ambulation/Gait Assistance  5: Supervision    Ambulation Distance (Feet)  146 Feet   3MWT   Assistive device  Hemi-walker      Standardized Balance Assessment   Standardized Balance Assessment  Berg Balance Test;Five Times Sit to Stand    Five times sit to stand comments   25sec, chair, 1 UE assist      Berg Balance Test   Sit to Stand  Able to stand  independently using hands    Standing Unsupported  Able to stand safely 2 minutes    Sitting with Back Unsupported but Feet Supported on Floor or Stool  Able to sit safely and securely 2 minutes    Stand to Sit  Sits safely with minimal use of hands    Transfers  Able to transfer safely, definite need of hands    Standing Unsupported with Eyes Closed  Able to stand 10 seconds safely    Standing Ubsupported with Feet Together  Able to place feet together independently and stand for 1 minute with supervision    From Standing, Reach Forward with Outstretched  Arm  Can reach forward >12 cm safely (5")    From Standing Position, Pick up Object from Floor  Able to pick up shoe, needs supervision    From Standing Position, Turn to Look Behind Over each Shoulder  Looks behind from both sides and weight shifts well    Turn 360 Degrees  Able to turn 360 degrees safely but slowly    Standing Unsupported, Alternately Place Feet on Step/Stool  Able to complete >2 steps/needs minimal assist    Standing Unsupported, One Foot in Front  Needs help to step but can hold 15 seconds    Standing on One Leg  Tries to lift leg/unable to hold 3 seconds but remains standing independently    Total Score  40             PT Education - 05/22/18 1516    Education Details  reassessment findings    Person(s) Educated  Patient    Methods  Demonstration;Explanation    Comprehension  Verbalized understanding;Returned demonstration           PT Short Term Goals - 05/22/18 1518      PT SHORT TERM GOAL #1   Title  Pt will be independent with HEP and perform consistently in order to maximize strength, balance, and independence at home.    Status  Achieved      PT SHORT TERM GOAL #2   Title  Pt will have improved MMT by 1/2 grade in order to maximize transfers, gait, and balance.    Baseline  1/22: see MMT    Status  Partially Met      PT SHORT TERM GOAL #3   Title  Pt will have improved BERG balance score to 40/56 to demo improved balance and decreased risk for falls.    Baseline  1/22: 40/56    Status  Achieved        PT Long Term Goals - 05/22/18 1517      PT LONG TERM GOAL #1   Title  Pt will have improved MMT by 1 grade throughout in order to  further maximize functional mobility, balance, and decrease her risk for falls.    Baseline  1/22: see MMT    Status  Partially Met      PT LONG TERM GOAL #2   Title  Pt will have improved BERG balance score to 46/56 in order to further demo improved balance and decreased risk for falls.    Baseline  1/22:  40/56    Status  On-going      PT LONG TERM GOAL #3   Title  Pt will be able to perform the TUG in 45sec or better with LRAD to demo improved functional mobility and balance in order to maximize [facility] ambulation.     Baseline  04/10/2018: TUG 35" with hemiwalker and HHA for standing (was 50" eval)    Status  Achieved      PT LONG TERM GOAL #4   Title  Pt will have improved 5xSTS to 15sec or < without UE to demo improved functional strength and mobility.    Baseline  1/22: 25sec 1 UE    Status  On-going      PT LONG TERM GOAL #5   Title  Pt will have improved 3MWT by 1107f or > with LRAD, mod I, and without evidence of R toe drag in order to demo improved functional mobility and maximize her community ambulation.     Baseline  1/22: supervision, intermittent toe drag still noted    Status  On-going            Plan - 05/22/18 1628    Clinical Impression Statement  PT reassessed pt's goals and outcome measures this date. Pt has overall made great progress towards goals. She has achieved 2/3 STG, partially met the other STG, while making progress towards all LTG (achieved 1, partially met 1, others on-going with progress towards). Her MMT has either maintained or improved by 1/2-1 grade since initial eval, her BERG has dramatically improved from 29 to 40/56, and her overall gait mechanics have improved though limited by fatigue. Pt verbalizing that her main limiting factor at this point is her R shoulder pain, which she is about to start Botox for to help reduce her tone and pain. Pt verbalizing that she and her husband work on her HEP daily which includes walking and her exercises. Pt reporting that she feels comfortable working on her strengthening and balance at home with her husband. PT provided pt with extensive HEP additions and educated pt on how to perform and she verbalized understanding. Educated pt and husband that she will have a f/u appointment in 1 month but if she feels her strength  and balance have maintained or improved, then she can cancel the appointment and she will be d/c at that time; however, if they feel her strength and balance have reduced, then she can utilize that appointment for a reassessment.      Rehab Potential  Good    PT Frequency  2x / week    PT Duration  6 weeks    PT Treatment/Interventions  ADLs/Self Care Home Management;Aquatic Therapy;Cryotherapy;Electrical Stimulation;Moist Heat;Ultrasound;DME Instruction;Gait training;Functional mobility tScientist, forensicTherapeutic activities;Therapeutic exercise;Balance training;Neuromuscular re-education;Patient/family education;Orthotic Fit/Training;Manual techniques;Wheelchair mobility training;Scar mobilization;Passive range of motion;Dry needling;Energy conservation;Taping    PT Next Visit Plan  reassess and/or d/c after 4-week HEP POC    PT Home Exercise Plan  eval: bridging, supine march, supine clams RTB 10/22: adduction, LAQ, heel/toe raises; 11/5: seated heel slides, supine heel slides, standing knee flexion; 11/19: sidelying clams,  seated ankle eversion; 1/22: see below for extensive additions    Consulted and Agree with Plan of Care  Patient;Family member/caregiver    Family Member Consulted  husband       Patient will benefit from skilled therapeutic intervention in order to improve the following deficits and impairments:  Abnormal gait, Decreased activity tolerance, Decreased balance, Decreased coordination, Decreased endurance, Decreased mobility, Decreased range of motion, Decreased strength, Difficulty walking, Increased muscle spasms, Impaired flexibility, Impaired UE functional use  Visit Diagnosis: Muscle weakness (generalized) - Plan: PT plan of care cert/re-cert  Difficulty in walking, not elsewhere classified - Plan: PT plan of care cert/re-cert  Unsteadiness on feet - Plan: PT plan of care cert/re-cert  Other symptoms and signs involving the musculoskeletal system - Plan: PT plan  of care cert/re-cert     Problem List Patient Active Problem List   Diagnosis Date Noted  . Hypothyroidism 11/29/2017  . Cerebrovascular accident (CVA) ([place])   . Ischemic stroke ([place])   . Type 2 diabetes mellitus with hyperglycemia ([place]) 11/25/2017  . HLD (hyperlipidemia) 11/25/2017  . Fibromyalgia 11/25/2017  . Depression 11/25/2017  . Tobacco abuse 11/25/2017  . Papule 03/22/2015  . Hypertension   . Acute diverticulitis 11/08/2014  . Diabetes ([place]) 11/08/2014  . Renal insufficiency 11/08/2014  . Abdominal pain 11/08/2014  . Left leg weakness 04/10/2011  . Abnormal gait 04/10/2011  . S/P total knee replacement 12/28/2010  . Knee pain 12/28/2010        Geraldine Solar PT, DPT  [facility] [address] [place], [place], [address] Phone: [phone]   Fax:  [phone]  Name: [name] MRN: [id number] Date of Birth: 07/25/1955

## 2018-05-27 ENCOUNTER — Encounter: Payer: Self-pay | Admitting: Neurology

## 2018-05-27 ENCOUNTER — Ambulatory Visit: Payer: Medicare HMO | Admitting: Neurology

## 2018-05-27 VITALS — BP 151/85 | HR 78 | Ht 63.0 in | Wt 161.4 lb

## 2018-05-27 DIAGNOSIS — G8110 Spastic hemiplegia affecting unspecified side: Secondary | ICD-10-CM

## 2018-05-27 MED ORDER — BACLOFEN 5 MG PO TABS: 5.0000 mg | ORAL_TABLET | Freq: Two times a day (BID) | ORAL | 2 refills | Status: DC

## 2018-05-27 NOTE — Progress Notes (Signed)
[facility] [address] [place] [address] [phone]       OFFICE CONSULT NOTE  [name] Date of Birth:  Aug 17, 1955 Medical Record Number:  [id number]   Referring MD: Jannette Fogo, NP Reason for Referral: Stroke HPI: [name] is a pleasant 63 year old Caucasian lady who was seen today for initial office consultation visit.  She is accompanied by her niece [name].  History is obtained from the patient and review of electronic medical records.  I personally reviewed imaging films.  She presented to [facility] on 11/25/2017 with worsening of right-sided weakness.  Her symptoms actually began 2 weeks ago when she had some slurred speech and weakness but she did not seek medical help.  2 weeks later when her symptoms worsen she presented to the ER.  She was seen by telemetry neurologist and not felt to be candidate for TPA.  NIH stroke scale on admission was 10.  CT scan of the head showed possible subacute left subcortical white matter infarct.  Basic admission labs were unremarkable.  MRI scan of the brain personally reviewed by me shows a large 2 cm left basal ganglia subcortical infarct without hemorrhagic transformation.  There is a remote age lacunar infarct noted in the right basal ganglia as well.  Carotid ultrasound showed mild plaque but no significant extracranial stenosis.  MRA of the brain showed mild atheromatous irregularities in bilateral P2 segments of posterior cerebral arteries only.  LDL cholesterol was elevated at 121 mg percent and hemoglobin A1c was 9.5.  Transthoracic echo showed normal ejection fraction.  Patient subsequently had outpatient 30-day Holter monitor which did not reveal any cardiac arrhythmias.  Patient was transferred from [facility] to [facility] for inpatient rehab for about 3 to 4 weeks.  A wheelchair ran over her right foot causing some fractures.  This possibly affect her rehab potential.  She is  still at home now but getting home therapy but is yet unable to walk even with 2% therapist assist she can barely stand.  Patient speech has improved though she is has occasionally slurs on few words.  She is able to move the right arm and leg but does not have much purposeful use yet.  She is tolerating aspirin well without bleeding or bruising.  Her blood pressure seems to be under better control now today it is still elevated in office in 1 nine 5/83.  She has finished home speech therapy and is still getting physical and occupational therapy only. Update 05/27/2018 : She returns for follow-up after last visit 3 months ago.  She is accompanied by her husband.  Patient has made progress and finished her physical therapy a few weeks ago.  She is now able to ambulate with a hemiwalker and does so fairly safely without any falls.  She still has significant weakness in the right grip and hand as well as spasticity.  She is interested in Botox which has been recommended by her therapist.  She has not yet tried baclofen.  She decided not to participate in the [place] stroke trial.  She remains on aspirin which is tolerating well without bruising or bleeding.  She states her blood pressure is usually better controlled with today it is elevated in office at 151/85.  She is doing home therapy exercises that she has learned.  She has no new complaints. ROS:   14 system review of systems is positive for weakness,   difficulty walking and all  other systems negative  PMH:  Past Medical History:  Diagnosis Date  . Diabetes mellitus without complication ([place])   . Diverticulitis   . Fibromyalgia   . Hyperlipidemia   . Hypertension   . Papule 03/22/2015  . Stroke [facility])     Social History:  Social History   Socioeconomic History  . Marital status: Married    Spouse name: Not on file  . Number of children: Not on file  . Years of education: Not on file  . Highest education level: Not on file  Occupational  History  . Not on file  Social Needs  . Financial resource strain: Not on file  . Food insecurity:    Worry: Not on file    Inability: Not on file  . Transportation needs:    Medical: Not on file    Non-medical: Not on file  Tobacco Use  . Smoking status: Former Smoker    Packs/day: 1.50    Years: 38.00    Pack years: 57.00    Types: Cigarettes    Last attempt to quit: 03/26/2001    Years since quitting: 17.1  . Smokeless tobacco: Never Used  Substance and Sexual Activity  . Alcohol use: Yes    Alcohol/week: 1.0 standard drinks    Types: 1 Glasses of wine per week    Comment: occ glass of wine  . Drug use: No  . Sexual activity: Never    Birth control/protection: Post-menopausal  Lifestyle  . Physical activity:    Days per week: Not on file    Minutes per session: Not on file  . Stress: Not on file  Relationships  . Social connections:    Talks on phone: Not on file    Gets together: Not on file    Attends religious service: Not on file    Active member of club or organization: Not on file    Attends meetings of clubs or organizations: Not on file    Relationship status: Not on file  . Intimate partner violence:    Fear of current or ex partner: Not on file    Emotionally abused: Not on file    Physically abused: Not on file    Forced sexual activity: Not on file  Other Topics Concern  . Not on file  Social History Narrative  . Not on file    Medications:   Current Outpatient Medications on File Prior to Visit  Medication Sig Dispense Refill  . amLODipine (NORVASC) 10 MG tablet Take 10 mg by mouth at bedtime.  2  . aspirin 325 MG tablet Take 1 tablet (325 mg total) by mouth daily.    Marland Kitchen atorvastatin (LIPITOR) 40 MG tablet Take 40 mg by mouth daily.    . Blood Glucose Monitoring Suppl (ONETOUCH VERIO) w/Device KIT USE TO CHECK GLUCOSE THREE TIMES DAILY  0  . cloNIDine (CATAPRES) 0.1 MG tablet Take 0.1 mg by mouth 2 (two) times daily.    . diclofenac sodium  (VOLTAREN) 1 % GEL Apply 3 g to 3 large joints up to 3 times a day when necessary 3 Tube 3  . insulin detemir (LEVEMIR) 100 UNIT/ML injection Inject 0.3 mLs (30 Units total) into the skin daily.    Marland Kitchen losartan (COZAAR) 100 MG tablet Take 100 mg by mouth daily.  0  . metFORMIN (GLUCOPHAGE) 1000 MG tablet Take 1 tablet (1,000 mg total) by mouth 2 (two) times daily with a meal.    . olmesartan (BENICAR)  40 MG tablet Take 40 mg by mouth daily.    Marland Kitchen omega-3 acid ethyl esters (LOVAZA) 1 g capsule Take 1 capsule (1 g total) by mouth 2 (two) times daily.    Glory Rosebush VERIO test strip USE 1 STRIP TO CHECK GLUCOSE THREE TIMES DAILY  3  . albuterol (PROVENTIL HFA;VENTOLIN HFA) 108 (90 Base) MCG/ACT inhaler INHALE 2 PUFFS BY MOUTH EVERY 6 HOURS AS NEEDED FOR SHORTNESS OF BREATH OR WHEEZING    . fenofibrate 160 MG tablet Take 160 mg by mouth daily.   1  . hydrALAZINE (APRESOLINE) 10 MG tablet Take 10 mg by mouth 2 (two) times daily.  2  . simvastatin (ZOCOR) 40 MG tablet TAKE 1 TABLET BY MOUTH ONCE DAILY FOR CHOLESTEROL  2  . SOLIQUA 100-33 UNT-MCG/ML SOPN INJECT 25 UNITS SUBCUTANEOUSLY BEFORE BREAKFAST (USE LESS THAN 1 HOUR BEFORE FIRST MEAL OF THE DAY)  2   No current facility-administered medications on file prior to visit.     Allergies:   Allergies  Allergen Reactions  . Bee Pollen Anaphylaxis and Swelling  . Bee Venom   . Codeine     REACTION: nauseated,claustrophobic    Physical Exam General: Frail middle-aged Caucasian lady, seated, in no evident distress Head: head normocephalic and atraumatic.   Neck: supple with no carotid or supraclavicular bruits Cardiovascular: regular rate and rhythm, no murmurs Musculoskeletal: no deformity Skin:  no rash/petichiae Vascular:  Normal pulses all extremities  Neurologic Exam Mental Status: Awake and fully alert. Oriented to place and time. Recent and remote memory intact. Attention span, concentration and fund of knowledge appropriate. Mood and  affect appropriate.  Mild dysarthria and occasional word finding difficulties but can be easily understood. Cranial Nerves: Fundoscopic exam not done. Pupils equal, briskly reactive to light. Extraocular movements full without nystagmus. Visual fields full to confrontation. Hearing intact. Facial sensation intact.  Right lower facial weakness., tongue, palate moves normally and symmetrically.  Motor: Spastic right hemiplegia with 2/5 right upper extremity and 4/5 right lower extremity strength.  Significant weakness of right grip and intrinsic hand muscles.mild right foot drop  Tone is increased on the right compared to the left.  Normal strength tone coordination on the left side.   Sensory.: intact to touch , pinprick , position and vibratory sensation.  Coordination: Rapid alternating movements normal in all extremities. Finger-to-nose and heel-to-shin performed accurately bilaterally. Gait and Station: Unable to test as patient is in a wheelchair and did not bring her walker Reflexes: 1+ and asymmetric and brisker on the right. Toes downgoing.   NIHSS  7 Modified Rankin 3  ASSESSMENT: 70 year Caucasian lady with large left basal ganglia infarct of cryptogenic etiology in July 2019 with residual mild dysarthria and spastic right hemiplegia.  Vascular risk factors of hypertension, diabetes, hyperlipidemia and cerebrovascular disease    PLAN: I had a long discussion with the patient and her husband regarding her spastic right hemiplegia and discuss treatment options including baclofen and Botox.  I recommend she start baclofen 5 mg twice daily for a week increase if tolerated to 3 times daily and thereafter.  I discussed possible side effects with them and asked him to call me if needed.  I will also refer the patient to Dr. Krista Blue for Botox for her spastic right hemiplegia.  She will continue on aspirin for stroke prevention and maintain strict control of her modifiable risk factors with systolic  blood pressure goal below 130/90, lipids with LDL cholesterol goal below 70  mg percent and diabetes with hemoglobin A1c goal below 6.5%.  She was also encouraged to eat a healthy diet and be active.  She was advised to use a hemiwalker at all times for ambulation and we also discussed fall and safety precautions.  She will return for follow-up in the future only as necessary. Greater than 50% time during this 25-minute  visit was spent in counseling and coordination of care about her cryptogenic stroke, spastic hemiplegia and discussion about stroke prevention and treatment and answering questions.   Antony Contras, MD  [facility] [address] [place], [place] [address]  Phone [phone] Fax [phone] Note: This document was prepared with digital dictation and possible smart phrase technology. Any transcriptional errors that result from this process are unintentional.

## 2018-05-27 NOTE — Patient Instructions (Signed)
I had a long discussion with the patient and her husband regarding her spastic right hemiplegia and discuss treatment options including baclofen and Botox.  I recommend she start baclofen 5 mg twice daily for a week increase if tolerated to 3 times daily and thereafter.  I discussed possible side effects with them and asked him to call me if needed.  I will also refer the patient to Dr. Terrace Arabia for Botox for her spastic right hemiplegia.  She will continue on aspirin for stroke prevention and maintain strict control of her modifiable risk factors with systolic blood pressure goal below 130/90, lipids with LDL cholesterol goal below 70 mg percent and diabetes with hemoglobin A1c goal below 6.5%.  She was also encouraged to eat a healthy diet and be active.  She was advised to use a hemiwalker at all times for ambulation and we also discussed fall and safety precautions.  She will return for follow-up in the future only as necessary.

## 2018-05-28 ENCOUNTER — Encounter: Payer: Self-pay | Admitting: *Deleted

## 2018-05-28 ENCOUNTER — Ambulatory Visit (HOSPITAL_COMMUNITY): Payer: Medicare HMO

## 2018-05-28 ENCOUNTER — Telehealth: Payer: Self-pay | Admitting: Neurology

## 2018-05-28 DIAGNOSIS — G8111 Spastic hemiplegia affecting right dominant side: Secondary | ICD-10-CM | POA: Insufficient documentation

## 2018-05-28 NOTE — Telephone Encounter (Signed)
Per vo by Dr. Terrace Arabia, she will do the consultation and injection in the same visit.

## 2018-05-28 NOTE — Telephone Encounter (Signed)
Apt type has been changed. DW

## 2018-05-28 NOTE — Telephone Encounter (Signed)
[name], this patient is a referral from Dr. Pearlean BrownieSethi. [name] is calling to schedule the patient but I wasn't sure if Dr. Terrace ArabiaYan would want to inject day of or just have a consultation. Please advise.

## 2018-05-30 ENCOUNTER — Ambulatory Visit (HOSPITAL_COMMUNITY): Payer: Medicare HMO

## 2018-06-03 DIAGNOSIS — Z8673 Personal history of transient ischemic attack (TIA), and cerebral infarction without residual deficits: Secondary | ICD-10-CM | POA: Diagnosis not present

## 2018-06-03 DIAGNOSIS — R2242 Localized swelling, mass and lump, left lower limb: Secondary | ICD-10-CM | POA: Diagnosis not present

## 2018-06-03 DIAGNOSIS — I639 Cerebral infarction, unspecified: Secondary | ICD-10-CM | POA: Diagnosis not present

## 2018-06-03 DIAGNOSIS — I10 Essential (primary) hypertension: Secondary | ICD-10-CM | POA: Diagnosis not present

## 2018-06-04 ENCOUNTER — Ambulatory Visit (HOSPITAL_COMMUNITY): Payer: Medicare HMO | Admitting: Physical Therapy

## 2018-06-06 ENCOUNTER — Ambulatory Visit (HOSPITAL_COMMUNITY): Payer: Medicare HMO

## 2018-06-11 ENCOUNTER — Ambulatory Visit (HOSPITAL_COMMUNITY): Payer: Medicare HMO | Admitting: Physical Therapy

## 2018-06-13 DIAGNOSIS — E1165 Type 2 diabetes mellitus with hyperglycemia: Secondary | ICD-10-CM | POA: Diagnosis not present

## 2018-06-13 DIAGNOSIS — I10 Essential (primary) hypertension: Secondary | ICD-10-CM | POA: Diagnosis not present

## 2018-06-13 DIAGNOSIS — E782 Mixed hyperlipidemia: Secondary | ICD-10-CM | POA: Diagnosis not present

## 2018-06-13 DIAGNOSIS — R946 Abnormal results of thyroid function studies: Secondary | ICD-10-CM | POA: Diagnosis not present

## 2018-06-18 DIAGNOSIS — E782 Mixed hyperlipidemia: Secondary | ICD-10-CM | POA: Diagnosis not present

## 2018-06-18 DIAGNOSIS — M2450 Contracture, unspecified joint: Secondary | ICD-10-CM | POA: Diagnosis not present

## 2018-06-18 DIAGNOSIS — E039 Hypothyroidism, unspecified: Secondary | ICD-10-CM | POA: Diagnosis not present

## 2018-06-18 DIAGNOSIS — Z8673 Personal history of transient ischemic attack (TIA), and cerebral infarction without residual deficits: Secondary | ICD-10-CM | POA: Diagnosis not present

## 2018-06-18 DIAGNOSIS — I10 Essential (primary) hypertension: Secondary | ICD-10-CM | POA: Diagnosis not present

## 2018-06-18 DIAGNOSIS — E1169 Type 2 diabetes mellitus with other specified complication: Secondary | ICD-10-CM | POA: Diagnosis not present

## 2018-06-18 DIAGNOSIS — R69 Illness, unspecified: Secondary | ICD-10-CM | POA: Diagnosis not present

## 2018-06-19 ENCOUNTER — Ambulatory Visit (HOSPITAL_COMMUNITY): Payer: Medicare HMO

## 2018-06-19 ENCOUNTER — Telehealth (HOSPITAL_COMMUNITY): Payer: Self-pay | Admitting: Internal Medicine

## 2018-06-19 NOTE — Telephone Encounter (Signed)
06/19/18  Brooke asked me to call and see if the patient was planning on coming in this afternoon.  I left a message and asked that someone call us back to let us know.  Or if she was doing ok that she didn't need to come in to just let us know.

## 2018-06-20 ENCOUNTER — Encounter (HOSPITAL_COMMUNITY): Payer: Self-pay

## 2018-06-20 NOTE — Therapy (Signed)
[place], [place], [address] Phone: [phone]   Fax:  [phone]  Patient Details  Name: [name] MRN: [id number] Date of Birth: Apr 26, 1956 Referring Provider:  No ref. provider found  Encounter Date: 06/20/2018   PHYSICAL THERAPY DISCHARGE SUMMARY  Visits from Start of Care: 21  Current functional level related to goals / functional outcomes: See last note   Remaining deficits: See last note   Education / Equipment: N/a - see last note  Plan: Patient agrees to discharge.  Patient goals were partially met. Patient is being discharged due to being pleased with the current functional level.  ?????    Geraldine Solar PT, [place] [address] [place], [place], [address] Phone: [phone]   Fax:  [phone]

## 2018-07-02 DIAGNOSIS — I639 Cerebral infarction, unspecified: Secondary | ICD-10-CM | POA: Diagnosis not present

## 2018-07-08 ENCOUNTER — Telehealth: Payer: Self-pay | Admitting: Neurology

## 2018-07-08 DIAGNOSIS — J309 Allergic rhinitis, unspecified: Secondary | ICD-10-CM | POA: Diagnosis not present

## 2018-07-08 DIAGNOSIS — H814 Vertigo of central origin: Secondary | ICD-10-CM | POA: Diagnosis not present

## 2018-07-08 DIAGNOSIS — I10 Essential (primary) hypertension: Secondary | ICD-10-CM | POA: Diagnosis not present

## 2018-07-08 DIAGNOSIS — M62838 Other muscle spasm: Secondary | ICD-10-CM | POA: Diagnosis not present

## 2018-07-08 NOTE — Telephone Encounter (Signed)
Okay to take it twice a day if it is helping and not making her dizzy

## 2018-07-08 NOTE — Telephone Encounter (Signed)
I called patients husband about the baclofen causing headaches. He stated pt is having dizzy spells not headaches. Pt is no longer taking It three times a day. She is taking baclofen twice a day. Message will be sent to Dr. Pearlean Brownie.

## 2018-07-08 NOTE — Telephone Encounter (Signed)
Pt husband(on DPR) has called stating that he thinks the baclofen 5 MG TABS is causing headaches for the pt.  He is asking for a call from RN

## 2018-07-09 ENCOUNTER — Telehealth: Payer: Self-pay | Admitting: Neurology

## 2018-07-09 NOTE — Telephone Encounter (Signed)
Chart reviewed, per Dr. Pearlean Brownie on May 27 2018  Motor: Spastic right hemiplegia with 2/5 right upper extremity and 4/5 right lower extremity strength.  Significant weakness of right grip and intrinsic hand muscles.mild right foot drop  Tone is increased on the right compared to the left.  Normal strength tone coordination on the left side.    I will see her and inject her at the initial visit, ask for xeomin 500 units.

## 2018-07-09 NOTE — Telephone Encounter (Addendum)
I called pts husband back about baclofen dosage.I stated Dr Pearlean Brownie is fine with the twice a day dosage. He stated pt was taking it in the morning and at dinner time. I explain when a dosage is twice a day it should be taken 12 hours in between. He than stated for the last three days pt is taking it at bedtime only. I stated to call back in 7 to 10 days if her dizzy spells have not subside with the daily dosage. I stated Dr. Pearlean Brownie is aware of this. Pts husband verbalized understanding.

## 2018-07-09 NOTE — Telephone Encounter (Signed)
Just want to confirm this patient's apt on Monday is a consult and not an actual injection?

## 2018-07-09 NOTE — Telephone Encounter (Signed)
Submitted Dr. Marlis Edelson notes to attempt PA with [facility]. Will need a dosage clarification.

## 2018-07-10 NOTE — Telephone Encounter (Signed)
Insurance called and stated that they would not approve Xeomin until the patient has tried dysport. Ok to change to Dysport?

## 2018-07-10 NOTE — Telephone Encounter (Signed)
Yes, Ok to try dysport 500 units x 2

## 2018-07-10 NOTE — Telephone Encounter (Signed)
Noted, thank you

## 2018-07-11 DIAGNOSIS — R69 Illness, unspecified: Secondary | ICD-10-CM | POA: Diagnosis not present

## 2018-07-15 ENCOUNTER — Telehealth: Payer: Self-pay

## 2018-07-15 ENCOUNTER — Telehealth: Payer: Self-pay | Admitting: Neurology

## 2018-07-15 ENCOUNTER — Ambulatory Visit: Payer: Self-pay | Admitting: Neurology

## 2018-07-15 NOTE — Telephone Encounter (Signed)
Her Dysport appt has been rescheduled to 09/02/2018.

## 2018-07-15 NOTE — Telephone Encounter (Signed)
I was unable to reschedule her appointment due to the patient not answering the phone. I left a voicemail asking her to return my call.

## 2018-07-15 NOTE — Telephone Encounter (Signed)
LVM 3/16 

## 2018-07-15 NOTE — Telephone Encounter (Signed)
Patient is scheduled to be seen as a new patient on July 15, 2018, this will be her initial office visit with me, for EMG guided botulism toxin injection,  She was a patient of Dr. Pearlean Brownie with large basal ganglia infarction of cryptogenic in July 2019, with residual spastic right hemiparesis, multiple vascular risk factor of hypertension, diabetes, hyperlipidemia, cerebrovascular disease,  Please advise patient, call our office to reschedule Botox injection later.

## 2018-07-22 NOTE — Telephone Encounter (Signed)
Noted, thank you. DW  °

## 2018-08-02 DIAGNOSIS — I639 Cerebral infarction, unspecified: Secondary | ICD-10-CM | POA: Diagnosis not present

## 2018-08-22 ENCOUNTER — Ambulatory Visit: Payer: Self-pay | Admitting: Neurology

## 2018-08-29 NOTE — Telephone Encounter (Signed)
Her Dysport has been rescheduled to 09/25/2018.

## 2018-09-01 DIAGNOSIS — I639 Cerebral infarction, unspecified: Secondary | ICD-10-CM | POA: Diagnosis not present

## 2018-09-02 ENCOUNTER — Ambulatory Visit: Payer: Self-pay | Admitting: Neurology

## 2018-09-02 NOTE — Telephone Encounter (Signed)
Noted, thank you. DW  °

## 2018-09-18 ENCOUNTER — Telehealth: Payer: Self-pay | Admitting: Neurology

## 2018-09-18 NOTE — Telephone Encounter (Signed)
[id number] (07/10/19) DYSPORT approval. DW

## 2018-09-25 ENCOUNTER — Ambulatory Visit (INDEPENDENT_AMBULATORY_CARE_PROVIDER_SITE_OTHER): Payer: Medicare HMO | Admitting: Neurology

## 2018-09-25 ENCOUNTER — Other Ambulatory Visit: Payer: Self-pay

## 2018-09-25 ENCOUNTER — Telehealth: Payer: Self-pay | Admitting: Neurology

## 2018-09-25 ENCOUNTER — Encounter: Payer: Self-pay | Admitting: Neurology

## 2018-09-25 VITALS — BP 142/60 | HR 72 | Temp 97.3°F

## 2018-09-25 DIAGNOSIS — I639 Cerebral infarction, unspecified: Secondary | ICD-10-CM | POA: Diagnosis not present

## 2018-09-25 DIAGNOSIS — R269 Unspecified abnormalities of gait and mobility: Secondary | ICD-10-CM | POA: Diagnosis not present

## 2018-09-25 DIAGNOSIS — G8111 Spastic hemiplegia affecting right dominant side: Secondary | ICD-10-CM | POA: Diagnosis not present

## 2018-09-25 NOTE — Telephone Encounter (Signed)
dysport f/u inj

## 2018-09-25 NOTE — Progress Notes (Signed)
PATIENT: [name] DOB: 1955/12/15  Chief Complaint  Patient presents with  . Right Spastic Hemifacial    Dysport 500 units x 2 vials - office supply     HISTORICAL  [name] is a 63 year old female, accompanied by her husband, referred by Dr. Leonie Man for evaluation of EMG guided botulism toxin injection for spastic right hemiparesis  I reviewed and summarized previous records, he was admitted to [facility] on November 25, 2017 for right-sided weakness, symptoms started 2 weeks prior to hospital admission with associated slurred speech, personally reviewed CT head and MRI of the brain, 2 cm left basal ganglion subcortical infarction without hemorrhagic transformation, remote lacunar infarction in the right basal ganglia Carotid ultrasound showed mild plaque but no significant extracranial stenosis.   MRA of the brain showed mild atheromatous irregularities in bilateral P2 segments of posterior cerebral arteries only.   LDL cholesterol was elevated at 121 mg percent and hemoglobin A1c was 9.5.   Transthoracic echo showed normal ejection fraction.   Patient subsequently had outpatient 30-day Holter monitor which did not reveal any cardiac arrhythmias.    She currently lives at home with her husband, with severe spastic right hemiparesis, ambulate with hemiwalker, has fixed contraction of right upper extremity, right shoulder pain, elbow pain with passive movement, tendency to hold right elbow flexion, wrist flexion, thumb in position  REVIEW OF SYSTEMS: Full 14 system review of systems performed and notable only for as above All other review of systems were negative.  ALLERGIES: Allergies  Allergen Reactions  . Bee Pollen Anaphylaxis and Swelling  . Bee Venom   . Codeine     REACTION: nauseated,claustrophobic    HOME MEDICATIONS: Current Outpatient Medications  Medication Sig Dispense Refill  . albuterol (PROVENTIL HFA;VENTOLIN HFA) 108 (90 Base) MCG/ACT  inhaler INHALE 2 PUFFS BY MOUTH EVERY 6 HOURS AS NEEDED FOR SHORTNESS OF BREATH OR WHEEZING    . amLODipine (NORVASC) 10 MG tablet Take 10 mg by mouth at bedtime.  2  . aspirin 325 MG tablet Take 1 tablet (325 mg total) by mouth daily.    Marland Kitchen atorvastatin (LIPITOR) 40 MG tablet Take 40 mg by mouth daily.    . baclofen 5 MG TABS Take 5 mg by mouth 2 (two) times daily. Start 1 tablet twice daily x 1 week then three times daily 90 tablet 2  . Blood Glucose Monitoring Suppl (ONETOUCH VERIO) w/Device KIT USE TO CHECK GLUCOSE THREE TIMES DAILY  0  . cloNIDine (CATAPRES) 0.1 MG tablet Take 0.1 mg by mouth 2 (two) times daily.    . diclofenac sodium (VOLTAREN) 1 % GEL Apply 3 g to 3 large joints up to 3 times a day when necessary 3 Tube 3  . fenofibrate 160 MG tablet Take 160 mg by mouth daily.   1  . hydrALAZINE (APRESOLINE) 10 MG tablet Take 10 mg by mouth 2 (two) times daily.  2  . insulin detemir (LEVEMIR) 100 UNIT/ML injection Inject 0.3 mLs (30 Units total) into the skin daily.    Marland Kitchen losartan (COZAAR) 100 MG tablet Take 100 mg by mouth daily.  0  . metFORMIN (GLUCOPHAGE) 1000 MG tablet Take 1 tablet (1,000 mg total) by mouth 2 (two) times daily with a meal.    . olmesartan (BENICAR) 40 MG tablet Take 40 mg by mouth daily.    Marland Kitchen omega-3 acid ethyl esters (LOVAZA) 1 g capsule Take 1 capsule (1 g total) by mouth 2 (two)  times daily.    Glory Rosebush VERIO test strip USE 1 STRIP TO CHECK GLUCOSE THREE TIMES DAILY  3  . simvastatin (ZOCOR) 40 MG tablet TAKE 1 TABLET BY MOUTH ONCE DAILY FOR CHOLESTEROL  2  . SOLIQUA 100-33 UNT-MCG/ML SOPN INJECT 25 UNITS SUBCUTANEOUSLY BEFORE BREAKFAST (USE LESS THAN 1 HOUR BEFORE FIRST MEAL OF THE DAY)  2   No current facility-administered medications for this visit.     PAST MEDICAL HISTORY: Past Medical History:  Diagnosis Date  . Diabetes mellitus without complication ([place])   . Diverticulitis   . Fibromyalgia   . Hyperlipidemia   . Hypertension   . Papule  03/22/2015  . Stroke [facility])     PAST SURGICAL HISTORY: Past Surgical History:  Procedure Laterality Date  . CHOLECYSTECTOMY    . KNEE SURGERY Left     FAMILY HISTORY: Family History  Problem Relation Age of Onset  . Diabetes Mother   . Dementia Mother   . Hypertension Mother   . Stroke Mother   . Alcohol abuse Father   . Cirrhosis Father   . Arthritis Sister        rheumatoid  . Diabetes Brother   . Hypertension Brother   . Diabetes Maternal Grandmother   . Fibromyalgia Sister   . Hypertension Sister   . Fibromyalgia Sister   . Hypertension Sister   . Diabetes Sister   . Hypertension Sister   . Cirrhosis Brother   . Cancer Brother        pancreatic    SOCIAL HISTORY: Social History   Socioeconomic History  . Marital status: Married    Spouse name: Not on file  . Number of children: Not on file  . Years of education: Not on file  . Highest education level: Not on file  Occupational History  . Not on file  Social Needs  . Financial resource strain: Not on file  . Food insecurity:    Worry: Not on file    Inability: Not on file  . Transportation needs:    Medical: Not on file    Non-medical: Not on file  Tobacco Use  . Smoking status: Former Smoker    Packs/day: 1.50    Years: 38.00    Pack years: 57.00    Types: Cigarettes    Last attempt to quit: 03/26/2001    Years since quitting: 17.5  . Smokeless tobacco: Never Used  Substance and Sexual Activity  . Alcohol use: Yes    Alcohol/week: 1.0 standard drinks    Types: 1 Glasses of wine per week    Comment: occ glass of wine  . Drug use: No  . Sexual activity: Never    Birth control/protection: Post-menopausal  Lifestyle  . Physical activity:    Days per week: Not on file    Minutes per session: Not on file  . Stress: Not on file  Relationships  . Social connections:    Talks on phone: Not on file    Gets together: Not on file    Attends religious service: Not on file    Active member of  club or organization: Not on file    Attends meetings of clubs or organizations: Not on file    Relationship status: Not on file  . Intimate partner violence:    Fear of current or ex partner: Not on file    Emotionally abused: Not on file    Physically abused: Not on file    Forced  sexual activity: Not on file  Other Topics Concern  . Not on file  Social History Narrative  . Not on file     PHYSICAL EXAM   Vitals:   09/25/18 0833  BP: (!) 142/60  Pulse: 72  Temp: (!) 97.3 F (36.3 C)    Not recorded      There is no height or weight on file to calculate BMI.  PHYSICAL EXAMNIATION:  Gen: NAD, conversant, well nourised, obese, well groomed                     Cardiovascular: Regular rate rhythm, no peripheral edema, warm, nontender. Eyes: Conjunctivae clear without exudates or hemorrhage Neck: Supple, no carotid bruits. Pulmonary: Clear to auscultation bilaterally   NEUROLOGICAL EXAM:  MENTAL STATUS: Speech:    Speech is normal; fluent and spontaneous with normal comprehension.  Cognition:     Orientation to time, place and person     Normal recent and remote memory     Normal Attention span and concentration     Normal Language, naming, repeating,spontaneous speech     Fund of knowledge   CRANIAL NERVES: CN II: Visual fields are full to confrontation.  Pupils are round equal and briskly reactive to light. CN III, IV, VI: extraocular movement are normal. No ptosis. CN V: Facial sensation is intact to pinprick in all 3 divisions bilaterally. Corneal responses are intact.  CN VII: Face is symmetric with normal eye closure and smile. CN VIII: Hearing is normal to rubbing fingers CN IX, X: Palate elevates symmetrically. Phonation is normal. CN XI: Head turning and shoulder shrug are intact CN XII: Tongue is midline with normal movements and no atrophy.  MOTOR: Spastic right hemiparesis, complains of right shoulder and elbow pain with passive movement, limited  range of motion, antigravity movement of right shoulder extension, tendency to hold right elbow flexion, pronation, wrist flexion, thumb in position, but with passive movement, she has full range of motion of right elbow, right wrist, and right finger, antigravity movement of right lower extremity proximal muscles, tendency for right ankle plantarflexion  REFLEXES: Hyperreflexia on the right side  SENSORY: Intact to light touch  COORDINATION: No dysmetria  GAIT/STANCE: Need assistance to get up from seated position, right hemi-circumferential, spastic unsteady gait  DIAGNOSTIC DATA (LABS, IMAGING, TESTING) - I reviewed patient records, labs, notes, testing and imaging myself where available.   ASSESSMENT AND PLAN  [name] is a 63 y.o. female   Spastic right hemiparesis following left basal ganglia infarction in July 2019  Under EMG guidance, injected disport 500 units x 2 today (500units/2.5cc of NS)  Right pectoralis major 0.5 cc x2 Right brachialis 0.5 ccx2 Right pronator teres 0.5 cc Right flexor digitorum superficialis 0.5 cc  Right flexor digitorum profundus 0.5 cc Right palmaris longus 0.5 cc Right pronator teres 0.5 cc Right latissimus dorsi 0.5 cc       Marcial Pacas, M.D. Ph.D.  [facility] [address] [place], [place] [address] Ph: [phone] Fax: [phone]  CC: Referring Provider

## 2018-09-25 NOTE — Progress Notes (Signed)
**  Dysport 500 units x 2 vials, NDC [address]-9, Lot [id number], Exp 03/01/2019, office supply.//mck,rn** 

## 2018-09-25 NOTE — Telephone Encounter (Signed)
Patient has already been scheduled. DW  °

## 2018-09-27 DIAGNOSIS — I639 Cerebral infarction, unspecified: Secondary | ICD-10-CM | POA: Diagnosis not present

## 2018-09-27 DIAGNOSIS — R269 Unspecified abnormalities of gait and mobility: Secondary | ICD-10-CM | POA: Diagnosis not present

## 2018-09-27 DIAGNOSIS — G8111 Spastic hemiplegia affecting right dominant side: Secondary | ICD-10-CM | POA: Diagnosis not present

## 2018-09-27 MED ORDER — ABOBOTULINUMTOXINA 500 UNITS IM SOLR
1000.0000 [IU] | Freq: Once | INTRAMUSCULAR | Status: AC
  Administered 2018-09-27: 1000 [IU] via INTRAMUSCULAR

## 2018-09-27 NOTE — Addendum Note (Signed)
Addended by: Levert Feinstein on: 09/27/2018 06:43 PM   Modules accepted: Level of Service

## 2018-10-02 DIAGNOSIS — I639 Cerebral infarction, unspecified: Secondary | ICD-10-CM | POA: Diagnosis not present

## 2018-10-02 DIAGNOSIS — I10 Essential (primary) hypertension: Secondary | ICD-10-CM | POA: Diagnosis not present

## 2018-10-02 DIAGNOSIS — E782 Mixed hyperlipidemia: Secondary | ICD-10-CM | POA: Diagnosis not present

## 2018-10-02 DIAGNOSIS — E1165 Type 2 diabetes mellitus with hyperglycemia: Secondary | ICD-10-CM | POA: Diagnosis not present

## 2018-10-02 DIAGNOSIS — R946 Abnormal results of thyroid function studies: Secondary | ICD-10-CM | POA: Diagnosis not present

## 2018-10-09 DIAGNOSIS — Z8673 Personal history of transient ischemic attack (TIA), and cerebral infarction without residual deficits: Secondary | ICD-10-CM | POA: Diagnosis not present

## 2018-10-09 DIAGNOSIS — I10 Essential (primary) hypertension: Secondary | ICD-10-CM | POA: Diagnosis not present

## 2018-10-09 DIAGNOSIS — R69 Illness, unspecified: Secondary | ICD-10-CM | POA: Diagnosis not present

## 2018-10-09 DIAGNOSIS — E039 Hypothyroidism, unspecified: Secondary | ICD-10-CM | POA: Diagnosis not present

## 2018-10-09 DIAGNOSIS — M2450 Contracture, unspecified joint: Secondary | ICD-10-CM | POA: Diagnosis not present

## 2018-10-09 DIAGNOSIS — E1169 Type 2 diabetes mellitus with other specified complication: Secondary | ICD-10-CM | POA: Diagnosis not present

## 2018-10-09 DIAGNOSIS — E782 Mixed hyperlipidemia: Secondary | ICD-10-CM | POA: Diagnosis not present

## 2018-10-14 DIAGNOSIS — R69 Illness, unspecified: Secondary | ICD-10-CM | POA: Diagnosis not present

## 2018-10-15 DIAGNOSIS — R69 Illness, unspecified: Secondary | ICD-10-CM | POA: Diagnosis not present

## 2018-11-06 ENCOUNTER — Encounter (HOSPITAL_COMMUNITY): Payer: Self-pay | Admitting: Occupational Therapy

## 2018-11-06 NOTE — Therapy (Signed)
[place], [place], [address] Phone: [phone]   Fax:  [phone]  Patient Details  Name: [name] MRN: [id number] Date of Birth: 1955/11/20 Referring Provider:  No ref. provider found  Encounter Date: 11/06/2018   OCCUPATIONAL THERAPY DISCHARGE SUMMARY  Visits from Start of Care: 11  Current functional level related to goals / functional outcomes: Unknown. Therapy was held on 04/16/2018 due to severe RUE pain limiting progress. Pt was following up with MD regarding potential botox treatments, per chart review pt had first botox injection in 08/2018. Pt has returned to therapy since 04/16/2018 and new referral will be required if pt wants to return in the future.     Remaining deficits: Right spastic hemiplegia    Education / Equipment: HEP Plan: Patient agrees to discharge.  Patient goals were not met. Patient is being discharged due to lack of progress.  ?????      Guadelupe Sabin, OTR/L  [phone] 11/06/2018, 10:48 AM  [place], [place], [address] Phone: [phone]   Fax:  [phone]

## 2018-12-31 ENCOUNTER — Telehealth: Payer: Self-pay | Admitting: Neurology

## 2018-12-31 NOTE — Telephone Encounter (Signed)
Pt requesting a call to r/s injection appt, requesting that someone call her Friday or Monday- pt made aware we will be closed both days and the coordinator can call Tuesday 9/8

## 2019-01-01 ENCOUNTER — Ambulatory Visit: Payer: Medicare HMO | Admitting: Neurology

## 2019-01-07 NOTE — Telephone Encounter (Signed)
I called and spoke with a man at the number provided, he asked that I call back at 10. DW

## 2019-01-08 NOTE — Telephone Encounter (Signed)
I called to schedule the patient but she was not home, someone in the home took a message for her to call our office.

## 2019-01-17 DIAGNOSIS — E782 Mixed hyperlipidemia: Secondary | ICD-10-CM | POA: Diagnosis not present

## 2019-01-17 DIAGNOSIS — E1169 Type 2 diabetes mellitus with other specified complication: Secondary | ICD-10-CM | POA: Diagnosis not present

## 2019-01-17 DIAGNOSIS — E039 Hypothyroidism, unspecified: Secondary | ICD-10-CM | POA: Diagnosis not present

## 2019-01-17 DIAGNOSIS — I10 Essential (primary) hypertension: Secondary | ICD-10-CM | POA: Diagnosis not present

## 2019-01-17 DIAGNOSIS — E1165 Type 2 diabetes mellitus with hyperglycemia: Secondary | ICD-10-CM | POA: Diagnosis not present

## 2019-01-17 DIAGNOSIS — Z23 Encounter for immunization: Secondary | ICD-10-CM | POA: Diagnosis not present

## 2019-01-22 DIAGNOSIS — Z8673 Personal history of transient ischemic attack (TIA), and cerebral infarction without residual deficits: Secondary | ICD-10-CM | POA: Diagnosis not present

## 2019-01-22 DIAGNOSIS — R21 Rash and other nonspecific skin eruption: Secondary | ICD-10-CM | POA: Diagnosis not present

## 2019-01-22 DIAGNOSIS — R944 Abnormal results of kidney function studies: Secondary | ICD-10-CM | POA: Diagnosis not present

## 2019-01-22 DIAGNOSIS — R69 Illness, unspecified: Secondary | ICD-10-CM | POA: Diagnosis not present

## 2019-01-22 DIAGNOSIS — E782 Mixed hyperlipidemia: Secondary | ICD-10-CM | POA: Diagnosis not present

## 2019-01-22 DIAGNOSIS — E039 Hypothyroidism, unspecified: Secondary | ICD-10-CM | POA: Diagnosis not present

## 2019-01-22 DIAGNOSIS — G4700 Insomnia, unspecified: Secondary | ICD-10-CM | POA: Diagnosis not present

## 2019-01-22 DIAGNOSIS — I10 Essential (primary) hypertension: Secondary | ICD-10-CM | POA: Diagnosis not present

## 2019-01-22 DIAGNOSIS — E1169 Type 2 diabetes mellitus with other specified complication: Secondary | ICD-10-CM | POA: Diagnosis not present

## 2019-01-22 DIAGNOSIS — M2450 Contracture, unspecified joint: Secondary | ICD-10-CM | POA: Diagnosis not present

## 2019-02-12 DIAGNOSIS — I10 Essential (primary) hypertension: Secondary | ICD-10-CM | POA: Diagnosis not present

## 2019-02-12 DIAGNOSIS — R944 Abnormal results of kidney function studies: Secondary | ICD-10-CM | POA: Diagnosis not present

## 2019-02-12 DIAGNOSIS — H814 Vertigo of central origin: Secondary | ICD-10-CM | POA: Diagnosis not present

## 2019-02-12 DIAGNOSIS — R21 Rash and other nonspecific skin eruption: Secondary | ICD-10-CM | POA: Diagnosis not present

## 2019-02-12 DIAGNOSIS — J309 Allergic rhinitis, unspecified: Secondary | ICD-10-CM | POA: Diagnosis not present

## 2019-02-12 DIAGNOSIS — R946 Abnormal results of thyroid function studies: Secondary | ICD-10-CM | POA: Diagnosis not present

## 2019-02-12 DIAGNOSIS — E039 Hypothyroidism, unspecified: Secondary | ICD-10-CM | POA: Diagnosis not present

## 2019-02-12 DIAGNOSIS — M62838 Other muscle spasm: Secondary | ICD-10-CM | POA: Diagnosis not present

## 2019-02-12 DIAGNOSIS — E1165 Type 2 diabetes mellitus with hyperglycemia: Secondary | ICD-10-CM | POA: Diagnosis not present

## 2019-02-12 DIAGNOSIS — G4700 Insomnia, unspecified: Secondary | ICD-10-CM | POA: Diagnosis not present

## 2019-02-12 DIAGNOSIS — E782 Mixed hyperlipidemia: Secondary | ICD-10-CM | POA: Diagnosis not present

## 2019-05-20 ENCOUNTER — Encounter: Payer: Self-pay | Admitting: Gastroenterology

## 2019-05-23 DIAGNOSIS — E782 Mixed hyperlipidemia: Secondary | ICD-10-CM | POA: Diagnosis not present

## 2019-05-23 DIAGNOSIS — I10 Essential (primary) hypertension: Secondary | ICD-10-CM | POA: Diagnosis not present

## 2019-05-23 DIAGNOSIS — E039 Hypothyroidism, unspecified: Secondary | ICD-10-CM | POA: Diagnosis not present

## 2019-05-23 DIAGNOSIS — E1169 Type 2 diabetes mellitus with other specified complication: Secondary | ICD-10-CM | POA: Diagnosis not present

## 2019-05-23 DIAGNOSIS — E1165 Type 2 diabetes mellitus with hyperglycemia: Secondary | ICD-10-CM | POA: Diagnosis not present

## 2019-05-28 DIAGNOSIS — R944 Abnormal results of kidney function studies: Secondary | ICD-10-CM | POA: Diagnosis not present

## 2019-05-28 DIAGNOSIS — R69 Illness, unspecified: Secondary | ICD-10-CM | POA: Diagnosis not present

## 2019-05-28 DIAGNOSIS — I10 Essential (primary) hypertension: Secondary | ICD-10-CM | POA: Diagnosis not present

## 2019-05-28 DIAGNOSIS — Z8673 Personal history of transient ischemic attack (TIA), and cerebral infarction without residual deficits: Secondary | ICD-10-CM | POA: Diagnosis not present

## 2019-05-28 DIAGNOSIS — M2450 Contracture, unspecified joint: Secondary | ICD-10-CM | POA: Diagnosis not present

## 2019-05-28 DIAGNOSIS — E039 Hypothyroidism, unspecified: Secondary | ICD-10-CM | POA: Diagnosis not present

## 2019-05-28 DIAGNOSIS — E782 Mixed hyperlipidemia: Secondary | ICD-10-CM | POA: Diagnosis not present

## 2019-05-28 DIAGNOSIS — G4700 Insomnia, unspecified: Secondary | ICD-10-CM | POA: Diagnosis not present

## 2019-05-28 DIAGNOSIS — G4762 Sleep related leg cramps: Secondary | ICD-10-CM | POA: Diagnosis not present

## 2019-05-28 DIAGNOSIS — E1169 Type 2 diabetes mellitus with other specified complication: Secondary | ICD-10-CM | POA: Diagnosis not present

## 2019-06-26 ENCOUNTER — Other Ambulatory Visit: Payer: Self-pay | Admitting: Physician Assistant

## 2019-06-30 ENCOUNTER — Other Ambulatory Visit: Payer: Self-pay | Admitting: Physician Assistant

## 2019-06-30 DIAGNOSIS — J309 Allergic rhinitis, unspecified: Secondary | ICD-10-CM | POA: Diagnosis not present

## 2019-06-30 DIAGNOSIS — H814 Vertigo of central origin: Secondary | ICD-10-CM | POA: Diagnosis not present

## 2019-06-30 DIAGNOSIS — G4762 Sleep related leg cramps: Secondary | ICD-10-CM | POA: Diagnosis not present

## 2019-06-30 DIAGNOSIS — R21 Rash and other nonspecific skin eruption: Secondary | ICD-10-CM | POA: Diagnosis not present

## 2019-06-30 DIAGNOSIS — Z8673 Personal history of transient ischemic attack (TIA), and cerebral infarction without residual deficits: Secondary | ICD-10-CM | POA: Diagnosis not present

## 2019-06-30 DIAGNOSIS — M62838 Other muscle spasm: Secondary | ICD-10-CM | POA: Diagnosis not present

## 2019-06-30 DIAGNOSIS — G4700 Insomnia, unspecified: Secondary | ICD-10-CM | POA: Diagnosis not present

## 2019-06-30 DIAGNOSIS — R944 Abnormal results of kidney function studies: Secondary | ICD-10-CM | POA: Diagnosis not present

## 2019-06-30 DIAGNOSIS — E782 Mixed hyperlipidemia: Secondary | ICD-10-CM | POA: Diagnosis not present

## 2019-06-30 DIAGNOSIS — M179 Osteoarthritis of knee, unspecified: Secondary | ICD-10-CM | POA: Diagnosis not present

## 2019-06-30 DIAGNOSIS — E1165 Type 2 diabetes mellitus with hyperglycemia: Secondary | ICD-10-CM | POA: Diagnosis not present

## 2019-06-30 DIAGNOSIS — I10 Essential (primary) hypertension: Secondary | ICD-10-CM | POA: Diagnosis not present

## 2019-08-15 DIAGNOSIS — I129 Hypertensive chronic kidney disease with stage 1 through stage 4 chronic kidney disease, or unspecified chronic kidney disease: Secondary | ICD-10-CM | POA: Diagnosis not present

## 2019-08-15 DIAGNOSIS — N1831 Chronic kidney disease, stage 3a: Secondary | ICD-10-CM | POA: Diagnosis not present

## 2019-08-30 IMAGING — US US EXTREM LOW VENOUS*R*
1 series · 13 of 24 positions shown · non-contrast
Comparison: None.

CLINICAL DATA: 62-year-old with right foot edema after injury 5
days ago.



[Series 1: us extrem low venous*right* · 0.08mm/px · 13 of 38 slices shown]
[im 1/38]
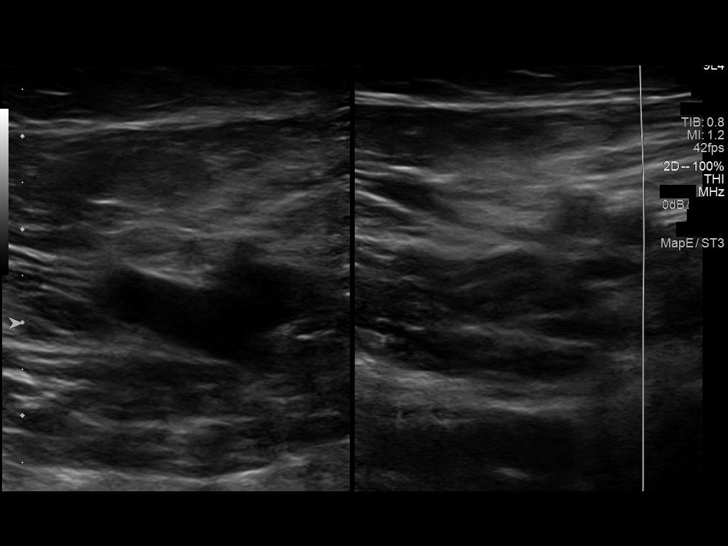
[im 4/38]
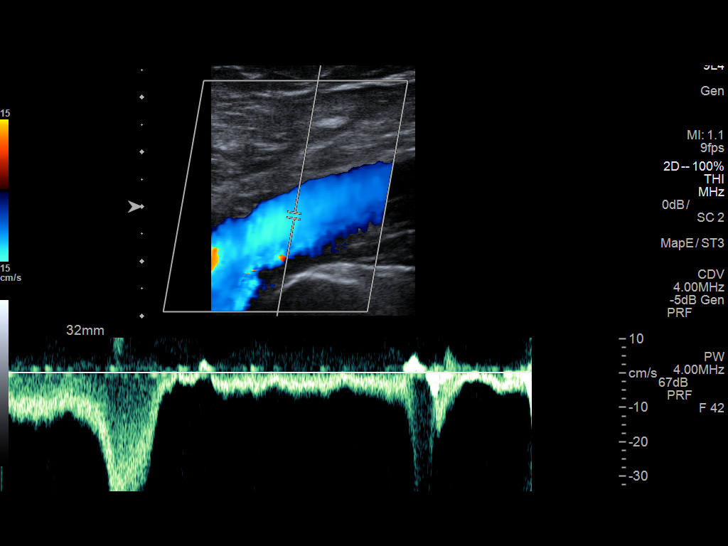
[im 7/38]
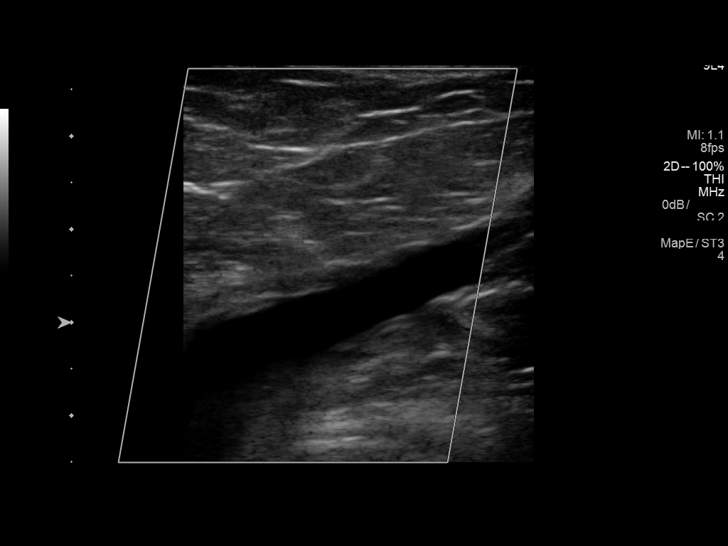
[im 10/38]
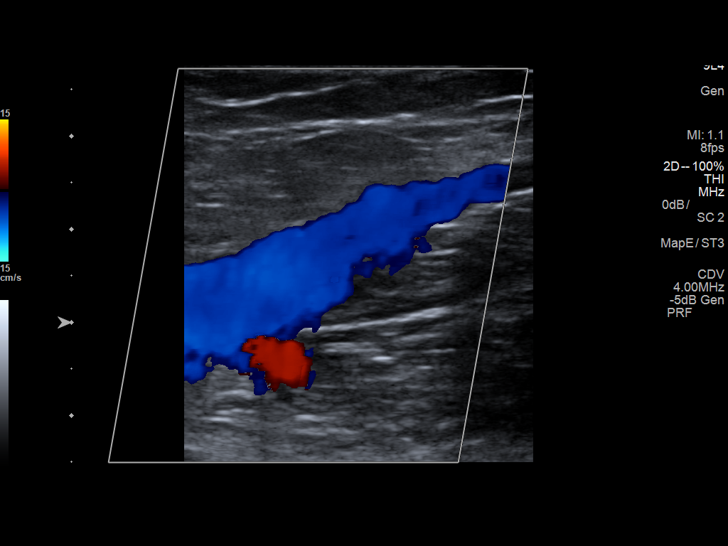
[im 13/38]
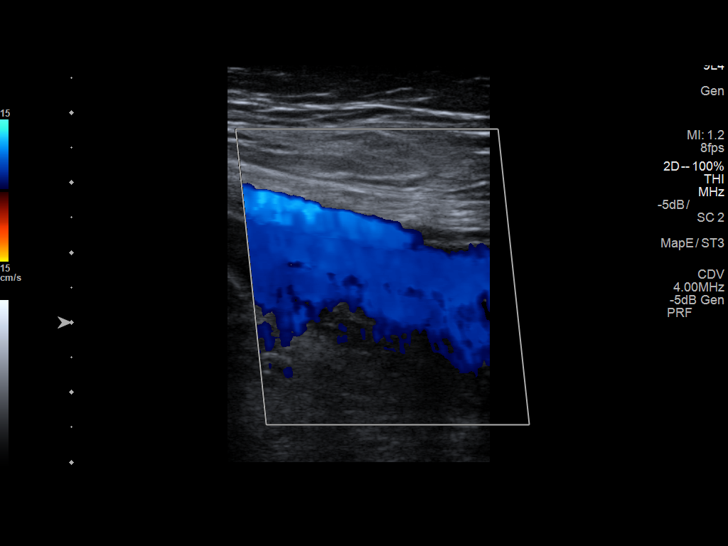
[im 17/38]
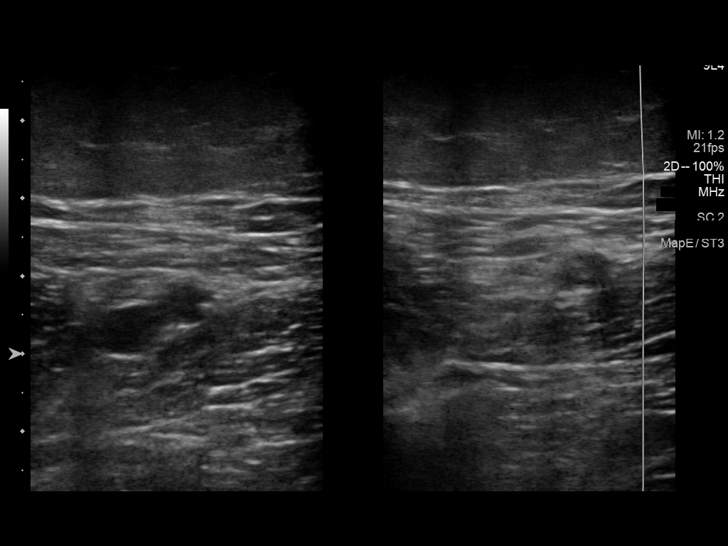
[im 20/38]
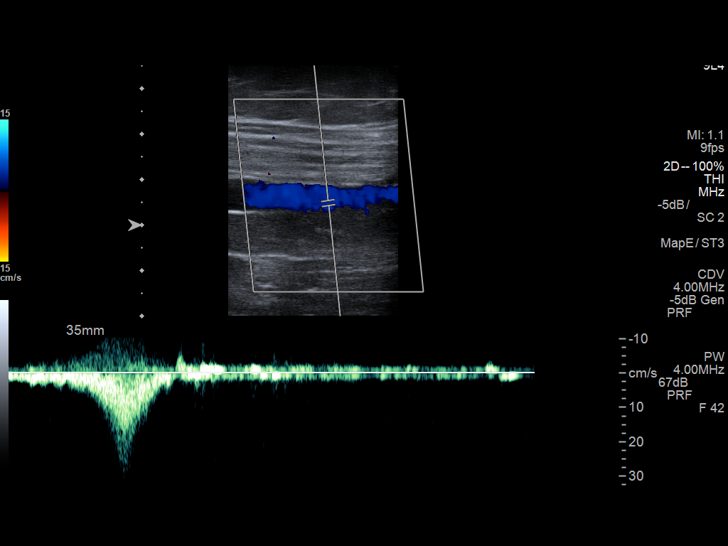
[im 21/38]
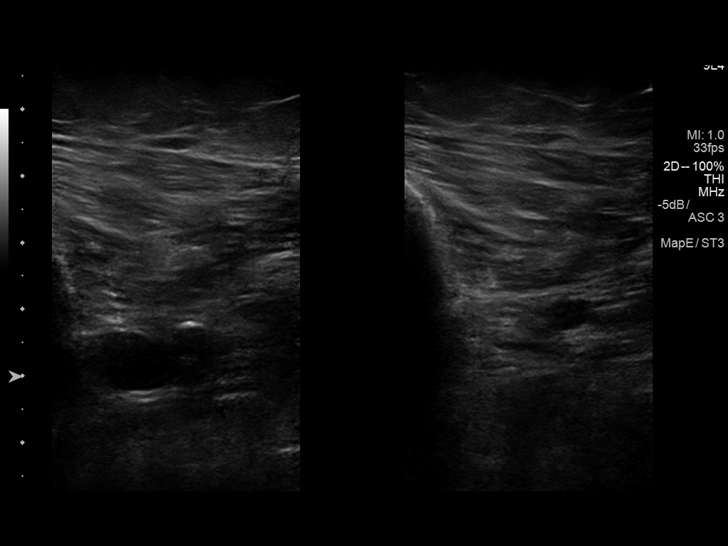
[im 25/38]
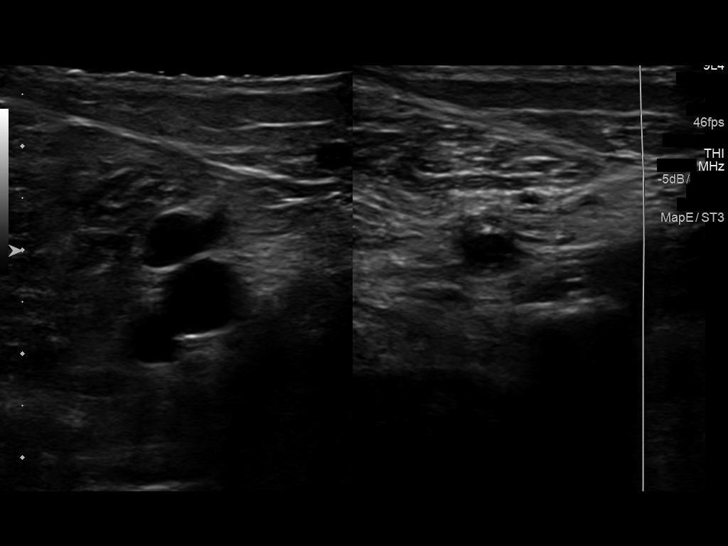
[im 28/38]
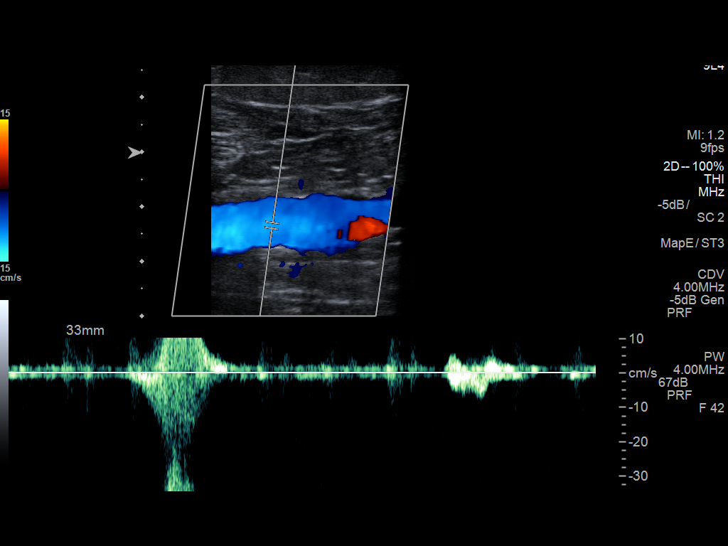
[im 31/38]
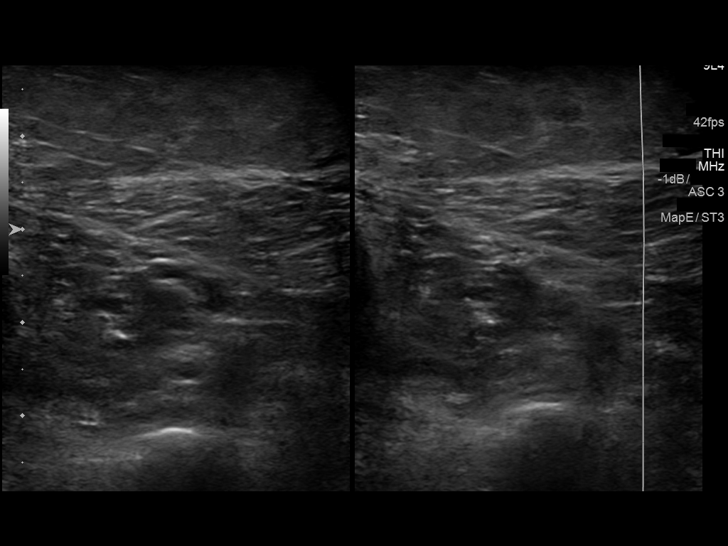
[im 34/38]
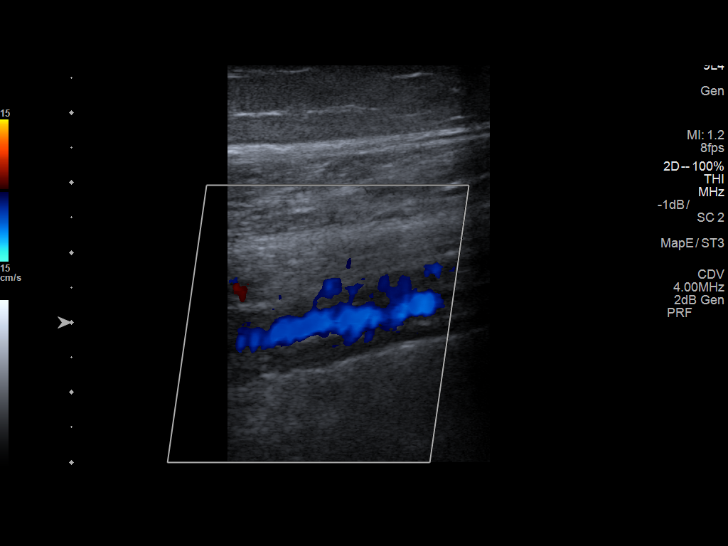
[im 38/38]
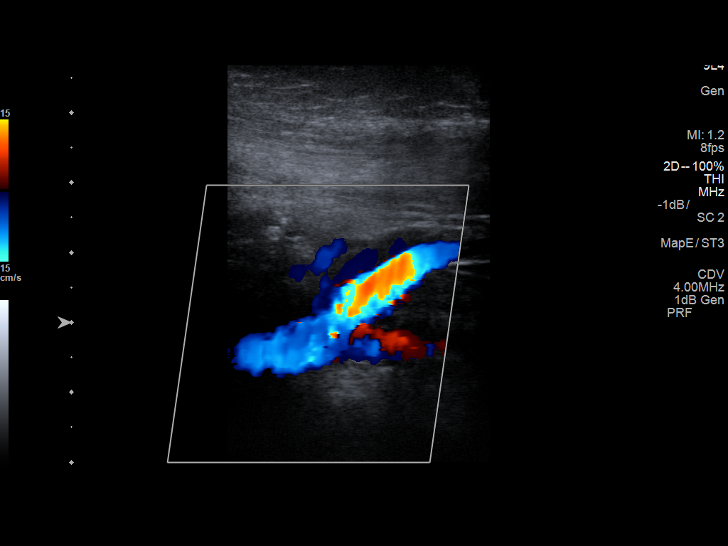

[13 of 24 positions shown; findings below may reference images not displayed]

FINDINGS: Contralateral Common Femoral Vein: Respiratory phasicity is normal
and symmetric with the symptomatic side. No evidence of thrombus.
Normal compressibility.

Common Femoral Vein: No evidence of thrombus. Normal
compressibility, respiratory phasicity and response to augmentation.

Saphenofemoral Junction: No evidence of thrombus. Normal
compressibility and flow on color Doppler imaging.

Profunda Femoral Vein: No evidence of thrombus. Normal
compressibility and flow on color Doppler imaging.

Femoral Vein: No evidence of thrombus. Normal compressibility,
respiratory phasicity and response to augmentation.

Popliteal Vein: No evidence of thrombus. Normal compressibility,
respiratory phasicity and response to augmentation.

Calf Veins: No evidence of thrombus. Normal compressibility and flow
on color Doppler imaging.

Superficial Great Saphenous Vein: No evidence of thrombus. Normal
compressibility.

Venous Reflux:  None.

Other Findings:  None.
IMPRESSION: Negative for deep venous thrombosis in right lower extremity.

## 2019-09-03 DIAGNOSIS — I120 Hypertensive chronic kidney disease with stage 5 chronic kidney disease or end stage renal disease: Secondary | ICD-10-CM | POA: Diagnosis not present

## 2019-09-03 DIAGNOSIS — E782 Mixed hyperlipidemia: Secondary | ICD-10-CM | POA: Diagnosis not present

## 2019-09-03 DIAGNOSIS — E1169 Type 2 diabetes mellitus with other specified complication: Secondary | ICD-10-CM | POA: Diagnosis not present

## 2019-09-03 DIAGNOSIS — E1165 Type 2 diabetes mellitus with hyperglycemia: Secondary | ICD-10-CM | POA: Diagnosis not present

## 2019-09-03 DIAGNOSIS — I10 Essential (primary) hypertension: Secondary | ICD-10-CM | POA: Diagnosis not present

## 2019-09-03 DIAGNOSIS — E039 Hypothyroidism, unspecified: Secondary | ICD-10-CM | POA: Diagnosis not present

## 2019-09-03 DIAGNOSIS — G4700 Insomnia, unspecified: Secondary | ICD-10-CM | POA: Diagnosis not present

## 2019-09-03 DIAGNOSIS — R69 Illness, unspecified: Secondary | ICD-10-CM | POA: Diagnosis not present

## 2019-09-03 DIAGNOSIS — H814 Vertigo of central origin: Secondary | ICD-10-CM | POA: Diagnosis not present

## 2019-09-03 DIAGNOSIS — G4762 Sleep related leg cramps: Secondary | ICD-10-CM | POA: Diagnosis not present

## 2019-09-08 DIAGNOSIS — E782 Mixed hyperlipidemia: Secondary | ICD-10-CM | POA: Diagnosis not present

## 2019-09-08 DIAGNOSIS — I10 Essential (primary) hypertension: Secondary | ICD-10-CM | POA: Diagnosis not present

## 2019-09-08 DIAGNOSIS — E039 Hypothyroidism, unspecified: Secondary | ICD-10-CM | POA: Diagnosis not present

## 2019-09-08 DIAGNOSIS — R69 Illness, unspecified: Secondary | ICD-10-CM | POA: Diagnosis not present

## 2019-09-08 DIAGNOSIS — M2450 Contracture, unspecified joint: Secondary | ICD-10-CM | POA: Diagnosis not present

## 2019-09-08 DIAGNOSIS — Z8673 Personal history of transient ischemic attack (TIA), and cerebral infarction without residual deficits: Secondary | ICD-10-CM | POA: Diagnosis not present

## 2019-09-08 DIAGNOSIS — Z0001 Encounter for general adult medical examination with abnormal findings: Secondary | ICD-10-CM | POA: Diagnosis not present

## 2019-09-08 DIAGNOSIS — G4700 Insomnia, unspecified: Secondary | ICD-10-CM | POA: Diagnosis not present

## 2019-09-08 DIAGNOSIS — R944 Abnormal results of kidney function studies: Secondary | ICD-10-CM | POA: Diagnosis not present

## 2019-09-08 DIAGNOSIS — E1169 Type 2 diabetes mellitus with other specified complication: Secondary | ICD-10-CM | POA: Diagnosis not present

## 2019-11-14 ENCOUNTER — Emergency Department (HOSPITAL_COMMUNITY): Payer: Medicare HMO

## 2019-11-14 ENCOUNTER — Other Ambulatory Visit: Payer: Self-pay

## 2019-11-14 ENCOUNTER — Emergency Department (HOSPITAL_COMMUNITY)
Admission: EM | Admit: 2019-11-14 | Discharge: 2019-11-14 | Disposition: A | Discharge status: Home / Self Care | Payer: Medicare HMO | Attending: Emergency Medicine | Admitting: Emergency Medicine

## 2019-11-14 ENCOUNTER — Encounter (HOSPITAL_COMMUNITY): Payer: Self-pay | Admitting: Emergency Medicine

## 2019-11-14 DIAGNOSIS — M25562 Pain in left knee: Secondary | ICD-10-CM | POA: Insufficient documentation

## 2019-11-14 DIAGNOSIS — E119 Type 2 diabetes mellitus without complications: Secondary | ICD-10-CM | POA: Insufficient documentation

## 2019-11-14 DIAGNOSIS — Y999 Unspecified external cause status: Secondary | ICD-10-CM | POA: Insufficient documentation

## 2019-11-14 DIAGNOSIS — I10 Essential (primary) hypertension: Secondary | ICD-10-CM | POA: Diagnosis not present

## 2019-11-14 DIAGNOSIS — Z7982 Long term (current) use of aspirin: Secondary | ICD-10-CM | POA: Insufficient documentation

## 2019-11-14 DIAGNOSIS — Y939 Activity, unspecified: Secondary | ICD-10-CM | POA: Insufficient documentation

## 2019-11-14 DIAGNOSIS — Z87891 Personal history of nicotine dependence: Secondary | ICD-10-CM | POA: Insufficient documentation

## 2019-11-14 DIAGNOSIS — Y929 Unspecified place or not applicable: Secondary | ICD-10-CM | POA: Diagnosis not present

## 2019-11-14 DIAGNOSIS — S3992XA Unspecified injury of lower back, initial encounter: Secondary | ICD-10-CM | POA: Diagnosis not present

## 2019-11-14 DIAGNOSIS — W01198A Fall on same level from slipping, tripping and stumbling with subsequent striking against other object, initial encounter: Secondary | ICD-10-CM | POA: Insufficient documentation

## 2019-11-14 DIAGNOSIS — E039 Hypothyroidism, unspecified: Secondary | ICD-10-CM | POA: Insufficient documentation

## 2019-11-14 DIAGNOSIS — Z79899 Other long term (current) drug therapy: Secondary | ICD-10-CM | POA: Diagnosis not present

## 2019-11-14 DIAGNOSIS — M533 Sacrococcygeal disorders, not elsewhere classified: Secondary | ICD-10-CM | POA: Insufficient documentation

## 2019-11-14 DIAGNOSIS — G8191 Hemiplegia, unspecified affecting right dominant side: Secondary | ICD-10-CM | POA: Diagnosis not present

## 2019-11-14 DIAGNOSIS — W19XXXA Unspecified fall, initial encounter: Secondary | ICD-10-CM

## 2019-11-14 DIAGNOSIS — S8992XA Unspecified injury of left lower leg, initial encounter: Secondary | ICD-10-CM | POA: Diagnosis not present

## 2019-11-14 MED ORDER — HYDROCODONE-ACETAMINOPHEN 5-325 MG PO TABS
1.0000 | ORAL_TABLET | Freq: Once | ORAL | Status: AC
  Administered 2019-11-14: 1 via ORAL
  Filled 2019-11-14: qty 1

## 2019-11-14 MED ORDER — ONDANSETRON 4 MG PO TBDP
4.0000 mg | ORAL_TABLET | Freq: Once | ORAL | Status: AC
  Administered 2019-11-14: 4 mg via ORAL
  Filled 2019-11-14: qty 1

## 2019-11-14 MED ORDER — LIDOCAINE 5 % EX PTCH
1.0000 | MEDICATED_PATCH | CUTANEOUS | Status: DC
  Administered 2019-11-14: 1 via TRANSDERMAL
  Filled 2019-11-14: qty 1

## 2019-11-14 MED ORDER — LIDOCAINE 5 % EX PTCH: 1.0000 | MEDICATED_PATCH | Freq: Every day | CUTANEOUS | 0 refills | Status: AC | PRN

## 2019-11-14 NOTE — Discharge Instructions (Addendum)
Please read and follow all provided instructions.  You have been seen today for a fall with pain to your tailbone  Tests performed today include: X-rays of your tailbone and your left knee did not show any fractures. Vital signs. See below for your results today.   Please apply ice wrapped in a towel to your tailbone area as well as your knee 20 minutes on 40 minutes off for the next 24 hours.  Please keep your knee elevated.  Please try to rest. Medications:  We have prescribed you Lidoderm patches, please apply 1 patch to your tailbone area once per day as needed for pain.  You may take Tylenol in addition to this.  We have prescribed you new medication(s) today. Discuss the medications prescribed today with your pharmacist as they can have adverse effects and interactions with your other medicines including over the counter and prescribed medications. Seek medical evaluation if you start to experience new or abnormal symptoms after taking one of these medicines, seek care immediately if you start to experience difficulty breathing, feeling of your throat closing, facial swelling, or rash as these could be indications of a more serious allergic reaction   Follow-up instructions: Please follow-up with your primary care provider or the provided orthopedic physician (bone specialist) within 3 to 5 days.  Return instructions:  Please return if your digits or extremity are numb or tingling, appear gray or blue,  Please return if you have worsening pain Please return for chest or abdominal pain Please return if you have redness or fevers.  Please return to the Emergency Department if you experience worsening symptoms.  Please return if you have any other emergent concerns. Additional Information:  Your vital signs today were: BP (!) 192/68 (BP Location: Left Arm)   Pulse 68   Temp 98.1 F (36.7 C) (Oral)   Resp 18   Ht 5\' 3"  (1.6 m)   Wt 78.9 kg   SpO2 96%   BMI 30.82 kg/m  If your  blood pressure (BP) was elevated above 135/85 this visit, please have this repeated by your doctor within one month. ---------------

## 2019-11-14 NOTE — ED Triage Notes (Signed)
Pt reports fell yesterday while flushing toilet. Pt reports "I believe I lost my balance and fell on my butt and hit my head." pt denies loc. Pt alert and oriented. Pt reports right sided weakness from past stroke. nad noted. Pt denies being on any blood thinners.

## 2019-11-14 NOTE — ED Provider Notes (Signed)
[facility] EMERGENCY DEPARTMENT Provider Note   CSN: [id number] Arrival date & time: 11/14/19  1227    History Chief Complaint  Patient presents with  . Fall    [name] is a 64 y.o. female with a history of hypertension, hyperlipidemia, diabetes mellitus, renal insufficiency, and prior CVA with right spastic hemiplegia who presents to the emergency department status post fall yesterday with complaints of pain in the tailbone area. Patient states that she had gotten off of the commode and as she bent forward to flush the toilet she lost her balance and fell onto her bottom. She states she slid down the wall, she did hit her head but fairly gently on the wall and had no LOC. She needed assistance to get up. She has ambulated S/p fall with her walker that she uses at baseline. She complains of pain only to the tailbone region that is constant, worse with movement & no alleviating factors. She denies any acute neurologic sxs, has baseline R sided hemiplegia. She denies headache, neck pain, chest pain, dyspnea, abdominal pain, vomiting, seizure activity, or syncope.   HPI     Past Medical History:  Diagnosis Date  . Diabetes mellitus without complication ([place])   . Diverticulitis   . Fibromyalgia   . Hyperlipidemia   . Hypertension   . Papule 03/22/2015  . Stroke [facility])     Patient Active Problem List   Diagnosis Date Noted  . Right spastic hemiplegia ([place]) 05/28/2018  . Hypothyroidism 11/29/2017  . Cerebrovascular accident (CVA) ([place])   . Ischemic stroke ([place])   . Type 2 diabetes mellitus with hyperglycemia ([place]) 11/25/2017  . HLD (hyperlipidemia) 11/25/2017  . Fibromyalgia 11/25/2017  . Depression 11/25/2017  . Tobacco abuse 11/25/2017  . Papule 03/22/2015  . Hypertension   . Acute diverticulitis 11/08/2014  . Diabetes ([place]) 11/08/2014  . Renal insufficiency 11/08/2014  . Abdominal pain 11/08/2014  . Left leg weakness 04/10/2011  . Abnormal gait 04/10/2011  .  S/P total knee replacement 12/28/2010  . Knee pain 12/28/2010    Past Surgical History:  Procedure Laterality Date  . CHOLECYSTECTOMY    . KNEE SURGERY Left      OB History    Gravida  0   Para  0   Term  0   Preterm  0   AB  0   Living  0     SAB  0   TAB  0   Ectopic  0   Multiple  0   Live Births              Family History  Problem Relation Age of Onset  . Diabetes Mother   . Dementia Mother   . Hypertension Mother   . Stroke Mother   . Alcohol abuse Father   . Cirrhosis Father   . Arthritis/Rheumatoid Sister   . Diabetes Brother   . Hypertension Brother   . Diabetes Maternal Grandmother   . Fibromyalgia Sister   . Hypertension Sister   . Fibromyalgia Sister   . Hypertension Sister   . Diabetes Sister   . Hypertension Sister   . Cirrhosis Brother   . Pancreatic cancer Brother     Social History   Tobacco Use  . Smoking status: Former Smoker    Packs/day: 1.50    Years: 38.00    Pack years: 57.00    Types: Cigarettes    Quit date: 03/26/2001    Years since quitting: 18.6  .  Smokeless tobacco: Never Used  Vaping Use  . Vaping Use: Never used  Substance Use Topics  . Alcohol use: Yes    Alcohol/week: 1.0 standard drink    Types: 1 Glasses of wine per week    Comment: occ glass of wine  . Drug use: No    Home Medications Prior to Admission medications   Medication Sig Start Date End Date Taking? Authorizing Provider  albuterol (PROVENTIL HFA;VENTOLIN HFA) 108 (90 Base) MCG/ACT inhaler INHALE 2 PUFFS BY MOUTH EVERY 6 HOURS AS NEEDED FOR SHORTNESS OF BREATH OR WHEEZING 04/18/18   [provider]  amLODipine (NORVASC) 10 MG tablet Take 10 mg by mouth at bedtime. 03/01/18   [provider]  aspirin 325 MG tablet Take 1 tablet (325 mg total) by mouth daily. 11/28/17   Barton Dubois, MD  atorvastatin (LIPITOR) 40 MG tablet Take 40 mg by mouth daily. 05/05/18   [provider]  baclofen 5 MG TABS Take 5 mg by  mouth 2 (two) times daily. Start 1 tablet twice daily x 1 week then three times daily 05/27/18   Garvin Fila, MD  Blood Glucose Monitoring Suppl (ONETOUCH VERIO) w/Device KIT USE TO CHECK GLUCOSE THREE TIMES DAILY 11/22/17   [provider]  cloNIDine (CATAPRES) 0.1 MG tablet Take 0.1 mg by mouth 2 (two) times daily. 04/25/18   [provider]  diclofenac sodium (VOLTAREN) 1 % GEL Apply 3 g to 3 large joints up to 3 times a day when necessary 02/27/18   Ofilia Neas, PA-C  fenofibrate 160 MG tablet Take 160 mg by mouth daily.  11/06/14   [provider]  hydrALAZINE (APRESOLINE) 10 MG tablet Take 10 mg by mouth 2 (two) times daily. 04/05/18   [provider]  insulin detemir (LEVEMIR) 100 UNIT/ML injection Inject 0.3 mLs (30 Units total) into the skin daily. 11/28/17   Barton Dubois, MD  losartan (COZAAR) 100 MG tablet Take 100 mg by mouth daily. 01/16/18   [provider]  metFORMIN (GLUCOPHAGE) 1000 MG tablet Take 1 tablet (1,000 mg total) by mouth 2 (two) times daily with a meal. 11/27/17 11/27/18  Barton Dubois, MD  olmesartan (BENICAR) 40 MG tablet Take 40 mg by mouth daily. 04/27/18   [provider]  omega-3 acid ethyl esters (LOVAZA) 1 g capsule Take 1 capsule (1 g total) by mouth 2 (two) times daily. 11/27/17   Barton Dubois, MD  ONETOUCH VERIO test strip USE 1 STRIP TO [place] DAILY 11/23/17   [provider]  simvastatin (ZOCOR) 40 MG tablet TAKE 1 TABLET BY MOUTH ONCE DAILY FOR CHOLESTEROL 01/30/18   [provider]  [place] 100-33 UNT-MCG/ML SOPN INJECT 25 UNITS SUBCUTANEOUSLY BEFORE BREAKFAST (USE LESS THAN 1 HOUR BEFORE FIRST MEAL OF THE DAY) 03/22/18   [provider]    Allergies    Bee pollen, Bee venom, and Codeine  Review of Systems   Review of Systems  Constitutional: Negative for chills and fever.  Eyes: Negative for visual disturbance.  Respiratory: Negative for shortness of  breath.   Cardiovascular: Negative for chest pain.  Gastrointestinal: Negative for abdominal pain and vomiting.  Musculoskeletal: Positive for back pain (tailbone). Negative for neck pain.  Neurological: Negative for seizures, syncope, facial asymmetry, speech difficulty, weakness (Acute) and headaches.  All other systems reviewed and are negative.   Physical Exam Updated Vital Signs BP (!) 192/68 (BP Location: Left Arm)   Pulse 68   Temp  98.1 F (36.7 C) (Oral)   Resp 18   Ht 5' 3"  (1.6 m)   Wt 78.9 kg   SpO2 96%   BMI 30.82 kg/m   Physical Exam Vitals and nursing note reviewed.  Constitutional:      General: She is not in acute distress.    Appearance: She is well-developed.  HENT:     Head: Normocephalic and atraumatic. No raccoon eyes or Battle's sign.     Right Ear: No hemotympanum.     Left Ear: No hemotympanum.  Eyes:     General:        Right eye: No discharge.        Left eye: No discharge.     Extraocular Movements: Extraocular movements intact.     Conjunctiva/sclera: Conjunctivae normal.     Pupils: Pupils are equal, round, and reactive to light.  Cardiovascular:     Rate and Rhythm: Normal rate and regular rhythm.     Heart sounds: No murmur heard.      Comments: 2+ symmetric DP pulses. Pulmonary:     Effort: No respiratory distress.     Breath sounds: Normal breath sounds. No wheezing or rales.  Chest:     Chest wall: No tenderness.  Abdominal:     General: There is no distension.     Palpations: Abdomen is soft.     Tenderness: There is no abdominal tenderness. There is no guarding or rebound.  Musculoskeletal:     Cervical back: No spinous process tenderness.     Comments: Upper extremities: No focal bony tenderness Back: No thoracic or lumbar tenderness to palpation.  Patient does have diffuse sacral & coccygeal tenderness to palpation. Lower extremities: Tender to palpation over the left lateral knee.  Otherwise nontender.  Skin:    General:  Skin is warm and dry.     Findings: No rash.  Neurological:     Comments: CN II through XII grossly intact.  Alert.  Clear speech. baseline R sided hemiplegia.   Psychiatric:        Behavior: Behavior normal.     ED Results / Procedures / Treatments   Labs (all labs ordered are listed, but only abnormal results are displayed) Labs Reviewed - No data to display  EKG None  Radiology DG Sacrum/Coccyx  Result Date: 11/14/2019 CLINICAL DATA:  Tailbone pain after fall yesterday. EXAM: SACRUM AND COCCYX - 2+ VIEW COMPARISON:  CT abdomen pelvis dated November 08, 2014. FINDINGS: There is no evidence of fracture or other focal bone lesions. IMPRESSION: Negative. Electronically Signed   By: Titus Dubin M.D.   On: 11/14/2019 14:49   DG Knee Complete 4 Views Left  Result Date: 11/14/2019 CLINICAL DATA:  Left knee pain after fall yesterday. EXAM: LEFT KNEE - COMPLETE 4+ VIEW COMPARISON:  January 23, 2014. FINDINGS: Status post left total knee arthroplasty. The femoral and tibial components appear to be well situated. No acute fracture or dislocation is noted. No joint effusion is noted. IMPRESSION: Status post left total knee arthroplasty. No acute abnormality seen. Electronically Signed   By: Marijo Conception M.D.   On: 11/14/2019 19:27    Procedures Procedures (including critical care time)  Medications Ordered in ED Medications  lidocaine (LIDODERM) 5 % 1 patch (1 patch Transdermal Patch Applied 11/14/19 1934)  HYDROcodone-acetaminophen (NORCO/VICODIN) 5-325 MG per tablet 1 tablet (1 tablet Oral Given 11/14/19 1934)  ondansetron (ZOFRAN-ODT) disintegrating tablet 4 mg (4 mg Oral Given 11/14/19 1935)  ED Course  I have reviewed the triage vital signs and the nursing notes.  Pertinent labs & imaging results that were available during my care of the patient were reviewed by me and considered in my medical decision making (see chart for details).    MDM Rules/Calculators/A&P                          Patient presents to the emergency department status post mechanical fall yesterday.  She is noted to be hypertensive, low suspicion for hypertensive emergency, BP improved with repeat vitals.  She has no signs of serious head, neck, or back injury.  She states that she only mildly bumped her head, we discussed option of CT imaging, patient declined, feel this is reasonable given the mechanism and reassuring exam.  She did have some tenderness to palpation to the sacral/coccygeal region, x-ray was obtained and is negative for fracture or dislocation, spine is otherwise nontender and patient has no acute neurologic deficits, she does have her baseline right-sided hemiplegia.  She has no chest or abdominal tenderness to raise concern for significant intrathoracic/abdominal trauma.  She had some left lateral knee tenderness to palpation, an x-ray was ordered by me, I personally viewed and interpreted imaging, no acute process, hardware intact.  Patient neurovascularly intact distally to left knee discomfort.  She is able to ambulate some with her walker that she uses at baseline.  She feels a bit better after analgesics in the ED.  She overall appears appropriate for discharge home at this time.  Will send prescription for Lidoderm patches.  PCP follow-up. I discussed results, treatment plan, need for follow-up, and return precautions with the patient. Provided opportunity for questions, patient confirmed understanding and is in agreement with plan.   Vitals:   11/14/19 1359 11/14/19 2101  BP: (!) 192/68 (!) 145/67  Pulse: 68 72  Resp: 18 18  Temp: 98.1 F (36.7 C) 97.8 F (36.6 C)  SpO2: 96% 93%     Final Clinical Impression(s) / ED Diagnoses Final diagnoses:  Fall, initial encounter  Hypertension, unspecified type    Rx / DC Orders ED Discharge Orders         Ordered    lidocaine (LIDODERM) 5 %  Daily PRN     Discontinue  Reprint     11/14/19 2102           Sariya Trickey,  Glynda Jaeger, PA-C 11/14/19 2105    Davonna Belling, MD 11/14/19 [phone]

## 2019-11-22 IMAGING — RF DG SWALLOWING FUNCTION
12 series · 13 of 24 positions shown · non-contrast
Comparison: None.

CLINICAL DATA: 62-year-old female with recent history of stroke.
Dysphagia.

EXAM:
MODIFIED BARIUM SWALLOW
TECHNIQUE: Different consistencies of barium were administered orally to the
patient by the Speech Pathologist. Imaging of the pharynx was
performed in the lateral projection.
FLUOROSCOPY TIME:  Fluoroscopy Time:  1 minutes and 42 seconds.
Radiation Exposure Index (if provided by the fluoroscopic device):
13.4 mGy

[Series 1: before po · 1 of 36 frames shown]
[frame 6/36]
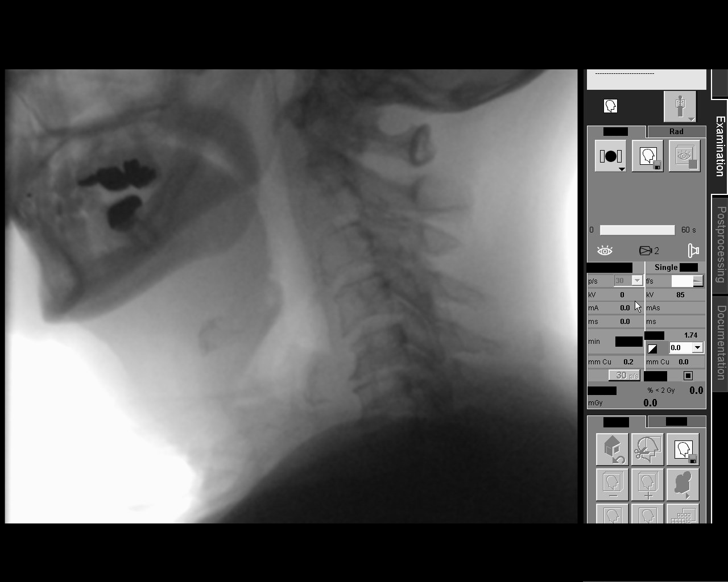

[Series 2: tsp thin · 1 of 123 frames shown]
[frame 19/123]
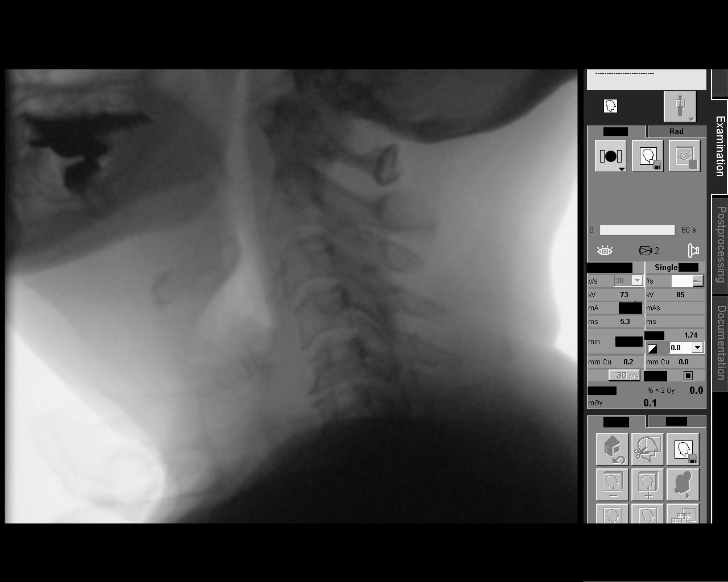

[Series 3: cup thin · 1 of 259 frames shown]
[frame 1/259]
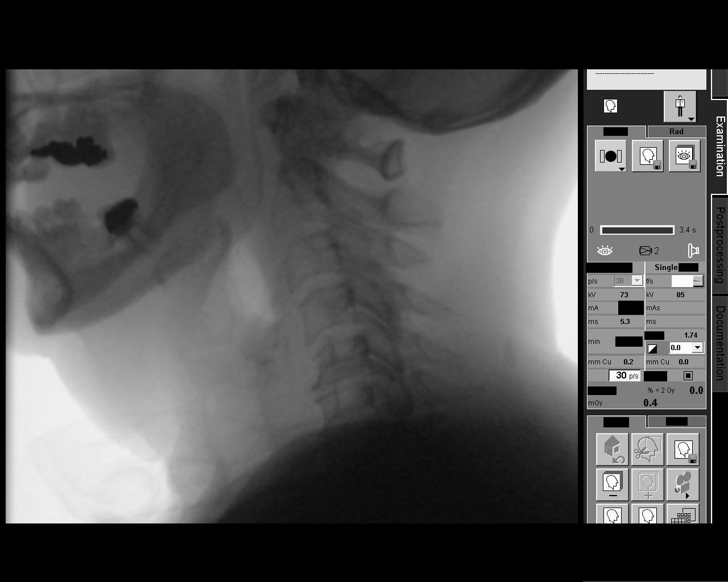

[Series 4: cup thin x 2 · 1 of 412 frames shown]
[frame 62/412]
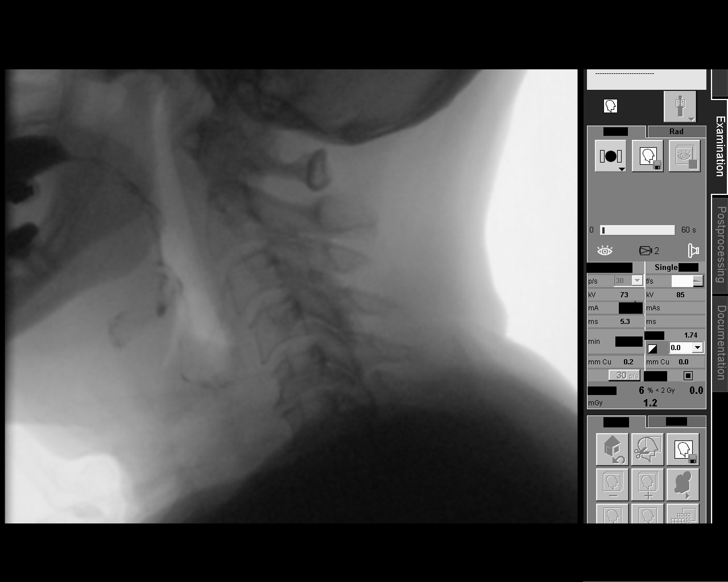

[Series 5: puree · 1 of 288 frames shown]
[frame 44/288]
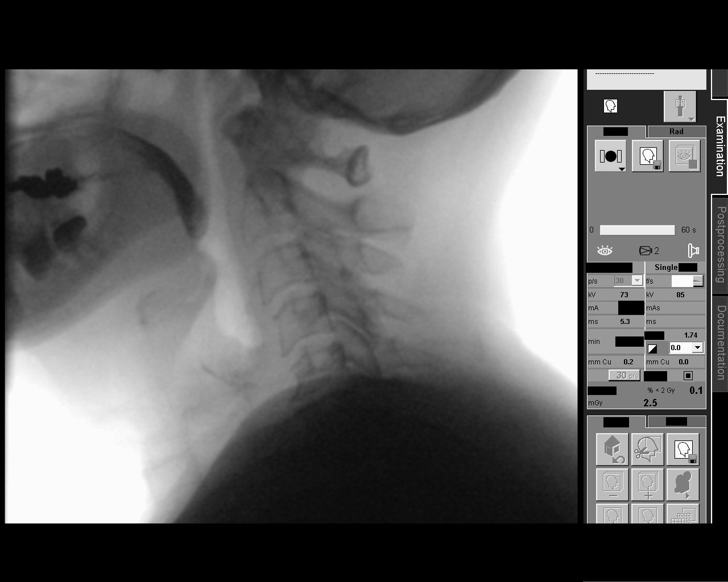

[Series 6: puree 2 · 1 of 180 frames shown]
[frame 28/180]
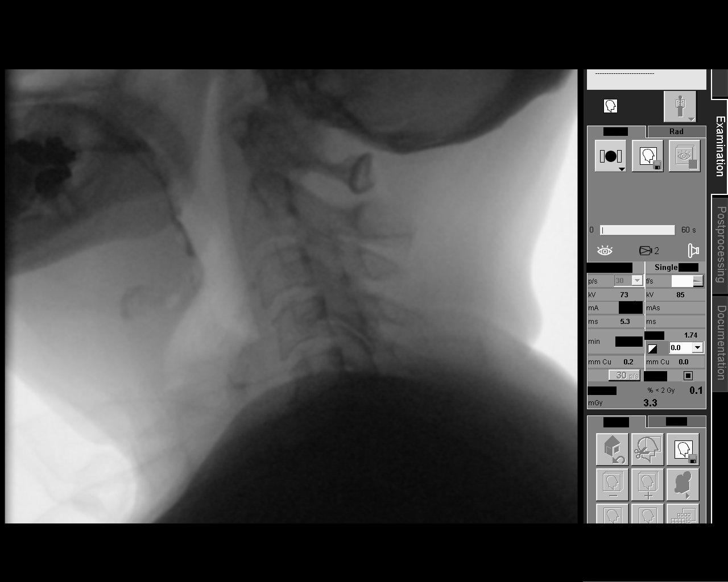

[Series 7: regular · 2 of 201 frames shown]
[frame 101/201]
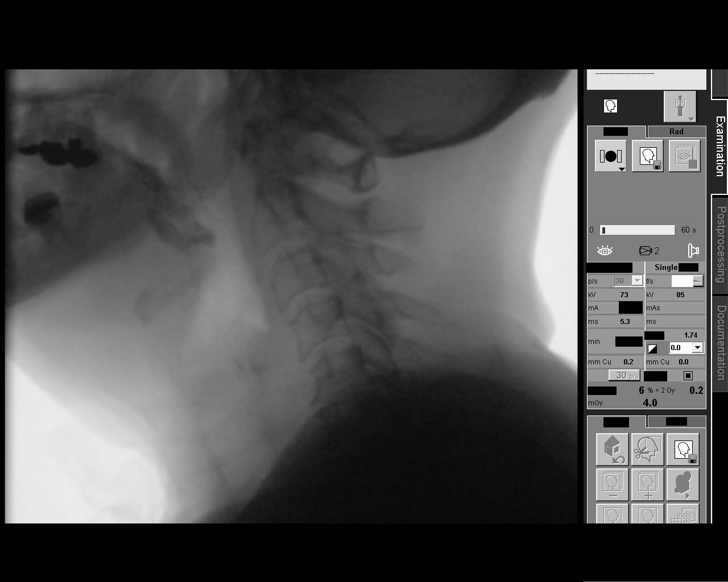
[frame 195/201]
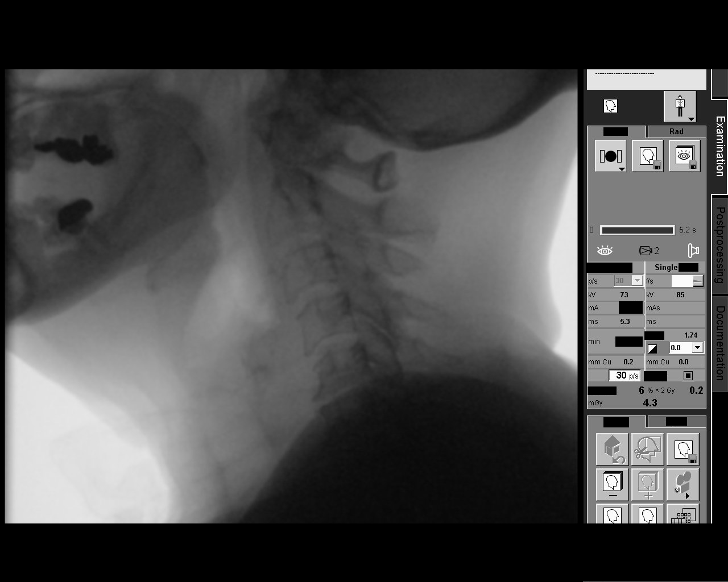

[Series 8: pill with thin w/ penetration/asp remove · 1 of 606 frames shown]
[frame 516/606]
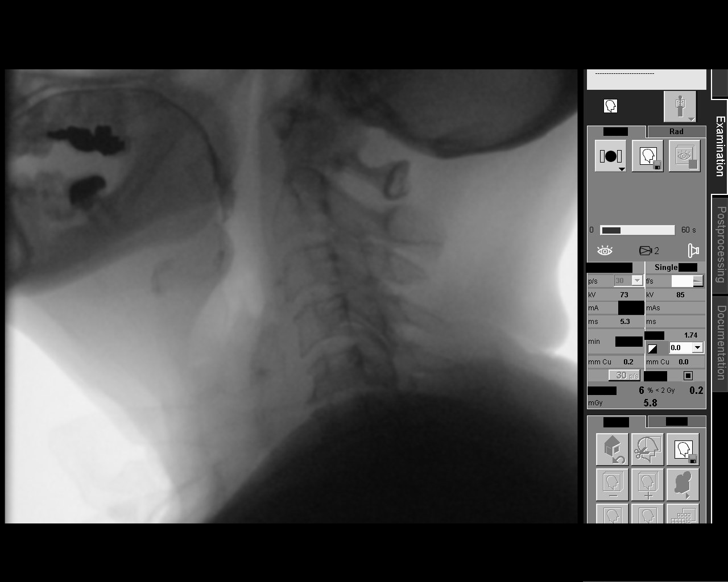

[Series 9: cued cough · 1 of 293 frames shown]
[frame 250/293]
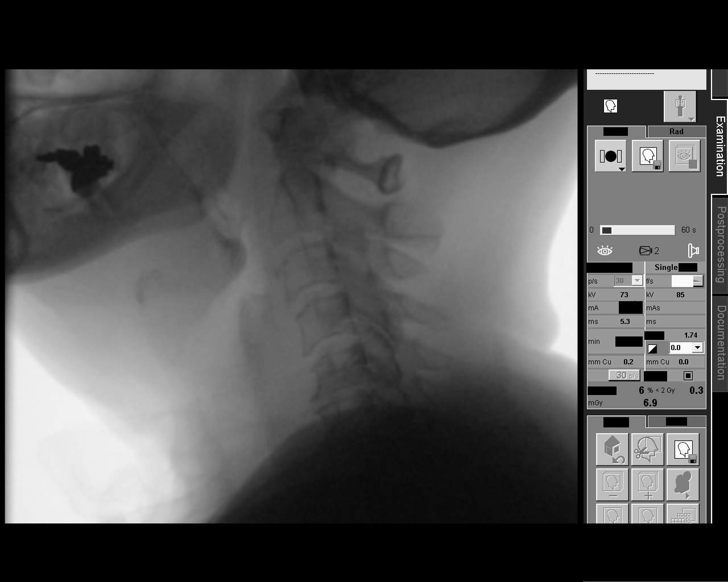

[Series 10: straw thin pen · 1 of 542 frames shown]
[frame 542/542]
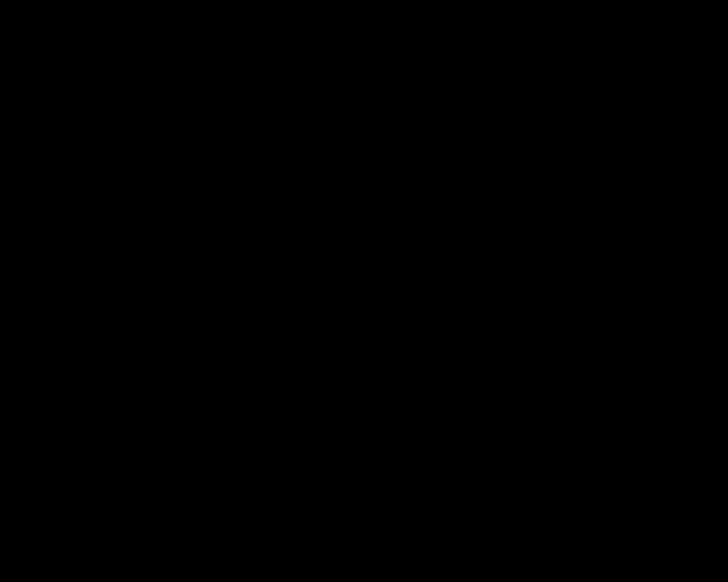

[Series 11: run · 1 of 86 frames shown]
[frame 74/86]
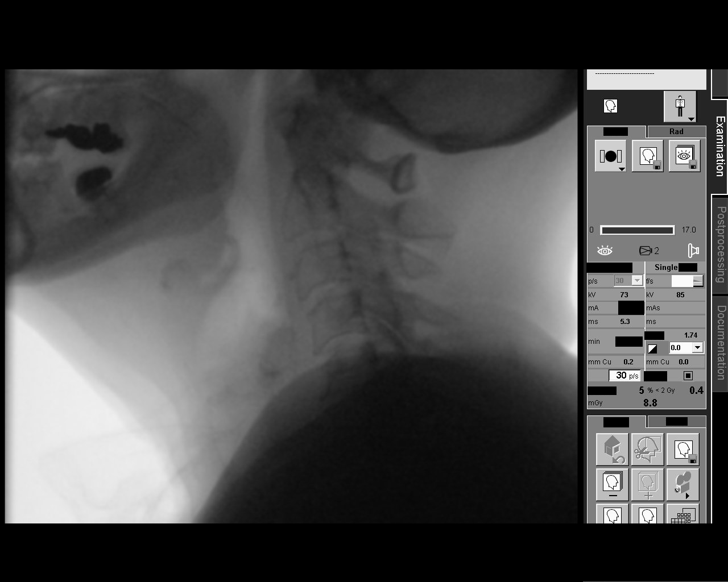

[Series 12: sweep · 1 of 206 frames shown]
[frame 176/206]
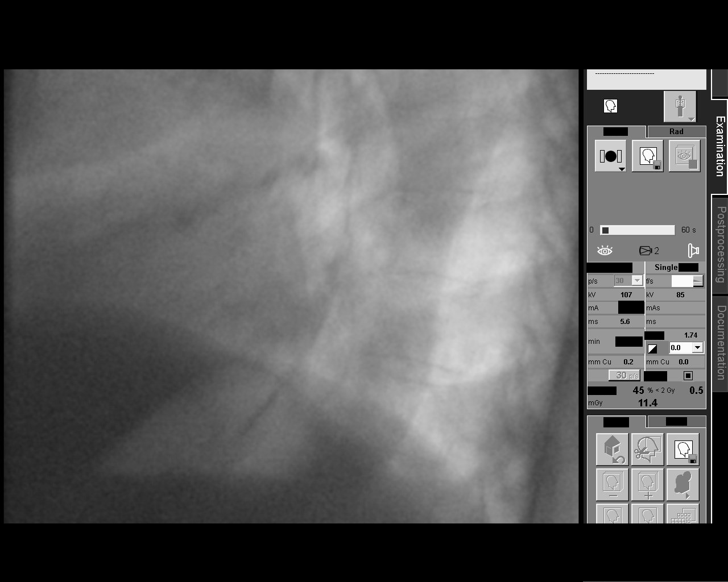

[13 of 24 positions shown; findings below may reference images not displayed]

FINDINGS: Thin liquid-Delayed oral transit. Premature spill over. Delayed
swallow trigger. Flash penetration.

Pure- Delayed oral transit. Premature spill over. Delayed swallow
trigger.

Pure with cracker- Delayed oral transit. Premature spill over.
Delayed swallow trigger.

Barium tablet -  within normal limits
IMPRESSION: 1. Abnormal speech study with findings, as above.

## 2019-12-18 DIAGNOSIS — M62838 Other muscle spasm: Secondary | ICD-10-CM | POA: Diagnosis not present

## 2019-12-18 DIAGNOSIS — H814 Vertigo of central origin: Secondary | ICD-10-CM | POA: Diagnosis not present

## 2019-12-18 DIAGNOSIS — G4762 Sleep related leg cramps: Secondary | ICD-10-CM | POA: Diagnosis not present

## 2019-12-18 DIAGNOSIS — E1165 Type 2 diabetes mellitus with hyperglycemia: Secondary | ICD-10-CM | POA: Diagnosis not present

## 2019-12-18 DIAGNOSIS — R21 Rash and other nonspecific skin eruption: Secondary | ICD-10-CM | POA: Diagnosis not present

## 2019-12-18 DIAGNOSIS — R944 Abnormal results of kidney function studies: Secondary | ICD-10-CM | POA: Diagnosis not present

## 2019-12-18 DIAGNOSIS — I10 Essential (primary) hypertension: Secondary | ICD-10-CM | POA: Diagnosis not present

## 2019-12-18 DIAGNOSIS — J309 Allergic rhinitis, unspecified: Secondary | ICD-10-CM | POA: Diagnosis not present

## 2019-12-18 DIAGNOSIS — G4700 Insomnia, unspecified: Secondary | ICD-10-CM | POA: Diagnosis not present

## 2019-12-18 DIAGNOSIS — Z0001 Encounter for general adult medical examination with abnormal findings: Secondary | ICD-10-CM | POA: Diagnosis not present

## 2019-12-23 DIAGNOSIS — G4700 Insomnia, unspecified: Secondary | ICD-10-CM | POA: Diagnosis not present

## 2019-12-23 DIAGNOSIS — E039 Hypothyroidism, unspecified: Secondary | ICD-10-CM | POA: Diagnosis not present

## 2019-12-23 DIAGNOSIS — R944 Abnormal results of kidney function studies: Secondary | ICD-10-CM | POA: Diagnosis not present

## 2019-12-23 DIAGNOSIS — E782 Mixed hyperlipidemia: Secondary | ICD-10-CM | POA: Diagnosis not present

## 2019-12-23 DIAGNOSIS — M2450 Contracture, unspecified joint: Secondary | ICD-10-CM | POA: Diagnosis not present

## 2019-12-23 DIAGNOSIS — E1169 Type 2 diabetes mellitus with other specified complication: Secondary | ICD-10-CM | POA: Diagnosis not present

## 2019-12-23 DIAGNOSIS — G4762 Sleep related leg cramps: Secondary | ICD-10-CM | POA: Diagnosis not present

## 2019-12-23 DIAGNOSIS — R69 Illness, unspecified: Secondary | ICD-10-CM | POA: Diagnosis not present

## 2019-12-23 DIAGNOSIS — Z8673 Personal history of transient ischemic attack (TIA), and cerebral infarction without residual deficits: Secondary | ICD-10-CM | POA: Diagnosis not present

## 2019-12-23 DIAGNOSIS — I10 Essential (primary) hypertension: Secondary | ICD-10-CM | POA: Diagnosis not present

## 2020-03-01 ENCOUNTER — Emergency Department (HOSPITAL_COMMUNITY): Payer: Medicare HMO

## 2020-03-01 ENCOUNTER — Encounter (HOSPITAL_COMMUNITY): Payer: Self-pay

## 2020-03-01 ENCOUNTER — Inpatient Hospital Stay (HOSPITAL_COMMUNITY)
Admission: EM | Admit: 2020-03-01 | Discharge: 2020-03-05 | DRG: 177 | Disposition: A | Discharge status: Skilled Nursing Facility | Payer: Medicare HMO | Attending: Internal Medicine | Admitting: Internal Medicine

## 2020-03-01 DIAGNOSIS — E860 Dehydration: Secondary | ICD-10-CM | POA: Diagnosis present

## 2020-03-01 DIAGNOSIS — R29810 Facial weakness: Secondary | ICD-10-CM | POA: Diagnosis not present

## 2020-03-01 DIAGNOSIS — I6782 Cerebral ischemia: Secondary | ICD-10-CM | POA: Diagnosis not present

## 2020-03-01 DIAGNOSIS — E1165 Type 2 diabetes mellitus with hyperglycemia: Secondary | ICD-10-CM | POA: Diagnosis present

## 2020-03-01 DIAGNOSIS — I69351 Hemiplegia and hemiparesis following cerebral infarction affecting right dominant side: Secondary | ICD-10-CM

## 2020-03-01 DIAGNOSIS — I10 Essential (primary) hypertension: Secondary | ICD-10-CM | POA: Diagnosis not present

## 2020-03-01 DIAGNOSIS — Z91030 Bee allergy status: Secondary | ICD-10-CM

## 2020-03-01 DIAGNOSIS — G8111 Spastic hemiplegia affecting right dominant side: Secondary | ICD-10-CM | POA: Diagnosis present

## 2020-03-01 DIAGNOSIS — R7989 Other specified abnormal findings of blood chemistry: Secondary | ICD-10-CM | POA: Diagnosis present

## 2020-03-01 DIAGNOSIS — Z7984 Long term (current) use of oral hypoglycemic drugs: Secondary | ICD-10-CM

## 2020-03-01 DIAGNOSIS — Z823 Family history of stroke: Secondary | ICD-10-CM

## 2020-03-01 DIAGNOSIS — E871 Hypo-osmolality and hyponatremia: Secondary | ICD-10-CM | POA: Diagnosis present

## 2020-03-01 DIAGNOSIS — M797 Fibromyalgia: Secondary | ICD-10-CM | POA: Diagnosis present

## 2020-03-01 DIAGNOSIS — U071 COVID-19: Principal | ICD-10-CM

## 2020-03-01 DIAGNOSIS — I639 Cerebral infarction, unspecified: Secondary | ICD-10-CM | POA: Diagnosis not present

## 2020-03-01 DIAGNOSIS — Z79899 Other long term (current) drug therapy: Secondary | ICD-10-CM

## 2020-03-01 DIAGNOSIS — Z833 Family history of diabetes mellitus: Secondary | ICD-10-CM

## 2020-03-01 DIAGNOSIS — F32A Depression, unspecified: Secondary | ICD-10-CM | POA: Diagnosis present

## 2020-03-01 DIAGNOSIS — R29898 Other symptoms and signs involving the musculoskeletal system: Secondary | ICD-10-CM | POA: Diagnosis not present

## 2020-03-01 DIAGNOSIS — E785 Hyperlipidemia, unspecified: Secondary | ICD-10-CM | POA: Diagnosis present

## 2020-03-01 DIAGNOSIS — Z8249 Family history of ischemic heart disease and other diseases of the circulatory system: Secondary | ICD-10-CM

## 2020-03-01 DIAGNOSIS — R000 Tachycardia, unspecified: Secondary | ICD-10-CM | POA: Diagnosis not present

## 2020-03-01 DIAGNOSIS — Z7982 Long term (current) use of aspirin: Secondary | ICD-10-CM

## 2020-03-01 DIAGNOSIS — Z885 Allergy status to narcotic agent status: Secondary | ICD-10-CM

## 2020-03-01 DIAGNOSIS — E039 Hypothyroidism, unspecified: Secondary | ICD-10-CM | POA: Diagnosis present

## 2020-03-01 DIAGNOSIS — J1282 Pneumonia due to coronavirus disease 2019: Secondary | ICD-10-CM | POA: Diagnosis present

## 2020-03-01 DIAGNOSIS — R0689 Other abnormalities of breathing: Secondary | ICD-10-CM | POA: Diagnosis not present

## 2020-03-01 LAB — COMPREHENSIVE METABOLIC PANEL WITH GFR
ALT: 29 U/L (ref 0–44)
AST: 36 U/L (ref 15–41)
Albumin: 3.2 g/dL — ABNORMAL LOW (ref 3.5–5.0)
Alkaline Phosphatase: 57 U/L (ref 38–126)
Anion gap: 10 (ref 5–15)
BUN: 36 mg/dL — ABNORMAL HIGH (ref 8–23)
CO2: 23 mmol/L (ref 22–32)
Calcium: 8.6 mg/dL — ABNORMAL LOW (ref 8.9–10.3)
Chloride: 97 mmol/L — ABNORMAL LOW (ref 98–111)
Creatinine, Ser: 1.47 mg/dL — ABNORMAL HIGH (ref 0.44–1.00)
GFR, Estimated: 39 mL/min — ABNORMAL LOW
Glucose, Bld: 230 mg/dL — ABNORMAL HIGH (ref 70–99)
Potassium: 3.8 mmol/L (ref 3.5–5.1)
Sodium: 130 mmol/L — ABNORMAL LOW (ref 135–145)
Total Bilirubin: 0.3 mg/dL (ref 0.3–1.2)
Total Protein: 7 g/dL (ref 6.5–8.1)

## 2020-03-01 LAB — DIFFERENTIAL
Abs Immature Granulocytes: 0.03 10*3/uL (ref 0.00–0.07)
Basophils Absolute: 0 10*3/uL (ref 0.0–0.1)
Basophils Relative: 0 %
Eosinophils Absolute: 0 10*3/uL (ref 0.0–0.5)
Eosinophils Relative: 0 %
Immature Granulocytes: 1 %
Lymphocytes Relative: 28 %
Lymphs Abs: 1.6 10*3/uL (ref 0.7–4.0)
Monocytes Absolute: 0.5 10*3/uL (ref 0.1–1.0)
Monocytes Relative: 9 %
Neutro Abs: 3.4 10*3/uL (ref 1.7–7.7)
Neutrophils Relative %: 62 %

## 2020-03-01 LAB — CBC
HCT: 36.9 % (ref 36.0–46.0)
Hemoglobin: 12 g/dL (ref 12.0–15.0)
MCH: 30 pg (ref 26.0–34.0)
MCHC: 32.5 g/dL (ref 30.0–36.0)
MCV: 92.3 fL (ref 80.0–100.0)
Platelets: 181 10*3/uL (ref 150–400)
RBC: 4 MIL/uL (ref 3.87–5.11)
RDW: 13.9 % (ref 11.5–15.5)
WBC: 5.5 10*3/uL (ref 4.0–10.5)
nRBC: 0 % (ref 0.0–0.2)

## 2020-03-01 LAB — PROTIME-INR
INR: 0.9 (ref 0.8–1.2)
Prothrombin Time: 12.1 s (ref 11.4–15.2)

## 2020-03-01 LAB — APTT: aPTT: 25 seconds (ref 24–36)

## 2020-03-01 LAB — ETHANOL: Alcohol, Ethyl (B): <10 mg/dL (ref <10)

## 2020-03-01 MED ORDER — ENOXAPARIN SODIUM 30 MG/0.3ML ~~LOC~~ SOLN
30.0000 mg | SUBCUTANEOUS | Status: DC
  Administered 2020-03-02: 30 mg via SUBCUTANEOUS
  Filled 2020-03-01: qty 0.3

## 2020-03-01 MED ORDER — ACETAMINOPHEN 325 MG PO TABS
650.0000 mg | ORAL_TABLET | Freq: Four times a day (QID) | ORAL | Status: DC | PRN
  Administered 2020-03-03 – 2020-03-05 (×3): 650 mg via ORAL
  Filled 2020-03-01 (×3): qty 2

## 2020-03-01 MED ORDER — ONDANSETRON HCL 4 MG PO TABS: 4.0000 mg | ORAL_TABLET | Freq: Four times a day (QID) | ORAL | Status: DC | PRN

## 2020-03-01 MED ORDER — POTASSIUM CHLORIDE IN NACL 20-0.9 MEQ/L-% IV SOLN
INTRAVENOUS | Status: AC
  Filled 2020-03-01: qty 1000

## 2020-03-01 MED ORDER — ACETAMINOPHEN 650 MG RE SUPP: 650.0000 mg | Freq: Four times a day (QID) | RECTAL | Status: DC | PRN

## 2020-03-01 MED ORDER — STROKE: EARLY STAGES OF RECOVERY BOOK
Freq: Once | Status: AC
  Filled 2020-03-01: qty 1

## 2020-03-01 MED ORDER — ONDANSETRON HCL 4 MG/2ML IJ SOLN: 4.0000 mg | Freq: Four times a day (QID) | INTRAMUSCULAR | Status: DC | PRN

## 2020-03-01 MED ORDER — ASPIRIN EC 325 MG PO TBEC
325.0000 mg | DELAYED_RELEASE_TABLET | Freq: Once | ORAL | Status: AC
  Administered 2020-03-01: 325 mg via ORAL
  Filled 2020-03-01: qty 1

## 2020-03-01 NOTE — Consult Note (Signed)
[facility] Tele[facility]rology Consult Services  Stat Consult  Date of Service:   03/01/2020 19:39:00  Comments/Sign-Out: 64 year old female history of hypertension, hyperlipidemia, diabetes, prior CVA with residual right upper extremity weakness at baseline who presents with 2 days of right lower extremity weakness per the husband. CT head shows bilateral chronic appearing basal ganglia infarcts, unclear whether or not she has any new ischemic changes on her CT. Recommend admission for stroke work-up and MRI imaging. Continue aspirin. Will outside the window for any type of acute therapies considering the fact that the symptoms been ongoing for the past 2 days. Neurology follow-up.  CT HEAD: Showed No Acute Hemorrhage or Acute Core Infarct  Our recommendations are outlined below.  Diagnostic Studies: Recommend MRI brain without contrast Routine MRA head without contrast and MRA Neck with contrast  Laboratory Studies: Recommend Lipid panel Hemoglobin A1c TSH  Medication: Antiplatelet Therapy recommended  Nursing Recommendations: Telemetry, IV Fluids, avoid dextrose containing fluids, Maintain euglycemia Neuro checks q4 hrs x 24 hrs and then per shift Head of bed 30 degrees  Consultations: Recommend Speech therapy if failed dysphagia screen Physical therapy/Occupational therapy  DVT Prophylaxis: Choice of Primary Team  Disposition: Neurology will follow  Additional Recommendations:    Metrics: [facility] Notification Time: 03/01/2020 19:37:10 Stamp Time: 03/01/2020 19:39:00 Callback Response Time: 03/01/2020 19:40:55   ----------------------------------------------------------------------------------------------------  Chief Complaint: Right leg weakness  History of Present Illness: Patient is a 64 year old Female.  This is a 64 year old female history of hypertension, hyperlipidemia, diabetes, prior CVAs with right-sided weakness at  baseline, who presents the emerge department for concern for right leg weakness which per the husband is new. It appears that normally she has right spastic paresis of the right upper extremity but the right leg is intact per her husband. However, apparently, she began having weakness of the right leg 2 days ago, and for this reason he she was brought into the emergency room. She is a very poor historian and unfortunately the husband is not at bedside currently however the above history was obtained from the emergency department physician. Currently, patient is not exactly sure why she is in the emergency department and she does feel weak in the legs but unable to tell me in any new lateralizing symptoms. She does appear to have weakness in the right arm greater than right leg but she is only able to lift up the right leg minimally which is apparently new for her over the past few days.   Past Medical History:     . Hypertension     . Diabetes Mellitus     . Hyperlipidemia  Anticoagulant use:  No  Antiplatelet use: asa    Examination: BP(180/83), Pulse(97), Blood Glucose(230) 1A: Level of Consciousness - Alert; keenly responsive + 0 1B: Ask Month and Age - 1 Question Right + 1 1C: Blink Eyes & Squeeze Hands - Performs Both Tasks + 0 2: Test Horizontal Extraocular Movements - Normal + 0 3: Test Visual Fields - No Visual Loss + 0 4: Test Facial Palsy (Use Grimace if Obtunded) - Normal symmetry + 0 5A: Test Left Arm Motor Drift - No Drift for 10 Seconds + 0 5B: Test Right Arm Motor Drift - No Movement + 4 6A: Test Left Leg Motor Drift - No Drift for 5 Seconds + 0 6B: Test Right Leg Motor Drift - Some Effort Against Gravity + 2 7: Test Limb Ataxia (FNF/Heel-Shin) - No Ataxia + 0 8: Test Sensation - Normal; No  sensory loss + 0 9: Test Language/Aphasia - Normal; No aphasia + 0 10: Test Dysarthria - Normal + 0 11: Test Extinction/Inattention - No abnormality + 0  NIHSS Score:  7   Patient/Family was informed the Neurology Consult would occur via TeleHealth consult by way of interactive audio and video telecommunications and consented to receiving care in this manner.  Patient is being evaluated for possible acute neurologic impairment and high probability of imminent or life-threatening deterioration. I spent total of 35 minutes providing care to this patient, including time for face to face visit via telemedicine, review of medical records, imaging studies and discussion of findings with providers, the patient and/or family.   Dr Lacie Scotts   [facility] [phone]  Case [id number]

## 2020-03-01 NOTE — ED Triage Notes (Signed)
EMS reports left leg weakness x 2 days. Says has history of previous stroke with R sided weakness.  Reports pt usually able to walk with a tripod cane but hasn't been able to for the past 2 days.  Also reports family reports her smile looks different. CBG 262.  VSS per ems.  Pt alert and oriented.  EMS started 20g IV in left ac.

## 2020-03-01 NOTE — ED Provider Notes (Signed)
[facility] EMERGENCY DEPARTMENT Provider Note   CSN: [id number] Arrival date & time: 03/01/20  1610     History Chief Complaint  Patient presents with  . Weakness    [place] is a 64 y.o. female.  HPI She presents for evaluation of right leg weakness.  She has chronic right arm weakness.  She states that this problem has been present for 2 days.  Usually she can walk with a cane but has been unable to for the last 2 days.  The patient denies other recent illnesses.  She presents by EMS.  Patient's husband arrived later, and stated that he has had to give her more help, recently because of the weakness of her right leg, within the last 2 days.  He does not notice a change in her mentation or other abilities.  She is not had any other recent illnesses.  There are no other known modifying factors.    Past Medical History:  Diagnosis Date  . Diabetes mellitus without complication ([place])   . Diverticulitis   . Fibromyalgia   . Hyperlipidemia   . Hypertension   . Papule 03/22/2015  . Stroke [facility])     Patient Active Problem List   Diagnosis Date Noted  . Right spastic hemiplegia ([place]) 05/28/2018  . Hypothyroidism 11/29/2017  . Cerebrovascular accident (CVA) ([place])   . Ischemic stroke ([place])   . Type 2 diabetes mellitus with hyperglycemia ([place]) 11/25/2017  . HLD (hyperlipidemia) 11/25/2017  . Fibromyalgia 11/25/2017  . Depression 11/25/2017  . Tobacco abuse 11/25/2017  . Papule 03/22/2015  . Hypertension   . Acute diverticulitis 11/08/2014  . Diabetes ([place]) 11/08/2014  . Renal insufficiency 11/08/2014  . Abdominal pain 11/08/2014  . Left leg weakness 04/10/2011  . Abnormal gait 04/10/2011  . S/P total knee replacement 12/28/2010  . Knee pain 12/28/2010    Past Surgical History:  Procedure Laterality Date  . CHOLECYSTECTOMY    . KNEE SURGERY Left      OB History    Gravida  0   Para  0   Term  0   Preterm  0   AB  0   Living  0     SAB  0    TAB  0   Ectopic  0   Multiple  0   Live Births              Family History  Problem Relation Age of Onset  . Diabetes Mother   . Dementia Mother   . Hypertension Mother   . Stroke Mother   . Alcohol abuse Father   . Cirrhosis Father   . Arthritis/Rheumatoid Sister   . Diabetes Brother   . Hypertension Brother   . Diabetes Maternal Grandmother   . Fibromyalgia Sister   . Hypertension Sister   . Fibromyalgia Sister   . Hypertension Sister   . Diabetes Sister   . Hypertension Sister   . Cirrhosis Brother   . Pancreatic cancer Brother     Social History   Tobacco Use  . Smoking status: Former Smoker    Packs/day: 1.50    Years: 38.00    Pack years: 57.00    Types: Cigarettes    Quit date: 03/26/2001    Years since quitting: 18.9  . Smokeless tobacco: Never Used  Vaping Use  . Vaping Use: Never used  Substance Use Topics  . Alcohol use: Yes    Alcohol/week: 1.0 standard drink  Types: 1 Glasses of wine per week    Comment: occ glass of wine  . Drug use: No    Home Medications Prior to Admission medications   Medication Sig Start Date End Date Taking? Authorizing Provider  amLODipine (NORVASC) 10 MG tablet Take 10 mg by mouth at bedtime. 03/01/18  Yes [provider]  aspirin 325 MG tablet Take 1 tablet (325 mg total) by mouth daily. 11/28/17  Yes Barton Dubois, MD  atorvastatin (LIPITOR) 40 MG tablet Take 40 mg by mouth daily. 05/05/18  Yes [provider]  carvedilol (COREG) 6.25 MG tablet Take 6.25 mg by mouth 2 (two) times daily. 01/12/20  Yes [provider]  cloNIDine (CATAPRES) 0.1 MG tablet Take 0.1 mg by mouth 2 (two) times daily. 04/25/18  Yes [provider]  diclofenac sodium (VOLTAREN) 1 % GEL Apply 3 g to 3 large joints up to 3 times a day when necessary 02/27/18  Yes Ofilia Neas, PA-C  fenofibrate 160 MG tablet Take 160 mg by mouth daily.  11/06/14  Yes [provider]  hydrALAZINE (APRESOLINE) 10  MG tablet Take 10 mg by mouth 2 (two) times daily. 04/05/18  Yes [provider]  ketoconazole (NIZORAL) 2 % cream Apply 1 application topically 2 (two) times daily. 02/05/20  Yes [provider]  lidocaine (LIDODERM) 5 % Place 1 patch onto the skin daily as needed. Apply patch to area most significant pain once per day.  Remove and discard patch within 12 hours of application. 11/14/19  Yes Petrucelli, Samantha R, PA-C  metFORMIN (GLUCOPHAGE) 1000 MG tablet Take 1 tablet (1,000 mg total) by mouth 2 (two) times daily with a meal. 11/27/17 03/01/20 Yes Barton Dubois, MD  olmesartan (BENICAR) 40 MG tablet Take 40 mg by mouth daily. 04/27/18  Yes [provider]  omega-3 acid ethyl esters (LOVAZA) 1 g capsule Take 1 capsule (1 g total) by mouth 2 (two) times daily. 11/27/17  Yes Barton Dubois, MD  simvastatin (ZOCOR) 40 MG tablet Take 40 mg by mouth daily at 6 PM.  01/30/18  Yes [provider]  Worth 100-33 UNT-MCG/ML SOPN Inject 16 Units into the skin daily before breakfast. (Use less than 1 hour before first meal of the day) 03/22/18  Yes [provider]  albuterol (PROVENTIL HFA;VENTOLIN HFA) 108 (90 Base) MCG/ACT inhaler Inhale 2 puffs into the lungs every 6 (six) hours as needed.  04/18/18   [provider]  baclofen 5 MG TABS Take 5 mg by mouth 2 (two) times daily. Start 1 tablet twice daily x 1 week then three times daily Patient not taking: Reported on 03/01/2020 05/27/18   Garvin Fila, MD  Blood Glucose Monitoring Suppl (ONETOUCH VERIO) w/Device KIT USE TO [place] DAILY 11/22/17   [provider]  hydrochlorothiazide (HYDRODIURIL) 12.5 MG tablet Take 12.5 mg by mouth daily. 02/16/20   [provider]  insulin detemir (LEVEMIR) 100 UNIT/ML injection Inject 0.3 mLs (30 Units total) into the skin daily. Patient not taking: Reported on 03/01/2020 11/28/17   Barton Dubois, MD  losartan (COZAAR) 100 MG tablet Take  100 mg by mouth daily. 01/16/18   [provider]  ONETOUCH VERIO test strip USE 1 STRIP TO [place] DAILY 11/23/17   [provider]    Allergies    Bee pollen, Bee venom, and Codeine  Review of Systems   Review of Systems  All other systems reviewed and are negative.  Physical Exam Updated Vital Signs BP (!) 180/83   Pulse 96   Temp 99.9 F (37.7 C) (Oral)   Resp (!) 26   Ht _0  (1.6 m)   Wt 66.7 kg   SpO2 95%   BMI 26.04 kg/m   Physical Exam Vitals and nursing note reviewed.  Constitutional:      General: She is not in acute distress.    Appearance: She is well-developed. She is obese. She is not ill-appearing, toxic-appearing or diaphoretic.  HENT:     Head: Normocephalic and atraumatic.  Eyes:     Conjunctiva/sclera: Conjunctivae normal.     Pupils: Pupils are equal, round, and reactive to light.  Neck:     Trachea: Phonation normal.  Cardiovascular:     Rate and Rhythm: Normal rate and regular rhythm.  Pulmonary:     Effort: Pulmonary effort is normal.     Breath sounds: Normal breath sounds.  Chest:     Chest wall: No tenderness.  Abdominal:     General: There is no distension.     Palpations: Abdomen is soft.     Tenderness: There is no abdominal tenderness. There is no guarding.  Musculoskeletal:        General: Normal range of motion.     Cervical back: Normal range of motion and neck supple.  Skin:    General: Skin is warm and dry.  Neurological:     Mental Status: She is alert and oriented to person, place, and time.     Cranial Nerves: Cranial nerve deficit (Mild right mid face weakness as compared to left) present.     Sensory: No sensory deficit.     Motor: No abnormal muscle tone.     Comments: Spasticity and weakness of right upper extremity.  Flaccid right leg.  Normal strength, left arm and left leg.  Psychiatric:        Mood and Affect: Mood normal.        Behavior: Behavior normal.     ED Results  / Procedures / Treatments   Labs (all labs ordered are listed, but only abnormal results are displayed) Labs Reviewed  COMPREHENSIVE METABOLIC PANEL - Abnormal; Notable for the following components:      Result Value   Sodium 130 (*)    Chloride 97 (*)    Glucose, Bld 230 (*)    BUN 36 (*)    Creatinine, Ser 1.47 (*)    Calcium 8.6 (*)    Albumin 3.2 (*)    GFR, Estimated 39 (*)    All other components within normal limits  RESPIRATORY PANEL BY RT PCR (FLU A&B, COVID)  ETHANOL  PROTIME-INR  APTT  CBC  DIFFERENTIAL  RAPID URINE DRUG SCREEN, HOSP PERFORMED  URINALYSIS, ROUTINE W REFLEX MICROSCOPIC  I-STAT CHEM 8, ED    EKG EKG Interpretation  Date/Time:  Monday March 01 2020 16:24:31 EDT Ventricular Rate:  98 PR Interval:    QRS Duration: 79 QT Interval:  332 QTC Calculation: 422 R Axis:   -32 Text Interpretation: Sinus rhythm Left axis deviation Abnormal R-wave progression, early transition Baseline wander Non-specific ST-t changes Since last tracing Non-specific ST-t changes are new Serial tracing suggested Confirmed by Daleen Bo [phone]) on 03/01/2020 5:29:07 PM   Radiology CT HEAD WO CONTRAST  Result Date: 03/01/2020 CLINICAL DATA:  Neuro deficit, acute stroke suspected. Left leg weakness at X 2 days. History of previous stroke with right-sided weakness. Per family, smile looks different. EXAM:  CT HEAD WITHOUT CONTRAST TECHNIQUE: Contiguous axial images were obtained from the base of the skull through the vertex without intravenous contrast. COMPARISON:  MR head 11/26/2017. CT head 11/25/2017 FINDINGS: Brain: Patchy and confluent areas of decreased attenuation are noted throughout the deep and periventricular white matter of the cerebral hemispheres bilaterally, compatible with chronic microvascular ischemic disease. Suggestion of bilateral chronic basal ganglia lacunar infarctions. Slightly more conspicuous/possibly slightly larger in size right basal ganglia  hypodensity. No evidence of large-territorial acute infarction. No parenchymal hemorrhage. No mass lesion. No extra-axial collection. No mass effect or midline shift. No hydrocephalus. Basilar cisterns are patent. Vascular: No hyperdense vessel. Atherosclerotic calcifications are present within the cavernous internal carotid arteries. Skull: No acute fracture or focal lesion. Sinuses/Orbits: Paranasal sinuses and mastoid air cells are clear. The orbits are unremarkable. Other: None. IMPRESSION: 1. Microvascular ischemic changes as well as bilateral chronic basal ganglia lacunar infarcts with possibly slightly larger right basal ganglia hypodensity. Cannot exclude overlying acute/subacute infarction. Consider noncontrast MRI for further/more sensitive evaluation. 2. No acute intracranial hemorrhage. These results were called by telephone at the time of interpretation on 03/01/2020 at 6:41 pm to provider [facility] , who verbally acknowledged these results. Electronically Signed   By: Iven Finn M.D.   On: 03/01/2020 18:39    Procedures .Critical Care Performed by: Daleen Bo, MD Authorized by: Daleen Bo, MD   Critical care provider statement:    Critical care time (minutes):  45   Critical care start time:  03/01/2020 4:15 PM   Critical care end time:  03/01/2020 9:32 PM   Critical care time was exclusive of:  Separately billable procedures and treating other patients   Critical care was necessary to treat or prevent imminent or life-threatening deterioration of the following conditions:  CNS failure or compromise   Critical care was time spent personally by me on the following activities:  Blood draw for specimens, development of treatment plan with patient or surrogate, discussions with consultants, evaluation of patient's response to treatment, examination of patient, obtaining history from patient or surrogate, ordering and performing treatments and interventions, ordering and review  of laboratory studies, pulse oximetry, re-evaluation of patient's condition, review of old charts and ordering and review of radiographic studies   (including critical care time)  Medications Ordered in ED Medications - No data to display  ED Course  I have reviewed the triage vital signs and the nursing notes.  Pertinent labs & imaging results that were available during my care of the patient were reviewed by me and considered in my medical decision making (see chart for details).  Clinical Course as of Mar 01 2129  Mon Mar 01, 2020  2050 Case discussed with teleneurology who saw the patient and states that she may possibly have a new left basal ganglia stroke however she does have bilateral basal ganglia abnormalities.  He recommends starting aspirin, daily, and performing usual stroke evaluation.  He will send recommendations.   [EW]  2127 Normal  Ethanol [EW]  2128 Normal except sodium low, chloride low, glucose high, BUN high, creatinine high, calcium low, albumin low, GFR low   [EW]  2128 Normal  CBC [EW]  2128 Per radiology, no clear evidence for acute infarct but possible left basal ganglia stroke.  CT HEAD WO CONTRAST [EW]    Clinical Course User Index [EW] Daleen Bo, MD   MDM Rules/Calculators/A&P  Patient Vitals for the past 24 hrs:  BP Temp Temp src Pulse Resp SpO2 Height Weight  03/01/20 1800 (!) 180/83 -- -- 96 (!) 26 95 % -- --  03/01/20 1730 (!) 198/141 -- -- 99 (!) 30 94 % -- --  03/01/20 1700 (!) 182/77 -- -- 98 (!) 29 93 % -- --  03/01/20 1630 (!) 178/85 -- -- 100 (!) 22 90 % -- --  03/01/20 1618 (!) 154/82 -- -- 98 18 93 % -- --  03/01/20 1616 -- -- -- -- -- -- _0  (1.6 m) 66.7 kg  03/01/20 1615 -- 99.9 F (37.7 C) Oral -- -- -- -- --    9:29 PM Reevaluation with update and discussion. After initial assessment and treatment, an updated evaluation reveals no change in clinical status Discussed with the patient and all  questions were answered. Daleen Bo   Medical Decision Making:  This patient is presenting for evaluation of right leg and possibly right face weakness, new for 2 days, which does require a range of treatment options, and is a complaint that involves a high risk of morbidity and mortality. The differential diagnoses include CVA, progression of prior versus new, and metabolic disorder. I decided to review old records, and in summary elderly female, living at home presenting with new right leg weakness.  I received additional historical information from husband at the bedside.  Clinical Laboratory Tests Ordered, included CBC, Metabolic panel and Urinalysis. Review indicates mild metabolic abnormalities including sodium and chloride low, BUN and creatinine high, GFR low, glucose high. Radiologic Tests Ordered, included CT head.  I independently Visualized: Radiographic images, which show chronic changes without definite infarct per radiology  Cardiac Monitor Tracing which shows normal sinus rhythm   Critical Interventions-clinical evaluation, laboratory testing, CT imaging, neurology consultation and discussion with neurologist, observation and reevaluation  After These Interventions, the Patient was reevaluated and was found to require hospitalization further evaluation for apparent stroke causing new right leg weakness.  Plan admit for further stroke evaluation.  Start aspirin.  CRITICAL CARE-yes Performed by: Daleen Bo  Nursing Notes Reviewed/ Care Coordinated Applicable Imaging Reviewed Interpretation of Laboratory Data incorporated into ED treatment   9:32 PM-Consult complete with hospitalist. Patient case explained and discussed.  He agrees to admit patient for further evaluation and treatment. Call ended at 9:40 PM  Plan: Admit    Final Clinical Impression(s) / ED Diagnoses Final diagnoses:  Cerebrovascular accident    Rx / DC Orders ED Discharge Orders    None        Daleen Bo, MD 03/01/20 2140

## 2020-03-02 ENCOUNTER — Observation Stay (HOSPITAL_COMMUNITY): Payer: Medicare HMO

## 2020-03-02 ENCOUNTER — Observation Stay (HOSPITAL_BASED_OUTPATIENT_CLINIC_OR_DEPARTMENT_OTHER): Payer: Medicare HMO

## 2020-03-02 DIAGNOSIS — G459 Transient cerebral ischemic attack, unspecified: Secondary | ICD-10-CM

## 2020-03-02 DIAGNOSIS — R29898 Other symptoms and signs involving the musculoskeletal system: Secondary | ICD-10-CM | POA: Diagnosis not present

## 2020-03-02 DIAGNOSIS — I6523 Occlusion and stenosis of bilateral carotid arteries: Secondary | ICD-10-CM | POA: Diagnosis not present

## 2020-03-02 DIAGNOSIS — R29818 Other symptoms and signs involving the nervous system: Secondary | ICD-10-CM | POA: Diagnosis not present

## 2020-03-02 DIAGNOSIS — Z720 Tobacco use: Secondary | ICD-10-CM | POA: Diagnosis not present

## 2020-03-02 DIAGNOSIS — E785 Hyperlipidemia, unspecified: Secondary | ICD-10-CM

## 2020-03-02 DIAGNOSIS — R531 Weakness: Secondary | ICD-10-CM | POA: Diagnosis not present

## 2020-03-02 DIAGNOSIS — U071 COVID-19: Principal | ICD-10-CM

## 2020-03-02 DIAGNOSIS — I10 Essential (primary) hypertension: Secondary | ICD-10-CM

## 2020-03-02 LAB — GLUCOSE, CAPILLARY
Glucose-Capillary: 148 mg/dL — ABNORMAL HIGH (ref 70–99)
Glucose-Capillary: 159 mg/dL — ABNORMAL HIGH (ref 70–99)
Glucose-Capillary: 162 mg/dL — ABNORMAL HIGH (ref 70–99)
Glucose-Capillary: 173 mg/dL — ABNORMAL HIGH (ref 70–99)

## 2020-03-02 LAB — LIPID PANEL
Cholesterol: 164 mg/dL (ref 0–200)
HDL: 23 mg/dL — ABNORMAL LOW (ref >40)
LDL Cholesterol: 87 mg/dL (ref 0–99)
Total CHOL/HDL Ratio: 7.1 RATIO
Triglycerides: 268 mg/dL — ABNORMAL HIGH (ref <150)
VLDL: 54 mg/dL — ABNORMAL HIGH (ref 0–40)

## 2020-03-02 LAB — RESPIRATORY PANEL BY RT PCR (FLU A&B, COVID)
Influenza A by PCR: NEGATIVE
Influenza B by PCR: NEGATIVE
SARS Coronavirus 2 by RT PCR: POSITIVE — AB

## 2020-03-02 LAB — ECHOCARDIOGRAM COMPLETE
AR max vel: 2.06 cm2
AV Area VTI: 2.32 cm2
AV Area mean vel: 1.92 cm2
AV Mean grad: 5.2 mmHg
AV Peak grad: 8.2 mmHg
Ao pk vel: 1.43 m/s
Area-P 1/2: 2.85 cm2
Height: 63 in
S' Lateral: 1.8 cm
Weight: 2352 oz

## 2020-03-02 LAB — MAGNESIUM: Magnesium: 1.6 mg/dL — ABNORMAL LOW (ref 1.7–2.4)

## 2020-03-02 LAB — CBG MONITORING, ED
Glucose-Capillary: 202 mg/dL — ABNORMAL HIGH (ref 70–99)
Glucose-Capillary: 231 mg/dL — ABNORMAL HIGH (ref 70–99)
Glucose-Capillary: 249 mg/dL — ABNORMAL HIGH (ref 70–99)

## 2020-03-02 LAB — PHOSPHORUS: Phosphorus: 3 mg/dL (ref 2.5–4.6)

## 2020-03-02 LAB — D-DIMER, QUANTITATIVE: D-Dimer, Quant: 0.87 ug/mL-FEU — ABNORMAL HIGH (ref 0.00–0.50)

## 2020-03-02 LAB — FERRITIN: Ferritin: 92 ng/mL (ref 11–307)

## 2020-03-02 LAB — PROCALCITONIN: Procalcitonin: 0.13 ng/mL

## 2020-03-02 LAB — C-REACTIVE PROTEIN: CRP: 0.7 mg/dL (ref <1.0)

## 2020-03-02 LAB — BRAIN NATRIURETIC PEPTIDE: B Natriuretic Peptide: 14 pg/mL (ref 0.0–100.0)

## 2020-03-02 LAB — HEMOGLOBIN A1C
Hgb A1c MFr Bld: 8.1 % — ABNORMAL HIGH (ref 4.8–5.6)
Mean Plasma Glucose: 185.77 mg/dL

## 2020-03-02 LAB — FIBRINOGEN: Fibrinogen: 549 mg/dL — ABNORMAL HIGH (ref 210–475)

## 2020-03-02 LAB — HIV ANTIBODY (ROUTINE TESTING W REFLEX): HIV Screen 4th Generation wRfx: NONREACTIVE

## 2020-03-02 MED ORDER — FENOFIBRATE 160 MG PO TABS
160.0000 mg | ORAL_TABLET | Freq: Every day | ORAL | Status: DC
  Administered 2020-03-03 – 2020-03-05 (×3): 160 mg via ORAL
  Filled 2020-03-02 (×5): qty 1

## 2020-03-02 MED ORDER — ASPIRIN EC 325 MG PO TBEC
325.0000 mg | DELAYED_RELEASE_TABLET | Freq: Once | ORAL | Status: AC
  Administered 2020-03-02: 325 mg via ORAL
  Filled 2020-03-02: qty 1

## 2020-03-02 MED ORDER — ALBUTEROL SULFATE HFA 108 (90 BASE) MCG/ACT IN AERS: 2.0000 | INHALATION_SPRAY | Freq: Four times a day (QID) | RESPIRATORY_TRACT | Status: DC | PRN

## 2020-03-02 MED ORDER — ENOXAPARIN SODIUM 40 MG/0.4ML ~~LOC~~ SOLN
40.0000 mg | SUBCUTANEOUS | Status: DC
  Administered 2020-03-02 – 2020-03-04 (×2): 40 mg via SUBCUTANEOUS
  Filled 2020-03-02 (×3): qty 0.4

## 2020-03-02 MED ORDER — INSULIN ASPART 100 UNIT/ML ~~LOC~~ SOLN
0.0000 [IU] | Freq: Three times a day (TID) | SUBCUTANEOUS | Status: DC
  Administered 2020-03-02 (×2): 4 [IU] via SUBCUTANEOUS
  Administered 2020-03-03: 3 [IU] via SUBCUTANEOUS
  Administered 2020-03-03 (×2): 4 [IU] via SUBCUTANEOUS
  Administered 2020-03-04 – 2020-03-05 (×4): 3 [IU] via SUBCUTANEOUS

## 2020-03-02 MED ORDER — METHYLPREDNISOLONE SODIUM SUCC 40 MG IJ SOLR: 0.5000 mg/kg | Freq: Once | INTRAMUSCULAR | Status: DC

## 2020-03-02 MED ORDER — ALBUTEROL SULFATE HFA 108 (90 BASE) MCG/ACT IN AERS
2.0000 | INHALATION_SPRAY | Freq: Four times a day (QID) | RESPIRATORY_TRACT | Status: DC
  Administered 2020-03-02 – 2020-03-03 (×8): 2 via RESPIRATORY_TRACT
  Filled 2020-03-02 (×2): qty 6.7

## 2020-03-02 MED ORDER — INSULIN GLARGINE-LIXISENATIDE 100-33 UNT-MCG/ML ~~LOC~~ SOPN: 16.0000 [IU] | PEN_INJECTOR | Freq: Every day | SUBCUTANEOUS | Status: DC

## 2020-03-02 MED ORDER — INSULIN GLARGINE 100 UNIT/ML ~~LOC~~ SOLN
12.0000 [IU] | Freq: Two times a day (BID) | SUBCUTANEOUS | Status: DC
  Administered 2020-03-02 – 2020-03-04 (×5): 12 [IU] via SUBCUTANEOUS
  Filled 2020-03-02 (×12): qty 0.12

## 2020-03-02 MED ORDER — FAMOTIDINE IN NACL 20-0.9 MG/50ML-% IV SOLN: 20.0000 mg | Freq: Once | INTRAVENOUS | Status: DC | PRN

## 2020-03-02 MED ORDER — BACLOFEN 10 MG PO TABS
5.0000 mg | ORAL_TABLET | Freq: Two times a day (BID) | ORAL | Status: DC
  Administered 2020-03-02 – 2020-03-05 (×6): 5 mg via ORAL
  Filled 2020-03-02 (×6): qty 1

## 2020-03-02 MED ORDER — SODIUM CHLORIDE 0.9 % IV SOLN: INTRAVENOUS | Status: DC | PRN

## 2020-03-02 MED ORDER — INSULIN GLARGINE 100 UNIT/ML ~~LOC~~ SOLN
16.0000 [IU] | Freq: Every day | SUBCUTANEOUS | Status: DC
  Filled 2020-03-02 (×2): qty 0.16

## 2020-03-02 MED ORDER — INSULIN ASPART 100 UNIT/ML ~~LOC~~ SOLN: 0.0000 [IU] | SUBCUTANEOUS | Status: DC

## 2020-03-02 MED ORDER — GADOBUTROL 1 MMOL/ML IV SOLN
8.0000 mL | Freq: Once | INTRAVENOUS | Status: AC | PRN
  Administered 2020-03-02: 8 mL via INTRAVENOUS

## 2020-03-02 MED ORDER — EPINEPHRINE 0.3 MG/0.3ML IJ SOAJ
0.3000 mg | Freq: Once | INTRAMUSCULAR | Status: DC | PRN
  Filled 2020-03-02: qty 0.6

## 2020-03-02 MED ORDER — SODIUM CHLORIDE 0.9 % IV SOLN: 1200.0000 mg | Freq: Once | INTRAVENOUS | Status: DC

## 2020-03-02 MED ORDER — SODIUM CHLORIDE 0.9 % IV SOLN: INTRAVENOUS | Status: AC

## 2020-03-02 MED ORDER — OMEGA-3-ACID ETHYL ESTERS 1 G PO CAPS
1.0000 g | ORAL_CAPSULE | Freq: Two times a day (BID) | ORAL | Status: DC
  Administered 2020-03-02 – 2020-03-05 (×6): 1 g via ORAL
  Filled 2020-03-02 (×8): qty 1

## 2020-03-02 MED ORDER — DIPHENHYDRAMINE HCL 50 MG/ML IJ SOLN: 50.0000 mg | Freq: Once | INTRAMUSCULAR | Status: DC | PRN

## 2020-03-02 MED ORDER — METHYLPREDNISOLONE SODIUM SUCC 125 MG IJ SOLR: 125.0000 mg | Freq: Once | INTRAMUSCULAR | Status: DC | PRN

## 2020-03-02 MED ORDER — LINAGLIPTIN 5 MG PO TABS
5.0000 mg | ORAL_TABLET | Freq: Every day | ORAL | Status: DC
  Administered 2020-03-03 – 2020-03-05 (×3): 5 mg via ORAL
  Filled 2020-03-02 (×5): qty 1

## 2020-03-02 MED ORDER — INSULIN ASPART 100 UNIT/ML ~~LOC~~ SOLN: 0.0000 [IU] | Freq: Three times a day (TID) | SUBCUTANEOUS | Status: DC

## 2020-03-02 MED ORDER — INSULIN DETEMIR 100 UNIT/ML ~~LOC~~ SOLN: 0.1000 [IU]/kg | Freq: Two times a day (BID) | SUBCUTANEOUS | Status: DC

## 2020-03-02 MED ORDER — SODIUM CHLORIDE 0.9 % IV SOLN
Freq: Once | INTRAVENOUS | Status: DC
  Filled 2020-03-02: qty 20

## 2020-03-02 MED ORDER — ATORVASTATIN CALCIUM 40 MG PO TABS
40.0000 mg | ORAL_TABLET | Freq: Every day | ORAL | Status: DC
  Administered 2020-03-03 – 2020-03-05 (×3): 40 mg via ORAL
  Filled 2020-03-02 (×3): qty 1

## 2020-03-02 MED ORDER — GUAIFENESIN-DM 100-10 MG/5ML PO SYRP: 10.0000 mL | ORAL_SOLUTION | ORAL | Status: DC | PRN

## 2020-03-02 MED ORDER — METFORMIN HCL 500 MG PO TABS: 1000.0000 mg | ORAL_TABLET | Freq: Two times a day (BID) | ORAL | Status: DC

## 2020-03-02 MED ORDER — INSULIN ASPART 100 UNIT/ML ~~LOC~~ SOLN
2.0000 [IU] | Freq: Once | SUBCUTANEOUS | Status: AC
  Administered 2020-03-02: 2 [IU] via SUBCUTANEOUS
  Filled 2020-03-02: qty 1

## 2020-03-02 MED ORDER — MAGNESIUM SULFATE 2 GM/50ML IV SOLN
2.0000 g | Freq: Once | INTRAVENOUS | Status: AC
  Administered 2020-03-02: 2 g via INTRAVENOUS
  Filled 2020-03-02: qty 50

## 2020-03-02 NOTE — Progress Notes (Signed)
SLP Cancellation Note  Patient Details Name: [name] MRN: [id number] DOB: 06-17-1955   Cancelled treatment:       Reason Eval/Treat Not Completed: Other (comment); Order received for SLE, however unable to complete today. Pt passed the [place] swallow screen in the ED, however she is still NPO (nursing alerted that Pt does not have order for BSE).   Thank you,  Havery Moros, CCC-SLP [phone]    [name] 03/02/2020, 6:55 PM

## 2020-03-02 NOTE — Evaluation (Signed)
Physical Therapy Evaluation Patient Details Name: [name] MRN: [id number] DOB: 06-24-1955 Today's Date: 03/02/2020   History of Present Illness  [name] is a 64 y.o. female with medical history significant of hypertension, hyperlipidemia, type 2 diabetes mellitus, tobacco abuse and fibromyalgia; previous CVA on November 25, 2017 who presented to the emergency department secondary to right-sided lower extremity weakness since 2 days ago.  She also says that she has been experiencing generalized fatigue.  She denies fever, but complains of chills, malaise and decreased appetite.  Positive renal area, sore throat, dry cough and mild dyspnea.  Positive pleuritic chest pain.  She denies precordial chest pain, minimal pain, but states she has had multiple episodes of loose stools.  No dysuria, frequency or hematuria.    Clinical Impression  Patient demonstrates slow labored movement for sitting up at bedside, RUE non-functional, had difficulty propping up on left elbow for supine to sitting, very unsteady on feet, at high risk for alls, limited to a few steps at bedside leaning on RW using LUE (normally uses Hemi-walker at home) and limited mostly due fall risk and fatigue.  Patient put back to bed after therapy.  Patient will benefit from continued physical therapy in hospital and recommended venue below to increase strength, balance, endurance for safe ADLs and gait.     Follow Up Recommendations SNF    Equipment Recommendations  None recommended by PT    Recommendations for Other Services       Precautions / Restrictions        Mobility  Bed Mobility Overal bed mobility: Needs Assistance Bed Mobility: Supine to Sit;Sit to Supine     Supine to sit: Mod assist Sit to supine: Mod assist   General bed mobility comments: slow labored movement    Transfers Overall transfer level: Needs assistance Equipment used: Rolling walker (2 wheeled);1 person hand held  assist Transfers: Sit to/from [employer] Sit to Stand: Min assist;Mod assist Stand pivot transfers: Mod assist       General transfer comment: slow labored movement  Ambulation/Gait Ambulation/Gait assistance: Mod assist;Max assist Gait Distance (Feet): 5 Feet Assistive device: Rolling walker (2 wheeled) Gait Pattern/deviations: Decreased step length - right;Decreased step length - left;Decreased stride length;Decreased stance time - right Gait velocity: slow   General Gait Details: limited to 5-6 slow labored side steps and a couple of steps forward backwards due to poor standing balance, occasional buckling of RLE, used RW with left hand  Stairs            Wheelchair Mobility    Modified Rankin (Stroke Patients Only)       Balance Overall balance assessment: Needs assistance Sitting-balance support: Feet supported;No upper extremity supported Sitting balance-Leahy Scale: Fair Sitting balance - Comments: seated at EOB   Standing balance support: During functional activity;Single extremity supported Standing balance-Leahy Scale: Poor Standing balance comment: fair/poor using left hand on RW                             Pertinent Vitals/Pain Pain Assessment: No/denies pain    Home Living Family/patient expects to be discharged to:: Private residence Living Arrangements: Spouse/significant other Available Help at Discharge: Family;Available PRN/intermittently Type of Home: House Home Access: Stairs to enter Entrance Stairs-Rails: Right;Left;Can reach both Entrance Stairs-Number of Steps: 4-5 Home Layout: One level Home Equipment: Cane - single point;Walker - 2 wheels;Shower seat;Bedside commode;Other (comment) (Hemi-walker) Additional Comments: [place] ambulator using  Hemi-walker    Prior Function Level of Independence: Needs assistance   Gait / Transfers Assistance Needed: household ambulator using Hemi-walker, non-functional  RUE  ADL's / Homemaking Assistance Needed: assisted by family        Hand Dominance   Dominant Hand: Right    Extremity/Trunk Assessment   Upper Extremity Assessment Upper Extremity Assessment: Defer to OT evaluation    Lower Extremity Assessment Lower Extremity Assessment: Generalized weakness;RLE deficits/detail;LLE deficits/detail RLE Deficits / Details: grossly 3+/5, except ankle dorsiflexion 0-1/5 RLE Sensation: decreased proprioception RLE Coordination: decreased fine motor;decreased gross motor LLE Deficits / Details: grossly -4/5 LLE Sensation: WNL LLE Coordination: WNL    Cervical / Trunk Assessment Cervical / Trunk Assessment: Normal  Communication   Communication: No difficulties  Cognition Arousal/Alertness: Awake/alert Behavior During Therapy: WFL for tasks assessed/performed Overall Cognitive Status: Within Functional Limits for tasks assessed                                        General Comments      Exercises     Assessment/Plan    PT Assessment Patient needs continued PT services  PT Problem List Decreased strength;Decreased activity tolerance;Decreased balance;Decreased mobility       PT Treatment Interventions Balance training;Gait training;Stair training;Functional mobility training;Therapeutic activities;Therapeutic exercise;DME instruction;Patient/family education    PT Goals (Current goals can be found in the Care Plan section)  Acute Rehab PT Goals Patient Stated Goal: return home after rehab PT Goal Formulation: With patient Time For Goal Achievement: 03/16/20 Potential to Achieve Goals: Good    Frequency Min 3X/week   Barriers to discharge        Co-evaluation               AM-PAC PT "6 Clicks" Mobility  Outcome Measure Help needed turning from your back to your side while in a flat bed without using bedrails?: A Lot Help needed moving from lying on your back to sitting on the side of a flat bed  without using bedrails?: A Lot Help needed moving to and from a bed to a chair (including a wheelchair)?: A Lot Help needed standing up from a chair using your arms (e.g., wheelchair or bedside chair)?: A Lot Help needed to walk in hospital room?: A Lot Help needed climbing 3-5 steps with a railing? : Total 6 Click Score: 11    End of Session   Activity Tolerance: Patient tolerated treatment well;Patient limited by fatigue Patient left: in bed;with call bell/phone within reach Nurse Communication: Mobility status PT Visit Diagnosis: Unsteadiness on feet (R26.81);Other abnormalities of gait and mobility (R26.89);Muscle weakness (generalized) (M62.81)    Time: 5035-4656 PT Time Calculation (min) (ACUTE ONLY): 27 min   Charges:   PT Evaluation $PT Eval Moderate Complexity: 1 Mod PT Treatments $Therapeutic Activity: 23-37 mins        1:54 PM, 03/02/20 Ocie Bob, MPT Physical Therapist with [facility] [phone] office [phone] mobile phone

## 2020-03-02 NOTE — Progress Notes (Signed)
*  PRELIMINARY RESULTS* Echocardiogram 2D Echocardiogram has been performed.  Stacey Drain 03/02/2020, 4:31 PM

## 2020-03-02 NOTE — ED Notes (Signed)
PT at bedside.

## 2020-03-02 NOTE — Care Management Obs Status (Signed)
MEDICARE OBSERVATION STATUS NOTIFICATION   Patient Details  Name: [name] MRN: [id number] Date of Birth: 11-23-1955   Medicare Observation Status Notification Given:  Other (see comment) (CMA asked Amber, RN via chat to deliver form to room)    Corey Harold 03/02/2020, 4:06 PM

## 2020-03-02 NOTE — H&P (Signed)
History and Physical    [name] DOB: 06-Feb-1956 DOA: 03/01/2020  PCP: Celene Squibb, MD   Patient coming from: Home.  I have personally briefly reviewed patient's old medical records in [place]  Chief Complaint: Right lower extremity weakness.  HPI: Michele is a 64 y.o. female with medical history significant of hypertension, hyperlipidemia, type 2 diabetes mellitus, tobacco abuse and fibromyalgia; previous CVA on November 25, 2017 who presented to the emergency department secondary to right-sided lower extremity weakness since 2 days ago.  She also says that she has been experiencing generalized fatigue.  She denies fever, but complains of chills, malaise and decreased appetite.  Positive renal area, sore throat, dry cough and mild dyspnea.  Positive pleuritic chest pain.  She denies precordial chest pain, minimal pain, but states she has had multiple episodes of loose stools.  No dysuria, frequency or hematuria.  ED Course: Initial vital signs temperature 99.9 F, pulse 98, respiration 18, blood pressure 154/82 mmHg O2 sat 93% on room air.  A teleneurology consult was performed.  Her CBC, PT/INR/PTT were within normal limits. 730, chloride 97 mmol/L.  The rest of the electrolytes are within normal range when calcium is corrected to albumin.  Glucose 130, BUN 36 and creatinine 1.47 mg/dL.  Her COVID-19 PCR was positive.  Her chest radiograph did not show any active disease.  CT head could not exclude acute CVA and MRI suggested..  Review of Systems: As per HPI otherwise all other systems reviewed and are negative.  Past Medical History:  Diagnosis Date  . Diabetes mellitus without complication ([place])   . Diverticulitis   . Fibromyalgia   . Hyperlipidemia   . Hypertension   . Papule 03/22/2015  . Stroke [facility])    Past Surgical History:  Procedure Laterality Date  . CHOLECYSTECTOMY    . KNEE SURGERY Left    Social History  reports that she quit  smoking about 18 years ago. Her smoking use included cigarettes. She has a 57.00 pack-year smoking history. She has never used smokeless tobacco. She reports current alcohol use of about 1.0 standard drink of alcohol per week. She reports that she does not use drugs.  Allergies  Allergen Reactions  . Bee Pollen Anaphylaxis and Swelling  . Bee Venom   . Codeine     REACTION: nauseated,claustrophobic   Family History  Problem Relation Age of Onset  . Diabetes Mother   . Dementia Mother   . Hypertension Mother   . Stroke Mother   . Alcohol abuse Father   . Cirrhosis Father   . Arthritis/Rheumatoid Sister   . Diabetes Brother   . Hypertension Brother   . Diabetes Maternal Grandmother   . Fibromyalgia Sister   . Hypertension Sister   . Fibromyalgia Sister   . Hypertension Sister   . Diabetes Sister   . Hypertension Sister   . Cirrhosis Brother   . Pancreatic cancer Brother    Prior to Admission medications   Medication Sig Start Date End Date Taking? Authorizing Provider  amLODipine (NORVASC) 10 MG tablet Take 10 mg by mouth at bedtime. 03/01/18  Yes [provider]  aspirin 325 MG tablet Take 1 tablet (325 mg total) by mouth daily. 11/28/17  Yes Barton Dubois, MD  atorvastatin (LIPITOR) 40 MG tablet Take 40 mg by mouth daily. 05/05/18  Yes [provider]  carvedilol (COREG) 6.25 MG tablet Take 6.25 mg by mouth 2 (two) times daily. 01/12/20  Yes [provider]  cloNIDine (CATAPRES) 0.1 MG tablet Take 0.1 mg by mouth 2 (two) times daily. 04/25/18  Yes [provider]  diclofenac sodium (VOLTAREN) 1 % GEL Apply 3 g to 3 large joints up to 3 times a day when necessary 02/27/18  Yes Ofilia Neas, PA-C  fenofibrate 160 MG tablet Take 160 mg by mouth daily.  11/06/14  Yes [provider]  hydrALAZINE (APRESOLINE) 10 MG tablet Take 10 mg by mouth 2 (two) times daily. 04/05/18  Yes [provider]  ketoconazole (NIZORAL) 2 % cream Apply 1  application topically 2 (two) times daily. 02/05/20  Yes [provider]  lidocaine (LIDODERM) 5 % Place 1 patch onto the skin daily as needed. Apply patch to area most significant pain once per day.  Remove and discard patch within 12 hours of application. 11/14/19  Yes Petrucelli, Samantha R, PA-C  metFORMIN (GLUCOPHAGE) 1000 MG tablet Take 1 tablet (1,000 mg total) by mouth 2 (two) times daily with a meal. 11/27/17 03/01/20 Yes Barton Dubois, MD  olmesartan (BENICAR) 40 MG tablet Take 40 mg by mouth daily. 04/27/18  Yes [provider]  omega-3 acid ethyl esters (LOVAZA) 1 g capsule Take 1 capsule (1 g total) by mouth 2 (two) times daily. 11/27/17  Yes Barton Dubois, MD  simvastatin (ZOCOR) 40 MG tablet Take 40 mg by mouth daily at 6 PM.  01/30/18  Yes [provider]  [place] 100-33 UNT-MCG/ML SOPN Inject 16 Units into the skin daily before breakfast. (Use less than 1 hour before first meal of the day) 03/22/18  Yes [provider]  albuterol (PROVENTIL HFA;VENTOLIN HFA) 108 (90 Base) MCG/ACT inhaler Inhale 2 puffs into the lungs every 6 (six) hours as needed.  04/18/18   [provider]  baclofen 5 MG TABS Take 5 mg by mouth 2 (two) times daily. Start 1 tablet twice daily x 1 week then three times daily Patient not taking: Reported on 03/01/2020 05/27/18   Garvin Fila, MD  Blood Glucose Monitoring Suppl (ONETOUCH VERIO) w/Device KIT USE TO [place] DAILY 11/22/17   [provider]  hydrochlorothiazide (HYDRODIURIL) 12.5 MG tablet Take 12.5 mg by mouth daily. 02/16/20   [provider]  insulin detemir (LEVEMIR) 100 UNIT/ML injection Inject 0.3 mLs (30 Units total) into the skin daily. Patient not taking: Reported on 03/01/2020 11/28/17   Barton Dubois, MD  losartan (COZAAR) 100 MG tablet Take 100 mg by mouth daily. 01/16/18   [provider]  ONETOUCH VERIO test strip USE 1 STRIP TO [place] DAILY  11/23/17   [provider]    Physical Exam: Vitals:   03/01/20 2000 03/01/20 2119 03/01/20 2130 03/01/20 2300  BP: (!) 177/73 (!) 163/85 (!) 164/92 (!) 160/72  Pulse: (!) 102 (!) 103 (!) 102 100  Resp: 20 19 20 18   Temp:      TempSrc:      SpO2: 92% 92% 93% 95%  Weight:      Height:        Constitutional: Looks acutely ill. Eyes: PERRL, lids and conjunctivae injected. ENMT: Mucous membranes are mildly dry. Posterior pharynx clear of any exudate or lesions. Neck: normal, supple, no masses, no thyromegaly Respiratory: Bibasilar crackles, no wheezing or rhonchi. Normal respiratory effort. No accessory muscle use.  Cardiovascular: Regular rate and rhythm, no murmurs / rubs / gallops. No extremity edema. 2+ pedal pulses. No carotid bruits.  Abdomen: Obese, nondistended.  No tenderness, no masses palpated. No hepatosplenomegaly. Bowel sounds positive.  Musculoskeletal: no clubbing / cyanosis. Good ROM, no contractures.  Skin: no rashes, lesions, ulcers on very limited dermatological examination. Neurologic: Right-sided hemiparesis.  Psychiatric: Normal judgment and insight. Alert and oriented x 3.  Labs on Admission: I have personally reviewed following labs and imaging studies  CBC: Recent Labs  Lab 03/01/20 1808  WBC 5.5  NEUTROABS 3.4  HGB 12.0  HCT 36.9  MCV 92.3  PLT 016    Basic Metabolic Panel: Recent Labs  Lab 03/01/20 1808  NA 130*  K 3.8  CL 97*  CO2 23  GLUCOSE 230*  BUN 36*  CREATININE 1.47*  CALCIUM 8.6*    GFR: Estimated Creatinine Clearance: 35 mL/min (A) (by C-G formula based on SCr of 1.47 mg/dL (H)).  Liver Function Tests: Recent Labs  Lab 03/01/20 1808  AST 36  ALT 29  ALKPHOS 57  BILITOT 0.3  PROT 7.0  ALBUMIN 3.2*   Radiological Exams on Admission: CT HEAD WO CONTRAST  Result Date: 03/01/2020 CLINICAL DATA:  Neuro deficit, acute stroke suspected. Left leg weakness at X 2 days. History of previous stroke with right-sided  weakness. Per family, smile looks different. EXAM: CT HEAD WITHOUT CONTRAST TECHNIQUE: Contiguous axial images were obtained from the base of the skull through the vertex without intravenous contrast. COMPARISON:  MR head 11/26/2017. CT head 11/25/2017 FINDINGS: Brain: Patchy and confluent areas of decreased attenuation are noted throughout the deep and periventricular white matter of the cerebral hemispheres bilaterally, compatible with chronic microvascular ischemic disease. Suggestion of bilateral chronic basal ganglia lacunar infarctions. Slightly more conspicuous/possibly slightly larger in size right basal ganglia hypodensity. No evidence of large-territorial acute infarction. No parenchymal hemorrhage. No mass lesion. No extra-axial collection. No mass effect or midline shift. No hydrocephalus. Basilar cisterns are patent. Vascular: No hyperdense vessel. Atherosclerotic calcifications are present within the cavernous internal carotid arteries. Skull: No acute fracture or focal lesion. Sinuses/Orbits: Paranasal sinuses and mastoid air cells are clear. The orbits are unremarkable. Other: None. IMPRESSION: 1. Microvascular ischemic changes as well as bilateral chronic basal ganglia lacunar infarcts with possibly slightly larger right basal ganglia hypodensity. Cannot exclude overlying acute/subacute infarction. Consider noncontrast MRI for further/more sensitive evaluation. 2. No acute intracranial hemorrhage. These results were called by telephone at the time of interpretation on 03/01/2020 at 6:41 pm to provider [facility] , who verbally acknowledged these results. Electronically Signed   By: Iven Finn M.D.   On: 03/01/2020 18:39   EKG: Independently reviewed.  Vent. rate 98 BPM PR interval * ms QRS duration 79 ms QT/QTc 332/422 ms P-R-T axes 55 -32 73 Sinus rhythm Left axis deviation Abnormal R-wave progression, early transition Baseline wander Non-specific ST-t changes Since last tracing  Non-specific ST-t changes are new  Assessment/Plan Principal Problem:   Weakness of left lower extremity Neurochecks. Check hemoglobin A1c. Check fasting lipids. Check MRI of brain. Check MRA of neck. Check carotid Doppler. Check echocardiogram. Tight blood glucose control. Lower methylprednisolone to 0.25 mg/kg.  Active Problems:   Right spastic hemiplegia ([place]) Supportive care.    COVID-19 virus infection No previous coronavirus vaccination. Mildly hypoxic, but checks x-ray is clear. Monoclonal antibodies were ordered.    Hypertension Hold antihypertensives. Allow permissive hypertension.    Type 2 diabetes mellitus with hyperglycemia (HCC) Carbohydrate modified diet. Check hemoglobin A1c. CBG monitoring every 4 hours with RI SS. Tradjenta 5 mg p.o. daily.Marland Kitchen    HLD (hyperlipidemia) Continue atorvastatin 40 mg  p.o. daily.    Azotemia Chronic worsening? Acute change in the setting of Covid pneumonia.    Hyponatremia Likely due to GI losses.   DVT prophylaxis:Lovenox SQ. Code Status:Full code. Family Communication: Disposition Plan: Patient is from:Home. Anticipated DC ZO:XWRU. Anticipated DC date:03/03/2020. Anticipated DC barriers:Clinical status. Consults called: Admission status:Observation/telemetry.  Severity of Illness: High due to new stroke versus record essence of old stroke in the setting of multiple risk factors for cardiovascular disease.   Reubin Milan MD [facility]  How to contact the [facility] Attending or Consulting provider [place] or covering provider during after hours [place], for this patient?   1. Check the care team in [facility] and look for a) attending/consulting TR[facility] provider listed and b) the [facility] team listed 2. Log into [email or web address] and use [place] universal  password to access. If you do not have the password, please contact the hospital operator. 3. Locate the [facility] provider you are looking for under [facility] and page to a number that you can be directly reached. If you still have difficulty reaching the provider, please page the [facility] (Director on Call) for the Hospitalists listed on amion for assistance.  03/02/2020, 11:03 AM

## 2020-03-02 NOTE — Plan of Care (Signed)

## 2020-03-02 NOTE — Progress Notes (Signed)
Patient seen and examined. Admitted after midnight secondary to RLE weakness, general malaise and weakness. Found to be posiitve for COVID-19; no CXR abnormalities, no hypoxia and just complaining of intermittent productive coughing spells. Patient evaluated by tele-neurology and recommendations for TIA/stroke work up provided. Patient is hemodynamically stable currently. Please refer to H&P written by Dr. Robb Matar for further info/details on admission.   Plan: -will use MAB and steroids for COVID treatment and continue symptomatic management. -complete stroke work up and follow neurology rec's -follow CBG's and adjust hypoglycemic regimen.  -follow clinical response.  Vassie Loll MD [phone]

## 2020-03-02 NOTE — ED Notes (Signed)
Patient transported to MRI 

## 2020-03-02 NOTE — ED Notes (Addendum)
Pt cleaned. New brief, linens and gown applied. Pt reconnected to telemetry, BP monitor and continuous pulse ox. Purewick placed per request. Pt repositioned in bed for comfort and denied any other needs at this time. Call bell within reach, bed in low position. Will continue to monitor.

## 2020-03-02 NOTE — Plan of Care (Signed)
°  Problem: Acute Rehab PT Goals(only PT should resolve) Goal: Pt Will Go Supine/Side To Sit Outcome: Progressing Flowsheets (Taken 03/02/2020 1357) Pt will go Supine/Side to Sit: with minimal assist Goal: Patient Will Transfer Sit To/From Stand Outcome: Progressing Flowsheets (Taken 03/02/2020 1357) Patient will transfer sit to/from stand: with minimal assist Goal: Pt Will Transfer Bed To Chair/Chair To Bed Outcome: Progressing Flowsheets (Taken 03/02/2020 1357) Pt will Transfer Bed to Chair/Chair to Bed:  with min assist  with mod assist Goal: Pt Will Ambulate Outcome: Progressing Flowsheets (Taken 03/02/2020 1357) Pt will Ambulate:  25 feet  with minimal assist  with moderate assist Note: Using Hemi-walker   1:59 PM, 03/02/20 Ocie Bob, MPT Physical Therapist with [facility] 336 [phone] office [phone] mobile phone

## 2020-03-03 DIAGNOSIS — R29898 Other symptoms and signs involving the musculoskeletal system: Secondary | ICD-10-CM | POA: Diagnosis present

## 2020-03-03 DIAGNOSIS — U071 COVID-19: Secondary | ICD-10-CM | POA: Diagnosis not present

## 2020-03-03 DIAGNOSIS — J1282 Pneumonia due to coronavirus disease 2019: Secondary | ICD-10-CM | POA: Diagnosis not present

## 2020-03-03 DIAGNOSIS — Z823 Family history of stroke: Secondary | ICD-10-CM | POA: Diagnosis not present

## 2020-03-03 DIAGNOSIS — I69351 Hemiplegia and hemiparesis following cerebral infarction affecting right dominant side: Secondary | ICD-10-CM | POA: Diagnosis not present

## 2020-03-03 DIAGNOSIS — R531 Weakness: Secondary | ICD-10-CM | POA: Diagnosis not present

## 2020-03-03 DIAGNOSIS — I10 Essential (primary) hypertension: Secondary | ICD-10-CM | POA: Diagnosis not present

## 2020-03-03 DIAGNOSIS — Z8249 Family history of ischemic heart disease and other diseases of the circulatory system: Secondary | ICD-10-CM | POA: Diagnosis not present

## 2020-03-03 DIAGNOSIS — Z885 Allergy status to narcotic agent status: Secondary | ICD-10-CM | POA: Diagnosis not present

## 2020-03-03 DIAGNOSIS — Z7984 Long term (current) use of oral hypoglycemic drugs: Secondary | ICD-10-CM | POA: Diagnosis not present

## 2020-03-03 DIAGNOSIS — M797 Fibromyalgia: Secondary | ICD-10-CM | POA: Diagnosis not present

## 2020-03-03 DIAGNOSIS — Z91030 Bee allergy status: Secondary | ICD-10-CM | POA: Diagnosis not present

## 2020-03-03 DIAGNOSIS — E785 Hyperlipidemia, unspecified: Secondary | ICD-10-CM | POA: Diagnosis not present

## 2020-03-03 DIAGNOSIS — E860 Dehydration: Secondary | ICD-10-CM | POA: Diagnosis not present

## 2020-03-03 DIAGNOSIS — Z8616 Personal history of COVID-19: Secondary | ICD-10-CM | POA: Diagnosis not present

## 2020-03-03 DIAGNOSIS — E1165 Type 2 diabetes mellitus with hyperglycemia: Secondary | ICD-10-CM | POA: Diagnosis not present

## 2020-03-03 DIAGNOSIS — Z79899 Other long term (current) drug therapy: Secondary | ICD-10-CM | POA: Diagnosis not present

## 2020-03-03 DIAGNOSIS — E119 Type 2 diabetes mellitus without complications: Secondary | ICD-10-CM | POA: Diagnosis not present

## 2020-03-03 DIAGNOSIS — E871 Hypo-osmolality and hyponatremia: Secondary | ICD-10-CM | POA: Diagnosis not present

## 2020-03-03 DIAGNOSIS — M6281 Muscle weakness (generalized): Secondary | ICD-10-CM | POA: Diagnosis not present

## 2020-03-03 DIAGNOSIS — G8111 Spastic hemiplegia affecting right dominant side: Secondary | ICD-10-CM | POA: Diagnosis not present

## 2020-03-03 DIAGNOSIS — Z7982 Long term (current) use of aspirin: Secondary | ICD-10-CM | POA: Diagnosis not present

## 2020-03-03 DIAGNOSIS — R1312 Dysphagia, oropharyngeal phase: Secondary | ICD-10-CM | POA: Diagnosis not present

## 2020-03-03 DIAGNOSIS — R0902 Hypoxemia: Secondary | ICD-10-CM | POA: Diagnosis not present

## 2020-03-03 DIAGNOSIS — Z833 Family history of diabetes mellitus: Secondary | ICD-10-CM | POA: Diagnosis not present

## 2020-03-03 DIAGNOSIS — I69320 Aphasia following cerebral infarction: Secondary | ICD-10-CM | POA: Diagnosis not present

## 2020-03-03 LAB — CBC WITH DIFFERENTIAL/PLATELET
Abs Immature Granulocytes: 0.01 10*3/uL (ref 0.00–0.07)
Basophils Absolute: 0 10*3/uL (ref 0.0–0.1)
Basophils Relative: 0 %
Eosinophils Absolute: 0 10*3/uL (ref 0.0–0.5)
Eosinophils Relative: 1 %
HCT: 34.1 % — ABNORMAL LOW (ref 36.0–46.0)
Hemoglobin: 11.2 g/dL — ABNORMAL LOW (ref 12.0–15.0)
Immature Granulocytes: 0 %
Lymphocytes Relative: 39 %
Lymphs Abs: 1.9 10*3/uL (ref 0.7–4.0)
MCH: 30.4 pg (ref 26.0–34.0)
MCHC: 32.8 g/dL (ref 30.0–36.0)
MCV: 92.7 fL (ref 80.0–100.0)
Monocytes Absolute: 0.3 10*3/uL (ref 0.1–1.0)
Monocytes Relative: 6 %
Neutro Abs: 2.7 10*3/uL (ref 1.7–7.7)
Neutrophils Relative %: 54 %
Platelets: 160 10*3/uL (ref 150–400)
RBC: 3.68 MIL/uL — ABNORMAL LOW (ref 3.87–5.11)
RDW: 14.1 % (ref 11.5–15.5)
WBC: 4.9 10*3/uL (ref 4.0–10.5)
nRBC: 0 % (ref 0.0–0.2)

## 2020-03-03 LAB — GLUCOSE, CAPILLARY
Glucose-Capillary: 173 mg/dL — ABNORMAL HIGH (ref 70–99)
Glucose-Capillary: 149 mg/dL — ABNORMAL HIGH (ref 70–99)
Glucose-Capillary: 183 mg/dL — ABNORMAL HIGH (ref 70–99)
Glucose-Capillary: 160 mg/dL — ABNORMAL HIGH (ref 70–99)
Glucose-Capillary: 140 mg/dL — ABNORMAL HIGH (ref 70–99)

## 2020-03-03 LAB — COMPREHENSIVE METABOLIC PANEL
ALT: 24 U/L (ref 0–44)
AST: 32 U/L (ref 15–41)
Albumin: 2.7 g/dL — ABNORMAL LOW (ref 3.5–5.0)
Alkaline Phosphatase: 49 U/L (ref 38–126)
Anion gap: 10 (ref 5–15)
BUN: 39 mg/dL — ABNORMAL HIGH (ref 8–23)
CO2: 21 mmol/L — ABNORMAL LOW (ref 22–32)
Calcium: 8.1 mg/dL — ABNORMAL LOW (ref 8.9–10.3)
Chloride: 101 mmol/L (ref 98–111)
Creatinine, Ser: 1.37 mg/dL — ABNORMAL HIGH (ref 0.44–1.00)
GFR, Estimated: 43 mL/min — ABNORMAL LOW (ref >60)
Glucose, Bld: 173 mg/dL — ABNORMAL HIGH (ref 70–99)
Potassium: 3.8 mmol/L (ref 3.5–5.1)
Sodium: 132 mmol/L — ABNORMAL LOW (ref 135–145)
Total Bilirubin: 0.4 mg/dL (ref 0.3–1.2)
Total Protein: 6.1 g/dL — ABNORMAL LOW (ref 6.5–8.1)

## 2020-03-03 LAB — FERRITIN: Ferritin: 114 ng/mL (ref 11–307)

## 2020-03-03 LAB — C-REACTIVE PROTEIN: CRP: 1.5 mg/dL — ABNORMAL HIGH (ref <1.0)

## 2020-03-03 LAB — D-DIMER, QUANTITATIVE: D-Dimer, Quant: 0.61 ug/mL-FEU — ABNORMAL HIGH (ref 0.00–0.50)

## 2020-03-03 LAB — PROCALCITONIN: Procalcitonin: 0.13 ng/mL

## 2020-03-03 MED ORDER — ALBUTEROL SULFATE HFA 108 (90 BASE) MCG/ACT IN AERS
2.0000 | INHALATION_SPRAY | Freq: Three times a day (TID) | RESPIRATORY_TRACT | Status: DC
  Administered 2020-03-04: 2 via RESPIRATORY_TRACT

## 2020-03-03 MED ORDER — SODIUM CHLORIDE 0.9 % IV SOLN: INTRAVENOUS | Status: AC

## 2020-03-03 NOTE — NC FL2 (Signed)
MEDICAID FL2 LEVEL OF CARE SCREENING TOOL     IDENTIFICATION  Patient Name: [name] Birthdate: Sep 03, 1955 Sex: female Admission Date (Current Location): 03/01/2020  [place] [facility] and [place] Number:  [employer] and Address:  [facility],  [address], [place] [address]      Provider Number: [id number]  Attending Physician Name and Address:  Erick Blinks, DO  Relative Name and Phone Number:  Azuree, Minish (Spouse) [phone] [facility])    Current Level of Care: Hospital Recommended Level of Care: Skilled Nursing Facility Prior Approval Number:    Date Approved/Denied:   PASRR Number: [id number]  Discharge Plan: SNF    Current Diagnoses: Patient Active Problem List   Diagnosis Date Noted  . Weakness of extremity 03/03/2020  . COVID-19 virus infection 03/02/2020  . Weakness of left lower extremity 03/01/2020  . Azotemia 03/01/2020  . Hyponatremia 03/01/2020  . Right spastic hemiplegia (HCC) 05/28/2018  . Hypothyroidism 11/29/2017  . Cerebrovascular accident (CVA) (HCC)   . Ischemic stroke (HCC)   . Type 2 diabetes mellitus with hyperglycemia (HCC) 11/25/2017  . HLD (hyperlipidemia) 11/25/2017  . Fibromyalgia 11/25/2017  . Depression 11/25/2017  . Tobacco abuse 11/25/2017  . Papule 03/22/2015  . Hypertension   . Acute diverticulitis 11/08/2014  . Diabetes (HCC) 11/08/2014  . Renal insufficiency 11/08/2014  . Abdominal pain 11/08/2014  . Left leg weakness 04/10/2011  . Abnormal gait 04/10/2011  . S/P total knee replacement 12/28/2010  . Knee pain 12/28/2010    Orientation RESPIRATION BLADDER Height & Weight     Self, Time, Situation, Place  Normal Incontinent Weight: 147 lb (66.7 kg) Height:  5\' 3"  (160 cm)  BEHAVIORAL SYMPTOMS/MOOD NEUROLOGICAL BOWEL NUTRITION STATUS      Continent Diet  AMBULATORY STATUS COMMUNICATION OF NEEDS Skin   Limited Assist Verbally Normal                       Personal  Care Assistance Level of Assistance  Bathing, Feeding, Dressing Bathing Assistance: Limited assistance Feeding assistance: Independent Dressing Assistance: Limited assistance     Functional Limitations Info  Sight, Hearing, Speech Sight Info: Adequate Hearing Info: Adequate Speech Info: Adequate    SPECIAL CARE FACTORS FREQUENCY  PT (By licensed PT), OT (By licensed OT)     PT Frequency: 5x/week OT Frequency: 3x/week            Contractures Contractures Info: Not present    Additional Factors Info  Code Status, Allergies, Isolation Precautions Code Status Info: Full Code Allergies Info: Bee Pollen, Bee Venom, Codeine     Isolation Precautions Info: COVID+ 03/01/20     Current Medications (03/03/2020):  This is the current hospital active medication list Current Facility-Administered Medications  Medication Dose Route Frequency Provider Last Rate Last Admin  . 0.9 %  sodium chloride infusion   Intravenous Continuous 13/06/2019 D, DO 75 mL/hr at 03/03/20 1125 New Bag at 03/03/20 1125  . acetaminophen (TYLENOL) tablet 650 mg  650 mg Oral Q6H PRN 13/03/21, MD   650 mg at 03/03/20 13/03/21   Or  . acetaminophen (TYLENOL) suppository 650 mg  650 mg Rectal Q6H PRN [id number], MD      . albuterol (VENTOLIN HFA) 108 (90 Base) MCG/ACT inhaler 2 puff  2 puff Inhalation Q6H PRN Bobette Mo, MD      . albuterol (VENTOLIN HFA) 108 (90 Base) MCG/ACT inhaler 2 puff  2 puff  Inhalation Q6H Bobette Mo, MD   2 puff at 03/03/20 0813  . atorvastatin (LIPITOR) tablet 40 mg  40 mg Oral Daily Bobette Mo, MD   40 mg at 03/03/20 0945  . baclofen (LIORESAL) tablet 5 mg  5 mg Oral BID Bobette Mo, MD   5 mg at 03/03/20 0945  . casirivimab-imdevimab (REGEN-COV) 1,200 mg in sodium chloride 0.9 % 110 mL IVPB  1,200 mg Intravenous Once Bobette Mo, MD      . diphenhydrAMINE (BENADRYL) injection 50 mg  50 mg Intravenous Once PRN Bobette Mo, MD      . enoxaparin (LOVENOX) injection 40 mg  40 mg Subcutaneous Q24H Bobette Mo, MD   40 mg at 03/02/20 2317  . EPINEPHrine (EPI-PEN) injection 0.3 mg  0.3 mg Intramuscular Once PRN Bobette Mo, MD      . famotidine (PEPCID) IVPB 20 mg premix  20 mg Intravenous Once PRN Bobette Mo, MD      . fenofibrate tablet 160 mg  160 mg Oral Daily Bobette Mo, MD   160 mg at 03/03/20 0945  . guaiFENesin-dextromethorphan (ROBITUSSIN DM) 100-10 MG/5ML syrup 10 mL  10 mL Oral Q4H PRN Bobette Mo, MD      . insulin aspart (novoLOG) injection 0-20 Units  0-20 Units Subcutaneous TID WC Bobette Mo, MD   4 Units at 03/03/20 1206  . insulin glargine (LANTUS) injection 12 Units  12 Units Subcutaneous BID Vassie Loll, MD   12 Units at 03/03/20 0950  . linagliptin (TRADJENTA) tablet 5 mg  5 mg Oral Daily Bobette Mo, MD   5 mg at 03/03/20 0945  . methylPREDNISolone sodium succinate (SOLU-MEDROL) 125 mg/2 mL injection 125 mg  125 mg Intravenous Once PRN Bobette Mo, MD      . omega-3 acid ethyl esters (LOVAZA) capsule 1 g  1 g Oral BID Bobette Mo, MD   1 g at 03/03/20 0946  . ondansetron (ZOFRAN) tablet 4 mg  4 mg Oral Q6H PRN Bobette Mo, MD       Or  . ondansetron [facility]) injection 4 mg  4 mg Intravenous Q6H PRN Bobette Mo, MD         Discharge Medications: Please see discharge summary for a list of discharge medications.  Relevant Imaging Results:  Relevant Lab Results:   Additional Information SSN [id number], Juleen China, LCSW

## 2020-03-03 NOTE — Progress Notes (Signed)
Per Ronald Pippins, pt is now agreeable to go to [facility] once stable for DC. Heather, SW updated.

## 2020-03-03 NOTE — Plan of Care (Signed)
°  Problem: Education: Goal: Knowledge of General Education information will improve Description: Including pain rating scale, medication(s)/side effects and non-pharmacologic comfort measures Outcome: Progressing   Problem: Health Behavior/Discharge Planning: Goal: Ability to manage health-related needs will improve Outcome: Progressing   Problem: Clinical Measurements: Goal: Ability to maintain clinical measurements within normal limits will improve Outcome: Progressing   Problem: Education: Goal: Knowledge of disease or condition will improve Outcome: Progressing Goal: Knowledge of secondary prevention will improve Outcome: Progressing   Problem: Coping: Goal: Will verbalize positive feelings about self Outcome: Progressing Goal: Will identify appropriate support needs Outcome: Progressing   Problem: Education: Goal: Knowledge of risk factors and measures for prevention of condition will improve Outcome: Progressing   Problem: Coping: Goal: Psychosocial and spiritual needs will be supported Outcome: Progressing

## 2020-03-03 NOTE — Progress Notes (Signed)
   03/03/20 0403  Assess: MEWS Score  Temp (!) 102.2 F (39 C)  BP (!) 143/68  Pulse Rate 97  Resp 20  SpO2 95 %  O2 Device Room Air  Assess: MEWS Score  MEWS Temp 2  MEWS Systolic 0  MEWS Pulse 0  MEWS RR 0  MEWS LOC 0  MEWS Score 2  MEWS Score Color Yellow  Assess: if the MEWS score is Yellow or Red  Were vital signs taken at a resting state? Yes  Focused Assessment No change from prior assessment  Early Detection of Sepsis Score *See Row Information* Low  MEWS guidelines implemented *See Row Information* Yes  Treat  MEWS Interventions Administered prn meds/treatments  Pain Scale 0-10  Pain Score 0  Take Vital Signs  Increase Vital Sign Frequency  Yellow: Q 2hr X 2 then Q 4hr X 2, if remains yellow, continue Q 4hrs  Escalate  MEWS: Escalate Yellow: discuss with charge nurse/RN and consider discussing with provider and RRT  Notify: Charge Nurse/RN  Name of Charge Nurse/RN Notified Wendi Maya RN  Date Charge Nurse/RN Notified 03/03/20  Time Charge Nurse/RN Notified 0410  Notify: Provider  Provider Name/Title Dr. Robb Matar  Date Provider Notified 03/03/20  Time Provider Notified [phone]  Notification Type Page  Notification Reason Other (Comment) (FYI)  Response No new orders  Date of Provider Response 03/03/20  Time of Provider Response [phone]

## 2020-03-03 NOTE — Progress Notes (Signed)
   03/03/20 0600  Assess: MEWS Score  Temp 98.4 F (36.9 C)  BP 121/73  Pulse Rate 94  Resp 18  Level of Consciousness Alert  SpO2 94 %  O2 Device Room Air  Assess: MEWS Score  MEWS Temp 0  MEWS Systolic 0  MEWS Pulse 0  MEWS RR 0  MEWS LOC 0  MEWS Score 0  MEWS Score Color Green  Assess: if the MEWS score is Yellow or Red  Were vital signs taken at a resting state? Yes  Focused Assessment No change from prior assessment  Early Detection of Sepsis Score *See Row Information* Low  MEWS guidelines implemented *See Row Information* No, previously yellow, continue vital signs every 4 hours  Document  Patient Outcome Stabilized after interventions

## 2020-03-03 NOTE — Progress Notes (Signed)
PROGRESS NOTE    [place] T Schaef[place]  YQM:[id number] DOB: 07/08/1955 DOA: 03/01/2020 PCP: Benita StabileHall, John Z, MD   Brief Narrative:  Per HPI: [name] is a 64 y.o. female with medical history significant of hypertension, hyperlipidemia,type 2 diabetes mellitus, tobacco abuse and fibromyalgia; previous CVA on November 25, 2017 who presented to the emergency department secondary to right-sided lower extremity weakness since 2 days ago.  She also says that she has been experiencing generalized fatigue.  She denies fever, but complains of chills, malaise and decreased appetite.  Positive renal area, sore throat, dry cough and mild dyspnea.  Positive pleuritic chest pain.  She denies precordial chest pain, minimal pain, but states she has had multiple episodes of loose stools.  No dysuria, frequency or hematuria.  11/3: Patient was admitted with right lower extremity weakness that appears to be acute on chronic.  She appears to have right spastic hemiplegia to this area.  She was also noted to have COVID-19 viral infection, but was asymptomatic and was given monoclonal antibodies.  She is overall doing well this morning.  MRI of the brain with no acute findings.  She did have a fever this morning.  Assessment & Plan:   Principal Problem:   Weakness of left lower extremity Active Problems:   Hypertension   Type 2 diabetes mellitus with hyperglycemia (HCC)   HLD (hyperlipidemia)   Right spastic hemiplegia (HCC)   Azotemia   Hyponatremia   COVID-19 virus infection   Weakness of extremity   Right lower extremity weakness acute on chronic -In setting of right spastic hemiplegia with prior CVA in 2019 -Brain MRI and angiogram with no acute findings of CVA or LVO -2D echocardiogram with LVEF 70-75% with grade 1 diastolic dysfunction -LDL 87 and patient currently on statin -Hemoglobin A1c 8.1% -Continue on aspirin 81 mg daily  COVID-19 virus infection -No prior vaccination -No hypoxemia and  chest x-ray clear -Given monoclonal antibody 11/2  Fever -Likely related to COVID-19 virus infection with monoclonal antibody infusion on 11/2 -Continue to monitor for another 24 hours -Recheck procalcitonin and blood cultures  Mild hyponatremia -Likely related to some dehydration with GI losses and home HCTZ use which will be avoided -IV normal saline and recheck labs in a.m.  Hypertension-controlled -Resume antihypertensives and monitor  Type 2 diabetes -Hyperglycemia improved -Hemoglobin A1c 8.1% -Continue SSI and Tradjenta  Dyslipidemia -LDL 87 with prior CVA noted -Continue atorvastatin 40 mg p.o. daily   DVT prophylaxis: Lovenox Code Status: Full code Family Communication: Discussed with husband on phone 11/3 Disposition Plan:  Status is: Inpatient  Remains inpatient appropriate because:IV treatments appropriate due to intensity of illness or inability to take PO and Inpatient level of care appropriate due to severity of illness   Dispo: The patient is from: Home              Anticipated d/c is to: Home              Anticipated d/c date is: 1 day              Patient currently is not medically stable to d/c.  She was noted to have fever this a.m. and requires further monitoring.   Consultants:   None  Procedures:   See below  Antimicrobials:  Anti-infectives (From admission, onward)   None       Subjective: Patient seen and evaluated today with no new acute complaints or concerns. No acute concerns or events noted overnight.  She states she feels well, but was noted to have a fever this morning.  She refuses SNF or home health rehab.  She states her right lower extremity weakness has improved.  Objective: Vitals:   03/03/20 0138 03/03/20 0403 03/03/20 0600 03/03/20 0814  BP:  (!) 143/68 121/73   Pulse:  97 94   Resp:  20 18   Temp:  (!) 102.2 F (39 C) 98.4 F (36.9 C)   TempSrc:  Oral Oral   SpO2: 93% 95% 94% 93%  Weight:      Height:         Intake/Output Summary (Last 24 hours) at 03/03/2020 1027 Last data filed at 03/03/2020 0600 Gross per 24 hour  Intake 109.05 ml  Output 500 ml  Net -390.95 ml   Filed Weights   03/01/20 1616  Weight: 66.7 kg    Examination:  General exam: Appears calm and comfortable  Respiratory system: Clear to auscultation. Respiratory effort normal. Cardiovascular system: S1 & S2 heard, RRR. Gastrointestinal system: Abdomen is nondistended, soft and nontender.  Central nervous system: Alert and oriented.  Right leg motor strength less than left Extremities: No edema Skin: No rashes, lesions or ulcers Psychiatry: Judgement and insight appear normal. Mood & affect appropriate.     Data Reviewed: I have personally reviewed following labs and imaging studies  CBC: Recent Labs  Lab 03/01/20 1808 03/03/20 0612  WBC 5.5 4.9  NEUTROABS 3.4 2.7  HGB 12.0 11.2*  HCT 36.9 34.1*  MCV 92.3 92.7  PLT 181 160   Basic Metabolic Panel: Recent Labs  Lab 03/01/20 1808 03/03/20 0612  NA 130* 132*  K 3.8 3.8  CL 97* 101  CO2 23 21*  GLUCOSE 230* 173*  BUN 36* 39*  CREATININE 1.47* 1.37*  CALCIUM 8.6* 8.1*  MG 1.6*  --   PHOS 3.0  --    GFR: Estimated Creatinine Clearance: 37.5 mL/min (A) (by C-G formula based on SCr of 1.37 mg/dL (H)). Liver Function Tests: Recent Labs  Lab 03/01/20 1808 03/03/20 0612  AST 36 32  ALT 29 24  ALKPHOS 57 49  BILITOT 0.3 0.4  PROT 7.0 6.1*  ALBUMIN 3.2* 2.7*   No results for input(s): LIPASE, AMYLASE in the last 168 hours. No results for input(s): AMMONIA in the last 168 hours. Coagulation Profile: Recent Labs  Lab 03/01/20 1808  INR 0.9   Cardiac Enzymes: No results for input(s): CKTOTAL, CKMB, CKMBINDEX, TROPONINI in the last 168 hours. BNP (last 3 results) No results for input(s): PROBNP in the last 8760 hours. HbA1C: Recent Labs    03/01/20 1808  HGBA1C 8.1*   CBG: Recent Labs  Lab 03/02/20 1633 03/02/20 2027  03/02/20 2358 03/03/20 0359 03/03/20 0757  GLUCAP 159* 148* 173* 173* 149*   Lipid Profile: Recent Labs    03/02/20 0248  CHOL 164  HDL 23*  LDLCALC 87  TRIG 629*  CHOLHDL 7.1   Thyroid Function Tests: No results for input(s): TSH, T4TOTAL, FREET4, T3FREE, THYROIDAB in the last 72 hours. Anemia Panel: Recent Labs    03/02/20 0248 03/03/20 0612  FERRITIN 92 114   Sepsis Labs: Recent Labs  Lab 03/02/20 0248  PROCALCITON 0.13    Recent Results (from the past 240 hour(s))  Respiratory Panel by RT PCR (Flu A&B, Covid) - Nasopharyngeal Swab     Status: Abnormal   Collection Time: 03/01/20  9:27 PM   Specimen: Nasopharyngeal Swab  Result Value Ref Range Status   SARS  Coronavirus 2 by RT PCR POSITIVE (A) NEGATIVE Final    Comment: RESULT CALLED TO, READ BACK BY AND VERIFIED WITH: K WATLINGTON,RN@0117  03/02/20 MKELLY (NOTE) SARS-CoV-2 target nucleic acids are DETECTED.  SARS-CoV-2 RNA is generally detectable in upper respiratory specimens  during the acute phase of infection. Positive results are indicative of the presence of the identified virus, but do not rule out bacterial infection or co-infection with other pathogens not detected by the test. Clinical correlation with patient history and other diagnostic information is necessary to determine patient infection status. The expected result is Negative.  Fact Sheet for Patients:  https://www.moore.com/  Fact Sheet for Healthcare Providers: https://www.young.biz/  This test is not yet approved or cleared by the [place] FDA and  has been authorized for detection and/or diagnosis of SARS-CoV-2 by FDA under an Emergency Use Authorization (EUA).  This EUA will remain in effect (meaning this test can b e used) for the duration of  the COVID-19 declaration under Section 564(b)(1) of the Act, 21 U.S.C. section 360bbb-3(b)(1), unless the authorization is terminated or revoked  sooner.      Influenza A by PCR NEGATIVE NEGATIVE Final   Influenza B by PCR NEGATIVE NEGATIVE Final    Comment: (NOTE) The Xpert Xpress SARS-CoV-2/FLU/RSV assay is intended as an aid in  the diagnosis of influenza from Nasopharyngeal swab specimens and  should not be used as a sole basis for treatment. Nasal washings and  aspirates are unacceptable for Xpert Xpress SARS-CoV-2/FLU/RSV  testing.  Fact Sheet for Patients: https://www.moore.com/  Fact Sheet for Healthcare Providers: https://www.young.biz/  This test is not yet approved or cleared by the [place] FDA and  has been authorized for detection and/or diagnosis of SARS-CoV-2 by  FDA under an Emergency Use Authorization (EUA). This EUA will remain  in effect (meaning this test can be used) for the duration of the  Covid-19 declaration under Section 564(b)(1) of the Act, 21  U.S.C. section 360bbb-3(b)(1), unless the authorization is  terminated or revoked. Performed at [facility], [address] [place], [place] [address]          Radiology Studies: CT HEAD WO CONTRAST  Result Date: 03/01/2020 CLINICAL DATA:  Neuro deficit, acute stroke suspected. Left leg weakness at X 2 days. History of previous stroke with right-sided weakness. Per family, smile looks different. EXAM: CT HEAD WITHOUT CONTRAST TECHNIQUE: Contiguous axial images were obtained from the base of the skull through the vertex without intravenous contrast. COMPARISON:  MR head 11/26/2017. CT head 11/25/2017 FINDINGS: Brain: Patchy and confluent areas of decreased attenuation are noted throughout the deep and periventricular white matter of the cerebral hemispheres bilaterally, compatible with chronic microvascular ischemic disease. Suggestion of bilateral chronic basal ganglia lacunar infarctions. Slightly more conspicuous/possibly slightly larger in size right basal ganglia hypodensity. No evidence of  large-territorial acute infarction. No parenchymal hemorrhage. No mass lesion. No extra-axial collection. No mass effect or midline shift. No hydrocephalus. Basilar cisterns are patent. Vascular: No hyperdense vessel. Atherosclerotic calcifications are present within the cavernous internal carotid arteries. Skull: No acute fracture or focal lesion. Sinuses/Orbits: Paranasal sinuses and mastoid air cells are clear. The orbits are unremarkable. Other: None. IMPRESSION: 1. Microvascular ischemic changes as well as bilateral chronic basal ganglia lacunar infarcts with possibly slightly larger right basal ganglia hypodensity. Cannot exclude overlying acute/subacute infarction. Consider noncontrast MRI for further/more sensitive evaluation. 2. No acute intracranial hemorrhage. These results were called by telephone at the time of interpretation on 03/01/2020 at 6:41 pm  to provider Mancel Bale , who verbally acknowledged these results. Electronically Signed   By: Tish Frederickson M.D.   On: 03/01/2020 18:39   MR ANGIO HEAD WO CONTRAST  Result Date: 03/02/2020 CLINICAL DATA:  Neuro deficit, acute stroke suspected. Left leg weakness for 2 days. EXAM: MRI HEAD WITHOUT CONTRAST MRA HEAD WITHOUT CONTRAST MRA NECK WITH AND WITHOUT CONTRAST TECHNIQUE: Multiplanar, multiecho pulse sequences of the brain and surrounding structures were obtained without intravenous contrast. Angiographic images of the Circle of Willis were obtained using MRA technique without intravenous contrast. Angiographic images of the neck were obtained using MRA technique with and without intravenous contrast. Carotid stenosis measurements (when applicable) are obtained utilizing NASCET criteria, using the distal internal carotid diameter as the denominator. COMPARISON:  CT head and ultrasound of the carotids from the same day. MRI/MRA from 11/26/2017. FINDINGS: MRI HEAD FINDINGS Brain: No acute infarct. Multiple remote lacunar infarcts involving  bilateral basal ganglia and thalami. Scattered T2/FLAIR hyperintensities throughout the white matter and pons, most consistent with chronic microvascular ischemic disease. No acute hemorrhage. No hydrocephalus. No mass lesion or abnormal mass effect. Skull and upper cervical spine: Normal marrow signal. Sinuses/Orbits: Scattered paranasal sinus mucosal thickening with opacification of ethmoid air cells. Unremarkable orbits. Other: Left mastoid effusion. MRA HEAD FINDINGS The intracranial vertebral arteries are patent in Co dominant. The basilar artery is patent. Similar large right posterior communicating artery. The PCAs are patent with similar multifocal severe P2 PCA stenoses bilaterally. There is apparent fenestration of the right P2 PCA. The internal carotid arteries are patent from the skull base to the carotid termini ACA and MCAs are patent without evidence of significant M1 or A1 stenosis. Suspect at least moderate right proximal M2 MCA stenosis. Similar severe mid left MCA stenosis. The distal ACAs are poorly visualized; however, likely similar moderate left A2 ACA stenosis. MRA NECK FINDINGS Evaluation is limited by patient motion. Nondiagnostic evaluation of the vertebral artery origins. Narrowing at bilateral carotid bifurcations with the degree of narrowing better characterized on same day carotid ultrasound. IMPRESSION: MRI head: 1. No acute intracranial abnormality. Specifically, no acute infarct. 2. Multiple remote lacunar infarcts and chronic microvascular ischemic disease, as detailed above. MRA head/neck: 1. No large vessel occlusion intracranially. Multifocal intracranial atherosclerosis (as detailed above) including severe bilateral P2 PCA stenoses. 2. No specific evidence of hemodynamically significant stenosis or occlusion in the neck, although evaluation is limited by motion (including nondiagnostic evaluation of the vertebral artery origins). CTA could further evaluate if clinically  indicated. 3. See same day ultrasound of the carotids for evaluation of the carotid bifurcations. Electronically Signed   By: Feliberto Harts MD   On: 03/02/2020 12:17   MR ANGIO NECK W WO CONTRAST  Result Date: 03/02/2020 CLINICAL DATA:  Neuro deficit, acute stroke suspected. Left leg weakness for 2 days. EXAM: MRI HEAD WITHOUT CONTRAST MRA HEAD WITHOUT CONTRAST MRA NECK WITH AND WITHOUT CONTRAST TECHNIQUE: Multiplanar, multiecho pulse sequences of the brain and surrounding structures were obtained without intravenous contrast. Angiographic images of the Circle of Willis were obtained using MRA technique without intravenous contrast. Angiographic images of the neck were obtained using MRA technique with and without intravenous contrast. Carotid stenosis measurements (when applicable) are obtained utilizing NASCET criteria, using the distal internal carotid diameter as the denominator. COMPARISON:  CT head and ultrasound of the carotids from the same day. MRI/MRA from 11/26/2017. FINDINGS: MRI HEAD FINDINGS Brain: No acute infarct. Multiple remote lacunar infarcts involving bilateral basal ganglia and thalami. Scattered  T2/FLAIR hyperintensities throughout the white matter and pons, most consistent with chronic microvascular ischemic disease. No acute hemorrhage. No hydrocephalus. No mass lesion or abnormal mass effect. Skull and upper cervical spine: Normal marrow signal. Sinuses/Orbits: Scattered paranasal sinus mucosal thickening with opacification of ethmoid air cells. Unremarkable orbits. Other: Left mastoid effusion. MRA HEAD FINDINGS The intracranial vertebral arteries are patent in Co dominant. The basilar artery is patent. Similar large right posterior communicating artery. The PCAs are patent with similar multifocal severe P2 PCA stenoses bilaterally. There is apparent fenestration of the right P2 PCA. The internal carotid arteries are patent from the skull base to the carotid termini ACA and MCAs  are patent without evidence of significant M1 or A1 stenosis. Suspect at least moderate right proximal M2 MCA stenosis. Similar severe mid left MCA stenosis. The distal ACAs are poorly visualized; however, likely similar moderate left A2 ACA stenosis. MRA NECK FINDINGS Evaluation is limited by patient motion. Nondiagnostic evaluation of the vertebral artery origins. Narrowing at bilateral carotid bifurcations with the degree of narrowing better characterized on same day carotid ultrasound. IMPRESSION: MRI head: 1. No acute intracranial abnormality. Specifically, no acute infarct. 2. Multiple remote lacunar infarcts and chronic microvascular ischemic disease, as detailed above. MRA head/neck: 1. No large vessel occlusion intracranially. Multifocal intracranial atherosclerosis (as detailed above) including severe bilateral P2 PCA stenoses. 2. No specific evidence of hemodynamically significant stenosis or occlusion in the neck, although evaluation is limited by motion (including nondiagnostic evaluation of the vertebral artery origins). CTA could further evaluate if clinically indicated. 3. See same day ultrasound of the carotids for evaluation of the carotid bifurcations. Electronically Signed   By: Feliberto Harts MD   On: 03/02/2020 12:17   MR BRAIN WO CONTRAST  Result Date: 03/02/2020 CLINICAL DATA:  Neuro deficit, acute stroke suspected. Left leg weakness for 2 days. EXAM: MRI HEAD WITHOUT CONTRAST MRA HEAD WITHOUT CONTRAST MRA NECK WITH AND WITHOUT CONTRAST TECHNIQUE: Multiplanar, multiecho pulse sequences of the brain and surrounding structures were obtained without intravenous contrast. Angiographic images of the Circle of Willis were obtained using MRA technique without intravenous contrast. Angiographic images of the neck were obtained using MRA technique with and without intravenous contrast. Carotid stenosis measurements (when applicable) are obtained utilizing NASCET criteria, using the distal  internal carotid diameter as the denominator. COMPARISON:  CT head and ultrasound of the carotids from the same day. MRI/MRA from 11/26/2017. FINDINGS: MRI HEAD FINDINGS Brain: No acute infarct. Multiple remote lacunar infarcts involving bilateral basal ganglia and thalami. Scattered T2/FLAIR hyperintensities throughout the white matter and pons, most consistent with chronic microvascular ischemic disease. No acute hemorrhage. No hydrocephalus. No mass lesion or abnormal mass effect. Skull and upper cervical spine: Normal marrow signal. Sinuses/Orbits: Scattered paranasal sinus mucosal thickening with opacification of ethmoid air cells. Unremarkable orbits. Other: Left mastoid effusion. MRA HEAD FINDINGS The intracranial vertebral arteries are patent in Co dominant. The basilar artery is patent. Similar large right posterior communicating artery. The PCAs are patent with similar multifocal severe P2 PCA stenoses bilaterally. There is apparent fenestration of the right P2 PCA. The internal carotid arteries are patent from the skull base to the carotid termini ACA and MCAs are patent without evidence of significant M1 or A1 stenosis. Suspect at least moderate right proximal M2 MCA stenosis. Similar severe mid left MCA stenosis. The distal ACAs are poorly visualized; however, likely similar moderate left A2 ACA stenosis. MRA NECK FINDINGS Evaluation is limited by patient motion. Nondiagnostic evaluation of the  vertebral artery origins. Narrowing at bilateral carotid bifurcations with the degree of narrowing better characterized on same day carotid ultrasound. IMPRESSION: MRI head: 1. No acute intracranial abnormality. Specifically, no acute infarct. 2. Multiple remote lacunar infarcts and chronic microvascular ischemic disease, as detailed above. MRA head/neck: 1. No large vessel occlusion intracranially. Multifocal intracranial atherosclerosis (as detailed above) including severe bilateral P2 PCA stenoses. 2. No  specific evidence of hemodynamically significant stenosis or occlusion in the neck, although evaluation is limited by motion (including nondiagnostic evaluation of the vertebral artery origins). CTA could further evaluate if clinically indicated. 3. See same day ultrasound of the carotids for evaluation of the carotid bifurcations. Electronically Signed   By: Feliberto Harts MD   On: 03/02/2020 12:17   US Carotid Bilateral (at [facility] and [facility] only)  Result Date: 03/02/2020 CLINICAL DATA:  Right lower extremity weakness, hyperlipidemia, diabetes and tobacco use EXAM: BILATERAL CAROTID DUPLEX ULTRASOUND TECHNIQUE: Wallace Cullens scale imaging, color Doppler and duplex ultrasound were performed of bilateral carotid and vertebral arteries in the neck. COMPARISON:  None. FINDINGS: Criteria: Quantification of carotid stenosis is based on velocity parameters that correlate the residual internal carotid diameter with NASCET-based stenosis levels, using the diameter of the distal internal carotid lumen as the denominator for stenosis measurement. The following velocity measurements were obtained: RIGHT ICA: 82/21 cm/sec CCA: 67/70 cm/sec SYSTOLIC ICA/CCA RATIO:  1.2 ECA: 100 cm/sec LEFT ICA: 79/17 cm/sec CCA: 93/18 cm/sec SYSTOLIC ICA/CCA RATIO:  0.9 ECA: 82 cm/sec RIGHT CAROTID ARTERY: Moderate heterogeneous and calcified carotid bifurcation atherosclerosis. Despite this, no hemodynamically significant right ICA stenosis, velocity elevation, turbulent flow. Degree of narrowing less than 50% by ultrasound criteria RIGHT VERTEBRAL ARTERY:  Normal antegrade flow LEFT CAROTID ARTERY: Similar moderate calcified bifurcation atherosclerosis. No hemodynamically significant left ICA stenosis, velocity elevation, turbulent flow. Degree of narrowing also estimated at less than 50%. LEFT VERTEBRAL ARTERY:  Normal antegrade flow IMPRESSION: Bilateral carotid atherosclerosis. No hemodynamically significant ICA stenosis. Degree of narrowing less  than 50% bilaterally by ultrasound criteria. Patent antegrade vertebral flow bilaterally Electronically Signed   By: Judie Petit.  Shick M.D.   On: 03/02/2020 09:15   DG CHEST PORT 1 VIEW  Result Date: 03/02/2020 CLINICAL DATA:  Left leg weakness.  COVID positive. EXAM: PORTABLE CHEST 1 VIEW COMPARISON:  None. FINDINGS: The heart size and mediastinal contours are within normal limits. Both lungs are clear. The visualized skeletal structures are unremarkable. IMPRESSION: No active disease. Electronically Signed   By: Charlett Nose M.D.   On: 03/02/2020 02:03   ECHOCARDIOGRAM COMPLETE  Result Date: 03/02/2020    ECHOCARDIOGRAM REPORT   Patient Name:   IRACEMA LANAGAN Date of Exam: 03/02/2020 Medical Rec #:  [id number]          Height:       63.0 in Accession #:    [id number]         Weight:       147.0 lb Date of Birth:  Oct 05, 1955           BSA:          1.696 m Patient Age:    64 years           BP:           136/54 mmHg Patient Gender: F                  HR:           87 bpm. [facility]  Procedure: 2D Echo, Cardiac Doppler and Color Doppler Indications:    TIA 435.9 / G45.9  History:        Patient has prior history of Echocardiogram examinations, most                 recent 11/26/2017. TIA; Risk Factors:Hypertension, Diabetes,                 Dyslipidemia and Tobacco abuse.  Sonographer:    Celesta Gentile RCS Referring Phys: [id number] DAVID MANUEL ORTIZ IMPRESSIONS  1. Left ventricular ejection fraction, by estimation, is 70 to 75%. The left ventricle has normal function. The left ventricle has no regional wall motion abnormalities. There is severe left ventricular hypertrophy. Left ventricular diastolic parameters  are consistent with Grade I diastolic dysfunction (impaired relaxation).  2. Right ventricular systolic function is normal. The right ventricular size is normal.  3. The mitral valve is normal in structure. No evidence of mitral valve regurgitation. No evidence of mitral stenosis.  4. The  aortic valve is tricuspid. Aortic valve regurgitation is not visualized. No aortic stenosis is present.  5. The inferior vena cava is normal in size with greater than 50% respiratory variability, suggesting right atrial pressure of 3 mmHg. FINDINGS  Left Ventricle: Left ventricular ejection fraction, by estimation, is 70 to 75%. The left ventricle has normal function. The left ventricle has no regional wall motion abnormalities. The left ventricular internal cavity size was normal in size. There is  severe left ventricular hypertrophy. Left ventricular diastolic parameters are consistent with Grade I diastolic dysfunction (impaired relaxation). Normal left ventricular filling pressure. Right Ventricle: The right ventricular size is normal. No increase in right ventricular wall thickness. Right ventricular systolic function is normal. Left Atrium: Left atrial size was normal in size. Right Atrium: Right atrial size was normal in size. Pericardium: There is no evidence of pericardial effusion. Mitral Valve: The mitral valve is normal in structure. No evidence of mitral valve regurgitation. No evidence of mitral valve stenosis. Tricuspid Valve: The tricuspid valve is normal in structure. Tricuspid valve regurgitation is not demonstrated. No evidence of tricuspid stenosis. Aortic Valve: The aortic valve is tricuspid. Aortic valve regurgitation is not visualized. No aortic stenosis is present. Aortic valve mean gradient measures 5.2 mmHg. Aortic valve peak gradient measures 8.2 mmHg. Aortic valve area, by VTI measures 2.32 cm. Pulmonic Valve: The pulmonic valve was not well visualized. Pulmonic valve regurgitation is not visualized. No evidence of pulmonic stenosis. Aorta: The aortic root is normal in size and structure. Venous: The inferior vena cava is normal in size with greater than 50% respiratory variability, suggesting right atrial pressure of 3 mmHg. IAS/Shunts: No atrial level shunt detected by color flow  Doppler.  LEFT VENTRICLE PLAX 2D LVIDd:         3.52 cm  Diastology LVIDs:         1.80 cm  LV e' medial:    7.29 cm/s LV PW:         1.71 cm  LV E/e' medial:  10.9 LV IVS:        1.67 cm  LV e' lateral:   6.64 cm/s LVOT diam:     1.70 cm  LV E/e' lateral: 12.0 LV SV:         58 LV SV Index:   34 LVOT Area:     2.27 cm  RIGHT VENTRICLE RV S prime:     11.40 cm/s TAPSE (M-mode): 1.8 cm LEFT ATRIUM  Index       RIGHT ATRIUM           Index LA diam:        2.90 cm 1.71 cm/m  RA Area:     12.00 cm LA Vol (A2C):   22.0 ml 12.97 ml/m RA Volume:   27.50 ml  16.21 ml/m LA Vol (A4C):   34.1 ml 20.10 ml/m LA Biplane Vol: 27.4 ml 16.15 ml/m  AORTIC VALVE AV Area (Vmax):    2.06 cm AV Area (Vmean):   1.92 cm AV Area (VTI):     2.32 cm AV Vmax:           143.34 cm/s AV Vmean:          107.812 cm/s AV VTI:            0.251 m AV Peak Grad:      8.2 mmHg AV Mean Grad:      5.2 mmHg LVOT Vmax:         130.00 cm/s LVOT Vmean:        91.400 cm/s LVOT VTI:          0.256 m LVOT/AV VTI ratio: 1.02  AORTA Ao Root diam: 3.10 cm MITRAL VALVE MV Area (PHT): 2.85 cm    SHUNTS MV Decel Time: 266 msec    Systemic VTI:  0.26 m MV E velocity: 79.40 cm/s  Systemic Diam: 1.70 cm MV A velocity: 93.20 cm/s MV E/A ratio:  0.85 Dina Rich MD Electronically signed by Dina Rich MD Signature Date/Time: 03/02/2020/4:57:43 PM    Final         Scheduled Meds: . albuterol  2 puff Inhalation Q6H  . atorvastatin  40 mg Oral Daily  . baclofen  5 mg Oral BID  . enoxaparin (LOVENOX) injection  40 mg Subcutaneous Q24H  . fenofibrate  160 mg Oral Daily  . insulin aspart  0-20 Units Subcutaneous TID WC  . insulin glargine  12 Units Subcutaneous BID  . linagliptin  5 mg Oral Daily  . omega-3 acid ethyl esters  1 g Oral BID   Continuous Infusions: . sodium chloride    . casirivimab-imdevimab IV    . famotidine (PEPCID) IV       LOS: 1 day    Time spent: 35 minutes    Fae Blossom Hoover Brunette, DO [facility]  If 7PM-7AM, please contact night-coverage [email or web address] 03/03/2020, 10:27 AM

## 2020-03-03 NOTE — Plan of Care (Signed)
  Problem: Education: Goal: Knowledge of General Education information will improve Description: Including pain rating scale, medication(s)/side effects and non-pharmacologic comfort measures Outcome: Progressing   Problem: Health Behavior/Discharge Planning: Goal: Ability to manage health-related needs will improve Outcome: Progressing   Problem: Clinical Measurements: Goal: Ability to maintain clinical measurements within normal limits will improve Outcome: Progressing   Problem: Education: Goal: Knowledge of disease or condition will improve Outcome: Progressing Goal: Knowledge of secondary prevention will improve Outcome: Progressing   Problem: Coping: Goal: Will verbalize positive feelings about self Outcome: Progressing Goal: Will identify appropriate support needs Outcome: Progressing

## 2020-03-03 NOTE — Plan of Care (Signed)
  Problem: Education: Goal: Knowledge of General Education information will improve Description: Including pain rating scale, medication(s)/side effects and non-pharmacologic comfort measures Outcome: Progressing   Problem: Health Behavior/Discharge Planning: Goal: Ability to manage health-related needs will improve Outcome: Progressing   Problem: Clinical Measurements: Goal: Ability to maintain clinical measurements within normal limits will improve Outcome: Progressing Goal: Will remain free from infection Outcome: Progressing Goal: Diagnostic test results will improve Outcome: Progressing Goal: Respiratory complications will improve Outcome: Progressing Goal: Cardiovascular complication will be avoided Outcome: Progressing   Problem: Activity: Goal: Risk for activity intolerance will decrease Outcome: Progressing   Problem: Nutrition: Goal: Adequate nutrition will be maintained Outcome: Progressing   Problem: Coping: Goal: Level of anxiety will decrease Outcome: Progressing   Problem: Elimination: Goal: Will not experience complications related to bowel motility Outcome: Progressing Goal: Will not experience complications related to urinary retention Outcome: Progressing   Problem: Pain Managment: Goal: General experience of comfort will improve Outcome: Progressing   Problem: Safety: Goal: Ability to remain free from injury will improve Outcome: Progressing   Problem: Skin Integrity: Goal: Risk for impaired skin integrity will decrease Outcome: Progressing   Problem: Education: Goal: Knowledge of disease or condition will improve Outcome: Progressing Goal: Knowledge of secondary prevention will improve Outcome: Progressing   Problem: Coping: Goal: Will verbalize positive feelings about self Outcome: Progressing Goal: Will identify appropriate support needs Outcome: Progressing   Problem: Education: Goal: Knowledge of risk factors and measures for  prevention of condition will improve Outcome: Progressing   Problem: Coping: Goal: Psychosocial and spiritual needs will be supported Outcome: Progressing   Problem: Respiratory: Goal: Will maintain a patent airway Outcome: Progressing Goal: Complications related to the disease process, condition or treatment will be avoided or minimized Outcome: Progressing   

## 2020-03-03 NOTE — Clinical Social Work Note (Signed)
Patient requested that  [name] be removed from her chart and that she not be informed of her care. She requested that I her niece, [name], be added.

## 2020-03-03 NOTE — Progress Notes (Signed)
Instruction given on IS to patient.  Patient was able to get to 500 ml with best effort X8.  Instructed patient to try to do as many as she could in an hour due to this can make patient SOB at times.

## 2020-03-03 NOTE — Progress Notes (Signed)
SLP Cancellation Note  Patient Details Name: [name] MRN: [id number] DOB: 08/26/1955   Cancelled treatment:       Reason Eval/Treat Not Completed: SLP screened, no needs identified, will sign off.  MRI negative for acute changes. SLE will be deferred at this time. Reconsult if indicated. SLP will sign off.  Thank you,  Havery Moros, CCC-SLP [phone]     [name] 03/03/2020, 11:20 AM

## 2020-03-03 NOTE — TOC Initial Note (Signed)
Transition of Care [facility]) - Initial/Assessment Note    Patient Details  Name: [name] MRN: [id number] Date of Birth: 09-Dec-1955  [facility] CM/SW Contact:    Annice Needy, LCSW Phone Number: 03/03/2020, 10:44 AM  Clinical Narrative:                 Patient from home with spouse. Niece assists with ADLs every other day. Patient has hemi walker, [facility], [place]. PT eval discussed. Initially patient refused both SNF and HH. Patient reconsidered and is agreeable to COVID accepting SNF.   Expected Discharge Plan: Home/Self Care Barriers to Discharge: Continued Medical Work up   Patient Goals and CMS Choice Patient states their goals for this hospitalization and ongoing recovery are:: home   Choice offered to / list presented to : Patient  Expected Discharge Plan and Services Expected Discharge Plan: Home/Self Care       Living arrangements for the past 2 months: Single Family Home                                      Prior Living Arrangements/Services Living arrangements for the past 2 months: Single Family Home Lives with:: Spouse Patient language and need for interpreter reviewed:: Yes Do you feel safe going back to the place where you live?: Yes      Need for Family Participation in Patient Care: Yes (Comment) Care giver support system in place?: Yes (comment) Current home services: DME Criminal Activity/Legal Involvement Pertinent to Current Situation/Hospitalization: No - Comment as needed  Activities of Daily Living Home Assistive Devices/Equipment: Eyeglasses, Dan Humphreys (specify type) ADL Screening (condition at time of admission) Patient's cognitive ability adequate to safely complete daily activities?: Yes Is the patient deaf or have difficulty hearing?: No Does the patient have difficulty seeing, even when wearing glasses/contacts?: No Does the patient have difficulty concentrating, remembering, or making decisions?: No Patient able to  express need for assistance with ADLs?: Yes Does the patient have difficulty dressing or bathing?: No Independently performs ADLs?: Yes (appropriate for developmental age) Does the patient have difficulty walking or climbing stairs?: No Weakness of Legs: None Weakness of Arms/Hands: None  Permission Sought/Granted                  Emotional Assessment Appearance:: Appears stated age   Affect (typically observed): Appropriate Orientation: : Oriented to Self, Oriented to Place, Oriented to  Time, Oriented to Situation Alcohol / Substance Use: Not Applicable Psych Involvement: No (comment)  Admission diagnosis:  Weakness of right lower extremity [R29.898] Weakness of left lower extremity [R29.898] Cerebrovascular accident (CVA), unspecified mechanism (HCC) [I63.9] 2019 novel coronavirus disease (COVID-19) [U07.1] Pneumonia due to COVID-19 virus [U07.1, J12.82] Weakness of extremity [R29.898] Patient Active Problem List   Diagnosis Date Noted  . Weakness of extremity 03/03/2020  . COVID-19 virus infection 03/02/2020  . Weakness of left lower extremity 03/01/2020  . Azotemia 03/01/2020  . Hyponatremia 03/01/2020  . Right spastic hemiplegia (HCC) 05/28/2018  . Hypothyroidism 11/29/2017  . Cerebrovascular accident (CVA) (HCC)   . Ischemic stroke (HCC)   . Type 2 diabetes mellitus with hyperglycemia (HCC) 11/25/2017  . HLD (hyperlipidemia) 11/25/2017  . Fibromyalgia 11/25/2017  . Depression 11/25/2017  . Tobacco abuse 11/25/2017  . Papule 03/22/2015  . Hypertension   . Acute diverticulitis 11/08/2014  . Diabetes (HCC) 11/08/2014  . Renal insufficiency 11/08/2014  . Abdominal pain  11/08/2014  . Left leg weakness 04/10/2011  . Abnormal gait 04/10/2011  . S/P total knee replacement 12/28/2010  . Knee pain 12/28/2010   PCP:  Benita Stabile, MD Pharmacy:   [facility] [address] [place] [address] [place] [address] [place] [address] [place] [address] Phone:  [phone] Fax: [phone]     Social Determinants of Health (SDOH) Interventions    Readmission Risk Interventions No flowsheet data found.

## 2020-03-03 NOTE — Evaluation (Signed)
Occupational Therapy Evaluation Patient Details Name: [name] MRN: [id number] DOB: January 02, 1956 Today's Date: 03/03/2020    History of Present Illness [name] is a 64 y.o. female with medical history significant of hypertension, hyperlipidemia, type 2 diabetes mellitus, tobacco abuse and fibromyalgia; previous CVA on November 25, 2017 who presented to the emergency department secondary to right-sided lower extremity weakness since 2 days ago.  She also says that she has been experiencing generalized fatigue. MRI negative for acute infarct.    Clinical Impression   Pt agreeable to OT evaluation this am. Pt reports fatigue today, requiring mod assist for bed mobility tasks. Pt able to complete simple ADLs while seated at EOB, requiring increased assist for lateral/scoot transfer due to generalized weakness. Pt with right hemiplegia s/p CVA in 2019, unable to use RUE functionally at baseline. LUE strength WNL today. Recommend SNF on discharge to improve strength and activity tolerance required for ADL completion, as well as improve safety and optimize independence with functional tasks. Will continue to follow while in acute care.     Follow Up Recommendations  SNF    Equipment Recommendations  None recommended by OT       Precautions / Restrictions Precautions Precautions: Fall Restrictions Weight Bearing Restrictions: No      Mobility Bed Mobility Overal bed mobility: Needs Assistance Bed Mobility: Supine to Sit;Sit to Supine     Supine to sit: Mod assist Sit to supine: Mod assist   General bed mobility comments: slow labored movement    Transfers Overall transfer level: Needs assistance Equipment used: Rolling walker (2 wheeled);1 person hand held assist Transfers: Sit to/from Stand;Lateral/Scoot Transfers Sit to Stand: Mod assist        Lateral/Scoot Transfers: Mod assist          ADL either performed or assessed with clinical judgement   ADL  Overall ADL's : Needs assistance/impaired     Grooming: Set up;Sitting Grooming Details (indicate cue type and reason): Pt able to use LUE for grooming with set-up of materials Upper Body Bathing: Moderate assistance;Sitting   Lower Body Bathing: Moderate assistance;Maximal assistance;Sitting/lateral leans   Upper Body Dressing : Moderate assistance;Sitting Upper Body Dressing Details (indicate cue type and reason): Assist for adjusting gown and managing ties and buttons Lower Body Dressing: Maximal assistance;Sitting/lateral leans                 General ADL Comments: Pt requiring increased assistance this am due to general fatigue. Has right hemiplegia at baseline and requires assistance due to inability to use RUE functionally     Vision Baseline Vision/History: No visual deficits Patient Visual Report: No change from baseline Vision Assessment?: No apparent visual deficits            Pertinent Vitals/Pain Pain Assessment: Faces Faces Pain Scale: Hurts little more Pain Location: back Pain Descriptors / Indicators: Grimacing Pain Intervention(s): Limited activity within patient's tolerance;Monitored during session;Repositioned     Hand Dominance Right   Extremity/Trunk Assessment Upper Extremity Assessment Upper Extremity Assessment: RUE deficits/detail (LUE 4+/5) RUE Deficits / Details: spastic hemiplegia-no active movement of RUE RUE Sensation: decreased light touch RUE Coordination: decreased fine motor;decreased gross motor   Lower Extremity Assessment Lower Extremity Assessment: Defer to PT evaluation   Cervical / Trunk Assessment Cervical / Trunk Assessment: Normal   Communication Communication Communication: No difficulties   Cognition Arousal/Alertness: Awake/alert Behavior During Therapy: WFL for tasks assessed/performed Overall Cognitive Status: Within Functional Limits for tasks assessed  Home Living Family/patient expects to be discharged to:: Private residence Living Arrangements: Spouse/significant other Available Help at Discharge: Family;Available PRN/intermittently Type of Home: House Home Access: Stairs to enter [employer] of Steps: 4-5 Entrance Stairs-Rails: Right;Left;Can reach both Home Layout: One level     Bathroom Shower/Tub: Producer, television/film/video: Standard     Home Equipment: Cane - single point;Walker - 2 wheels;Shower seat;Bedside commode;Other (comment) (hemi-walker)   Additional Comments: [place] ambulator using Hemi-walker      Prior Functioning/Environment Level of Independence: Needs assistance  Gait / Transfers Assistance Needed: household ambulator using Hemi-walker, non-functional RUE ADL's / Homemaking Assistance Needed: assisted by family for all ADLs            OT Problem List: Decreased strength;Decreased activity tolerance;Impaired balance (sitting and/or standing);Decreased safety awareness;Impaired UE functional use      OT Treatment/Interventions: Self-care/ADL training;Therapeutic exercise;Neuromuscular education;DME and/or AE instruction;Therapeutic activities;Patient/family education    OT Goals(Current goals can be found in the care plan section) Acute Rehab OT Goals Patient Stated Goal: return home after rehab OT Goal Formulation: With patient Time For Goal Achievement: 03/17/20 Potential to Achieve Goals: Good  OT Frequency: Min 1X/week              AM-PAC OT "6 Clicks" Daily Activity     Outcome Measure Help from another person eating meals?: A Little Help from another person taking care of personal grooming?: A Little Help from another person toileting, which includes using toliet, bedpan, or urinal?: A Lot Help from another person bathing (including washing, rinsing, drying)?: A Lot Help from another person to put on and taking off regular upper body clothing?: A  Little Help from another person to put on and taking off regular lower body clothing?: A Lot 6 Click Score: 15   End of Session Equipment Utilized During Treatment: Gait belt;Rolling walker  Activity Tolerance: Patient limited by fatigue;Patient limited by lethargy Patient left: in bed;with call bell/phone within reach;with bed alarm set (with RT in room)  OT Visit Diagnosis: Muscle weakness (generalized) (M62.81);Hemiplegia and hemiparesis Hemiplegia - Right/Left: Right Hemiplegia - dominant/non-dominant: Dominant Hemiplegia - caused by: Cerebral infarction                Time: 8590-9311 OT Time Calculation (min): 28 min Charges:  OT General Charges $OT Visit: 1 Visit OT Evaluation $OT Eval Low Complexity: 1 Low   Ezra Sites, OTR/L  [phone] 03/03/2020, 8:42 AM

## 2020-03-03 NOTE — Progress Notes (Addendum)
Call and updated Ronald Pippins at [phone].   Dr. Sherryll Burger attempted to call [facility], no response.

## 2020-03-03 NOTE — Progress Notes (Signed)
Physical Therapy Treatment Patient Details Name: [name] MRN: [id number] DOB: 1955/06/30 Today's Date: 03/03/2020    History of Present Illness [name] is a 64 y.o. female with medical history significant of hypertension, hyperlipidemia, type 2 diabetes mellitus, tobacco abuse and fibromyalgia; previous CVA on November 25, 2017 who presented to the emergency department secondary to right-sided lower extremity weakness since 2 days ago.  She also says that she has been experiencing generalized fatigue. MRI negative for acute infarct.     PT Comments    Patient requires slightly less assistance for sitting up at bedside with tactile cueing to upper back, has difficulty scooting to EOB due to right sided weakness, able to take a few steps using RW with left hand, required much time and Mod assist to transfer to chair.  Patient tolerated sitting up in chair after therapy - RN notified.  Patient will benefit from continued physical therapy in hospital and recommended venue below to increase strength, balance, endurance for safe ADLs and gait.    Follow Up Recommendations  SNF     Equipment Recommendations  None recommended by PT    Recommendations for Other Services       Precautions / Restrictions Precautions Precautions: Fall Restrictions Weight Bearing Restrictions: No    Mobility  Bed Mobility Overal bed mobility: Needs Assistance       Supine to sit: HOB elevated;Min assist     General bed mobility comments: required tactile cueing from upper back during supine to sitting to left side of bed - had difficulty using bed rail with LUE sitting up to the left side  Transfers Overall transfer level: Needs assistance Equipment used: Rolling walker (2 wheeled);1 person hand held assist Transfers: Sit to/from Stand;Lateral/Scoot Transfers Sit to Stand: Mod assist Stand pivot transfers: Mod assist       General transfer comment: increased time, labored  movement  Ambulation/Gait Ambulation/Gait assistance: Mod assist;Max assist Gait Distance (Feet): 6 Feet Assistive device: Rolling walker (2 wheeled) Gait Pattern/deviations: Decreased step length - right;Decreased step length - left;Decreased stride length;Decreased stance time - right Gait velocity: slow   General Gait Details: very unsteady on feet with difficulty advancing RLE due to weakness, used left hand on RW while, required tactile cueing to right side to improve taking steps with RLE   Stairs             Wheelchair Mobility    Modified Rankin (Stroke Patients Only)       Balance Overall balance assessment: Needs assistance Sitting-balance support: Feet supported;No upper extremity supported Sitting balance-Leahy Scale: Fair Sitting balance - Comments: fair/good seated at EOB   Standing balance support: During functional activity;Single extremity supported Standing balance-Leahy Scale: Poor Standing balance comment: fair/poor using left hand on RW                            Cognition Arousal/Alertness: Awake/alert Behavior During Therapy: WFL for tasks assessed/performed Overall Cognitive Status: Within Functional Limits for tasks assessed                                        Exercises General Exercises - Lower Extremity Long Arc Quad: Seated;AROM;Strengthening;Both;10 reps Hip Flexion/Marching: Seated;AROM;Strengthening;Both;10 reps Toe Raises: Seated;AROM;Strengthening;Left;10 reps Heel Raises: Seated;AROM;Strengthening;10 reps;Left    General Comments        Pertinent Vitals/Pain Pain  Assessment: No/denies pain    Home Living                      Prior Function            PT Goals (current goals can now be found in the care plan section) Acute Rehab PT Goals Patient Stated Goal: return home after rehab PT Goal Formulation: With patient Time For Goal Achievement: 03/16/20 Potential to Achieve  Goals: Good Progress towards PT goals: Progressing toward goals    Frequency    Min 3X/week      PT Plan Current plan remains appropriate    Co-evaluation              AM-PAC PT "6 Clicks" Mobility   Outcome Measure  Help needed turning from your back to your side while in a flat bed without using bedrails?: A Lot Help needed moving from lying on your back to sitting on the side of a flat bed without using bedrails?: A Lot Help needed moving to and from a bed to a chair (including a wheelchair)?: A Lot Help needed standing up from a chair using your arms (e.g., wheelchair or bedside chair)?: A Lot Help needed to walk in hospital room?: A Lot Help needed climbing 3-5 steps with a railing? : Total 6 Click Score: 11    End of Session   Activity Tolerance: Patient tolerated treatment well;Patient limited by fatigue Patient left: with call bell/phone within reach;in chair Nurse Communication: Mobility status PT Visit Diagnosis: Unsteadiness on feet (R26.81);Other abnormalities of gait and mobility (R26.89);Muscle weakness (generalized) (M62.81)     Time: 4431-5400 PT Time Calculation (min) (ACUTE ONLY): 31 min  Charges:  $Therapeutic Exercise: 8-22 mins $Therapeutic Activity: 8-22 mins                     2:12 PM, 03/03/20 Ocie Bob, MPT Physical Therapist with [facility] 336 [phone] office [phone] mobile phone

## 2020-03-03 NOTE — Plan of Care (Signed)
  Problem: Education: Goal: Knowledge of General Education information will improve Description: Including pain rating scale, medication(s)/side effects and non-pharmacologic comfort measures Outcome: Progressing   Problem: Health Behavior/Discharge Planning: Goal: Ability to manage health-related needs will improve Outcome: Progressing   Problem: Education: Goal: Knowledge of risk factors and measures for prevention of condition will improve Outcome: Progressing   Problem: Coping: Goal: Psychosocial and spiritual needs will be supported Outcome: Progressing   Problem: Respiratory: Goal: Will maintain a patent airway Outcome: Progressing Goal: Complications related to the disease process, condition or treatment will be avoided or minimized Outcome: Progressing   

## 2020-03-03 NOTE — Plan of Care (Signed)
  Problem: Acute Rehab OT Goals (only OT should resolve) Goal: Pt. Will Perform Eating Flowsheets (Taken 03/03/2020 0843) Pt Will Perform Eating:  with set-up  sitting Goal: Pt. Will Perform Grooming Flowsheets (Taken 03/03/2020 0843) Pt Will Perform Grooming:  with set-up  sitting Goal: Pt. Will Perform Upper Body Dressing Flowsheets (Taken 03/03/2020 0843) Pt Will Perform Upper Body Dressing:  with supervision  sitting Goal: Pt. Will Transfer To Toilet Flowsheets (Taken 03/03/2020 [phone]) Pt Will Transfer to Toilet:  with min guard assist  ambulating  regular height toilet Goal: Pt. Will Perform Toileting-Clothing Manipulation Flowsheets (Taken 03/03/2020 0843) Pt Will Perform Toileting - Clothing Manipulation and hygiene:  with supervision  sitting/lateral leans  sit to/from stand

## 2020-03-04 LAB — FERRITIN: Ferritin: 135 ng/mL (ref 11–307)

## 2020-03-04 LAB — CBC WITH DIFFERENTIAL/PLATELET
Abs Immature Granulocytes: 0.01 10*3/uL (ref 0.00–0.07)
Basophils Absolute: 0 10*3/uL (ref 0.0–0.1)
Basophils Relative: 0 %
Eosinophils Absolute: 0 10*3/uL (ref 0.0–0.5)
Eosinophils Relative: 0 %
HCT: 38.6 % (ref 36.0–46.0)
Hemoglobin: 12.5 g/dL (ref 12.0–15.0)
Immature Granulocytes: 0 %
Lymphocytes Relative: 30 %
Lymphs Abs: 1.6 10*3/uL (ref 0.7–4.0)
MCH: 29.9 pg (ref 26.0–34.0)
MCHC: 32.4 g/dL (ref 30.0–36.0)
MCV: 92.3 fL (ref 80.0–100.0)
Monocytes Absolute: 0.4 10*3/uL (ref 0.1–1.0)
Monocytes Relative: 8 %
Neutro Abs: 3.3 10*3/uL (ref 1.7–7.7)
Neutrophils Relative %: 62 %
Platelets: 166 10*3/uL (ref 150–400)
RBC: 4.18 MIL/uL (ref 3.87–5.11)
RDW: 14 % (ref 11.5–15.5)
WBC: 5.3 10*3/uL (ref 4.0–10.5)
nRBC: 0 % (ref 0.0–0.2)

## 2020-03-04 LAB — GLUCOSE, CAPILLARY
Glucose-Capillary: 171 mg/dL — ABNORMAL HIGH (ref 70–99)
Glucose-Capillary: 144 mg/dL — ABNORMAL HIGH (ref 70–99)
Glucose-Capillary: 135 mg/dL — ABNORMAL HIGH (ref 70–99)
Glucose-Capillary: 138 mg/dL — ABNORMAL HIGH (ref 70–99)
Glucose-Capillary: 135 mg/dL — ABNORMAL HIGH (ref 70–99)
Glucose-Capillary: 108 mg/dL — ABNORMAL HIGH (ref 70–99)

## 2020-03-04 LAB — COMPREHENSIVE METABOLIC PANEL
ALT: 20 U/L (ref 0–44)
AST: 27 U/L (ref 15–41)
Albumin: 3 g/dL — ABNORMAL LOW (ref 3.5–5.0)
Alkaline Phosphatase: 51 U/L (ref 38–126)
Anion gap: 9 (ref 5–15)
BUN: 29 mg/dL — ABNORMAL HIGH (ref 8–23)
CO2: 22 mmol/L (ref 22–32)
Calcium: 8.5 mg/dL — ABNORMAL LOW (ref 8.9–10.3)
Chloride: 102 mmol/L (ref 98–111)
Creatinine, Ser: 1.27 mg/dL — ABNORMAL HIGH (ref 0.44–1.00)
GFR, Estimated: 47 mL/min — ABNORMAL LOW (ref >60)
Glucose, Bld: 162 mg/dL — ABNORMAL HIGH (ref 70–99)
Potassium: 4.1 mmol/L (ref 3.5–5.1)
Sodium: 133 mmol/L — ABNORMAL LOW (ref 135–145)
Total Bilirubin: 0.6 mg/dL (ref 0.3–1.2)
Total Protein: 6.9 g/dL (ref 6.5–8.1)

## 2020-03-04 LAB — C-REACTIVE PROTEIN: CRP: 1.2 mg/dL — ABNORMAL HIGH (ref <1.0)

## 2020-03-04 LAB — D-DIMER, QUANTITATIVE: D-Dimer, Quant: 1.37 ug/mL-FEU — ABNORMAL HIGH (ref 0.00–0.50)

## 2020-03-04 MED ORDER — ASPIRIN EC 81 MG PO TBEC: 81.0000 mg | DELAYED_RELEASE_TABLET | Freq: Every day | ORAL | 2 refills | Status: AC

## 2020-03-04 NOTE — Discharge Summary (Addendum)
Physician Discharge Summary  [place] GXQ:[id number] DOB: Mar 25, 1956 DOA: 03/01/2020  PCP: Celene Squibb, MD  Admit date: 03/01/2020  Discharge date: 03/05/2020  Admitted From:Home  Disposition:  SNF  Recommendations for Outpatient Follow-up:  1. Follow up with PCP in 1-2 weeks 2. Continue on statin and aspirin as prescribed to mitigate CVA risk.  No findings of acute CVA noted on MRI 3. Continue other home medications as prior with Coreg for hypertension and avoid other antihypertensives as blood pressure has remained stable without them.  May resume as needed. 4. Avoid HCTZ given hyponatremia likely from this medication  Home Health: None  Equipment/Devices: None  Discharge Condition: Stable  CODE STATUS: Full  Diet recommendation: Heart Healthy/carb modified  Brief/Interim Summary: Per HPI: [place] T Bey is a 64 y.o. femalewith medical history significant of hypertension, hyperlipidemia,type 2 diabetes mellitus, tobacco abuse and fibromyalgia; previous CVA on November 25, 2017 who presented to the emergency department secondary to right-sided lower extremity weakness since 2 days ago. She also says that she has been experiencing generalized fatigue. She denies fever, but complains of chills, malaise and decreased appetite. Positive renal area, sore throat, dry cough and mild dyspnea. Positive pleuritic chest pain. She denies precordial chest pain, minimal pain, but states she has had multiple episodes of loose stools. No dysuria, frequency or hematuria.  11/3: Patient was admitted with right lower extremity weakness that appears to be acute on chronic.  She appears to have right spastic hemiplegia to this area.  She was also noted to have COVID-19 viral infection, but was asymptomatic and was given monoclonal antibodies.  She is overall doing well this morning.  MRI of the brain with no acute findings.  She did have a fever this morning.  11/4: Patient has not had  fever in the last 24 hours and overall appears stable for discharge today.  Blood cultures pending, but procalcitonin is low.  Right lower extremity weakness acute on chronic -In setting of right spastic hemiplegia with prior CVA in 2019 -Brain MRI and angiogram with no acute findings of CVA or LVO -2D echocardiogram with LVEF 70-75% with grade 1 diastolic dysfunction -LDL 87 and patient currently on statin -Hemoglobin A1c 8.1% -Continue on aspirin 81 mg daily -Acute rehab will be arranged  COVID-19 virus infection -No prior vaccination -No hypoxemia and chest x-ray clear -Given monoclonal antibody 11/2  Fever-resolved -Likely related to COVID-19 virus infection with monoclonal antibody infusion on 11/2 -Continue to monitor for another 24 hours -Low procalcitonin and blood culture still pending  Mild hyponatremia -Likely related to some dehydration with GI losses and home HCTZ use which will be avoided -This is improving, and she will need follow-up BMP in the near future -Avoid HCTZ use  Hypertension-controlled -Resume on Coreg and avoid other antihypertensives  Type 2 diabetes -Hyperglycemia improved -Hemoglobin A1c 8.1% -Continue medications as noted below  Dyslipidemia -LDL 87 with prior CVA noted -Continue atorvastatin 40 mg p.o. daily  Discharge Diagnoses:  Principal Problem:   Weakness of left lower extremity Active Problems:   Hypertension   Type 2 diabetes mellitus with hyperglycemia (HCC)   HLD (hyperlipidemia)   Right spastic hemiplegia (HCC)   Azotemia   Hyponatremia   COVID-19 virus infection   Weakness of extremity  Principal discharge diagnosis: Right lower extremity weakness acute on chronic in the setting of right spastic hemiplegia with prior CVA in 2019.  Discharge Instructions   Allergies as of 03/05/2020      Reactions  Bee Pollen Anaphylaxis, Swelling   Bee Venom    Codeine    REACTION: nauseated,claustrophobic       Medication List    STOP taking these medications   aspirin 325 MG tablet Replaced by: aspirin EC 81 MG tablet   Baclofen 5 MG Tabs   cloNIDine 0.1 MG tablet Commonly known as: CATAPRES   hydrochlorothiazide 12.5 MG tablet Commonly known as: HYDRODIURIL   losartan 100 MG tablet Commonly known as: COZAAR   olmesartan 40 MG tablet Commonly known as: BENICAR     TAKE these medications   albuterol 108 (90 Base) MCG/ACT inhaler Commonly known as: VENTOLIN HFA Inhale 2 puffs into the lungs every 6 (six) hours as needed.   amLODipine 10 MG tablet Commonly known as: NORVASC Take 10 mg by mouth at bedtime.   aspirin EC 81 MG tablet Take 1 tablet (81 mg total) by mouth daily. Swallow whole. Replaces: aspirin 325 MG tablet   atorvastatin 40 MG tablet Commonly known as: LIPITOR Take 40 mg by mouth daily.   carvedilol 6.25 MG tablet Commonly known as: COREG Take 6.25 mg by mouth 2 (two) times daily.   diclofenac sodium 1 % Gel Commonly known as: Voltaren Apply 3 g to 3 large joints up to 3 times a day when necessary   fenofibrate 160 MG tablet Take 160 mg by mouth daily.   hydrALAZINE 10 MG tablet Commonly known as: APRESOLINE Take 10 mg by mouth 2 (two) times daily.   insulin detemir 100 UNIT/ML injection Commonly known as: LEVEMIR Inject 0.3 mLs (30 Units total) into the skin daily.   ketoconazole 2 % cream Commonly known as: NIZORAL Apply 1 application topically 2 (two) times daily.   lidocaine 5 % Commonly known as: Lidoderm Place 1 patch onto the skin daily as needed. Apply patch to area most significant pain once per day.  Remove and discard patch within 12 hours of application.   metFORMIN 1000 MG tablet Commonly known as: Glucophage Take 1 tablet (1,000 mg total) by mouth 2 (two) times daily with a meal.   omega-3 acid ethyl esters 1 g capsule Commonly known as: LOVAZA Take 1 capsule (1 g total) by mouth 2 (two) times daily.   OneTouch Verio test  strip Generic drug: glucose blood USE 1 STRIP TO CHECK GLUCOSE THREE TIMES DAILY   OneTouch Verio w/Device Kit USE TO CHECK GLUCOSE THREE TIMES DAILY   simvastatin 40 MG tablet Commonly known as: ZOCOR Take 40 mg by mouth daily at 6 PM.   Soliqua 100-33 UNT-MCG/ML Sopn Generic drug: Insulin Glargine-Lixisenatide Inject 16 Units into the skin daily before breakfast. (Use less than 1 hour before first meal of the day)       Follow-up Information    Celene Squibb, MD Follow up in 1 week(s).   Specialty: Internal Medicine Contact information: [place] [address] [phone]              Allergies  Allergen Reactions  . Bee Pollen Anaphylaxis and Swelling  . Bee Venom   . Codeine     REACTION: nauseated,claustrophobic    Consultations:  None   Procedures/Studies: CT HEAD WO CONTRAST  Result Date: 03/01/2020 CLINICAL DATA:  Neuro deficit, acute stroke suspected. Left leg weakness at X 2 days. History of previous stroke with right-sided weakness. Per family, smile looks different. EXAM: CT HEAD WITHOUT CONTRAST TECHNIQUE: Contiguous axial images were obtained from the base of the skull through  the vertex without intravenous contrast. COMPARISON:  MR head 11/26/2017. CT head 11/25/2017 FINDINGS: Brain: Patchy and confluent areas of decreased attenuation are noted throughout the deep and periventricular white matter of the cerebral hemispheres bilaterally, compatible with chronic microvascular ischemic disease. Suggestion of bilateral chronic basal ganglia lacunar infarctions. Slightly more conspicuous/possibly slightly larger in size right basal ganglia hypodensity. No evidence of large-territorial acute infarction. No parenchymal hemorrhage. No mass lesion. No extra-axial collection. No mass effect or midline shift. No hydrocephalus. Basilar cisterns are patent. Vascular: No hyperdense vessel. Atherosclerotic calcifications are present within the  cavernous internal carotid arteries. Skull: No acute fracture or focal lesion. Sinuses/Orbits: Paranasal sinuses and mastoid air cells are clear. The orbits are unremarkable. Other: None. IMPRESSION: 1. Microvascular ischemic changes as well as bilateral chronic basal ganglia lacunar infarcts with possibly slightly larger right basal ganglia hypodensity. Cannot exclude overlying acute/subacute infarction. Consider noncontrast MRI for further/more sensitive evaluation. 2. No acute intracranial hemorrhage. These results were called by telephone at the time of interpretation on 03/01/2020 at 6:41 pm to provider [facility] , who verbally acknowledged these results. Electronically Signed   By: Iven Finn M.D.   On: 03/01/2020 18:39   MR ANGIO HEAD WO CONTRAST  Result Date: 03/02/2020 CLINICAL DATA:  Neuro deficit, acute stroke suspected. Left leg weakness for 2 days. EXAM: MRI HEAD WITHOUT CONTRAST MRA HEAD WITHOUT CONTRAST MRA NECK WITH AND WITHOUT CONTRAST TECHNIQUE: Multiplanar, multiecho pulse sequences of the brain and surrounding structures were obtained without intravenous contrast. Angiographic images of the Circle of Willis were obtained using MRA technique without intravenous contrast. Angiographic images of the neck were obtained using MRA technique with and without intravenous contrast. Carotid stenosis measurements (when applicable) are obtained utilizing NASCET criteria, using the distal internal carotid diameter as the denominator. COMPARISON:  CT head and ultrasound of the carotids from the same day. MRI/MRA from 11/26/2017. FINDINGS: MRI HEAD FINDINGS Brain: No acute infarct. Multiple remote lacunar infarcts involving bilateral basal ganglia and thalami. Scattered T2/FLAIR hyperintensities throughout the white matter and pons, most consistent with chronic microvascular ischemic disease. No acute hemorrhage. No hydrocephalus. No mass lesion or abnormal mass effect. Skull and upper cervical  spine: Normal marrow signal. Sinuses/Orbits: Scattered paranasal sinus mucosal thickening with opacification of ethmoid air cells. Unremarkable orbits. Other: Left mastoid effusion. MRA HEAD FINDINGS The intracranial vertebral arteries are patent in Co dominant. The basilar artery is patent. Similar large right posterior communicating artery. The PCAs are patent with similar multifocal severe P2 PCA stenoses bilaterally. There is apparent fenestration of the right P2 PCA. The internal carotid arteries are patent from the skull base to the carotid termini ACA and MCAs are patent without evidence of significant M1 or A1 stenosis. Suspect at least moderate right proximal M2 MCA stenosis. Similar severe mid left MCA stenosis. The distal ACAs are poorly visualized; however, likely similar moderate left A2 ACA stenosis. MRA NECK FINDINGS Evaluation is limited by patient motion. Nondiagnostic evaluation of the vertebral artery origins. Narrowing at bilateral carotid bifurcations with the degree of narrowing better characterized on same day carotid ultrasound. IMPRESSION: MRI head: 1. No acute intracranial abnormality. Specifically, no acute infarct. 2. Multiple remote lacunar infarcts and chronic microvascular ischemic disease, as detailed above. MRA head/neck: 1. No large vessel occlusion intracranially. Multifocal intracranial atherosclerosis (as detailed above) including severe bilateral P2 PCA stenoses. 2. No specific evidence of hemodynamically significant stenosis or occlusion in the neck, although evaluation is limited by motion (including nondiagnostic evaluation of the  vertebral artery origins). CTA could further evaluate if clinically indicated. 3. See same day ultrasound of the carotids for evaluation of the carotid bifurcations. Electronically Signed   By: Feliberto Harts MD   On: 03/02/2020 12:17   MR ANGIO NECK W WO CONTRAST  Result Date: 03/02/2020 CLINICAL DATA:  Neuro deficit, acute stroke suspected.  Left leg weakness for 2 days. EXAM: MRI HEAD WITHOUT CONTRAST MRA HEAD WITHOUT CONTRAST MRA NECK WITH AND WITHOUT CONTRAST TECHNIQUE: Multiplanar, multiecho pulse sequences of the brain and surrounding structures were obtained without intravenous contrast. Angiographic images of the Circle of Willis were obtained using MRA technique without intravenous contrast. Angiographic images of the neck were obtained using MRA technique with and without intravenous contrast. Carotid stenosis measurements (when applicable) are obtained utilizing NASCET criteria, using the distal internal carotid diameter as the denominator. COMPARISON:  CT head and ultrasound of the carotids from the same day. MRI/MRA from 11/26/2017. FINDINGS: MRI HEAD FINDINGS Brain: No acute infarct. Multiple remote lacunar infarcts involving bilateral basal ganglia and thalami. Scattered T2/FLAIR hyperintensities throughout the white matter and pons, most consistent with chronic microvascular ischemic disease. No acute hemorrhage. No hydrocephalus. No mass lesion or abnormal mass effect. Skull and upper cervical spine: Normal marrow signal. Sinuses/Orbits: Scattered paranasal sinus mucosal thickening with opacification of ethmoid air cells. Unremarkable orbits. Other: Left mastoid effusion. MRA HEAD FINDINGS The intracranial vertebral arteries are patent in Co dominant. The basilar artery is patent. Similar large right posterior communicating artery. The PCAs are patent with similar multifocal severe P2 PCA stenoses bilaterally. There is apparent fenestration of the right P2 PCA. The internal carotid arteries are patent from the skull base to the carotid termini ACA and MCAs are patent without evidence of significant M1 or A1 stenosis. Suspect at least moderate right proximal M2 MCA stenosis. Similar severe mid left MCA stenosis. The distal ACAs are poorly visualized; however, likely similar moderate left A2 ACA stenosis. MRA NECK FINDINGS Evaluation is  limited by patient motion. Nondiagnostic evaluation of the vertebral artery origins. Narrowing at bilateral carotid bifurcations with the degree of narrowing better characterized on same day carotid ultrasound. IMPRESSION: MRI head: 1. No acute intracranial abnormality. Specifically, no acute infarct. 2. Multiple remote lacunar infarcts and chronic microvascular ischemic disease, as detailed above. MRA head/neck: 1. No large vessel occlusion intracranially. Multifocal intracranial atherosclerosis (as detailed above) including severe bilateral P2 PCA stenoses. 2. No specific evidence of hemodynamically significant stenosis or occlusion in the neck, although evaluation is limited by motion (including nondiagnostic evaluation of the vertebral artery origins). CTA could further evaluate if clinically indicated. 3. See same day ultrasound of the carotids for evaluation of the carotid bifurcations. Electronically Signed   By: Feliberto Harts MD   On: 03/02/2020 12:17   MR BRAIN WO CONTRAST  Result Date: 03/02/2020 CLINICAL DATA:  Neuro deficit, acute stroke suspected. Left leg weakness for 2 days. EXAM: MRI HEAD WITHOUT CONTRAST MRA HEAD WITHOUT CONTRAST MRA NECK WITH AND WITHOUT CONTRAST TECHNIQUE: Multiplanar, multiecho pulse sequences of the brain and surrounding structures were obtained without intravenous contrast. Angiographic images of the Circle of Willis were obtained using MRA technique without intravenous contrast. Angiographic images of the neck were obtained using MRA technique with and without intravenous contrast. Carotid stenosis measurements (when applicable) are obtained utilizing NASCET criteria, using the distal internal carotid diameter as the denominator. COMPARISON:  CT head and ultrasound of the carotids from the same day. MRI/MRA from 11/26/2017. FINDINGS: MRI HEAD FINDINGS Brain: No  acute infarct. Multiple remote lacunar infarcts involving bilateral basal ganglia and thalami. Scattered  T2/FLAIR hyperintensities throughout the white matter and pons, most consistent with chronic microvascular ischemic disease. No acute hemorrhage. No hydrocephalus. No mass lesion or abnormal mass effect. Skull and upper cervical spine: Normal marrow signal. Sinuses/Orbits: Scattered paranasal sinus mucosal thickening with opacification of ethmoid air cells. Unremarkable orbits. Other: Left mastoid effusion. MRA HEAD FINDINGS The intracranial vertebral arteries are patent in Co dominant. The basilar artery is patent. Similar large right posterior communicating artery. The PCAs are patent with similar multifocal severe P2 PCA stenoses bilaterally. There is apparent fenestration of the right P2 PCA. The internal carotid arteries are patent from the skull base to the carotid termini ACA and MCAs are patent without evidence of significant M1 or A1 stenosis. Suspect at least moderate right proximal M2 MCA stenosis. Similar severe mid left MCA stenosis. The distal ACAs are poorly visualized; however, likely similar moderate left A2 ACA stenosis. MRA NECK FINDINGS Evaluation is limited by patient motion. Nondiagnostic evaluation of the vertebral artery origins. Narrowing at bilateral carotid bifurcations with the degree of narrowing better characterized on same day carotid ultrasound. IMPRESSION: MRI head: 1. No acute intracranial abnormality. Specifically, no acute infarct. 2. Multiple remote lacunar infarcts and chronic microvascular ischemic disease, as detailed above. MRA head/neck: 1. No large vessel occlusion intracranially. Multifocal intracranial atherosclerosis (as detailed above) including severe bilateral P2 PCA stenoses. 2. No specific evidence of hemodynamically significant stenosis or occlusion in the neck, although evaluation is limited by motion (including nondiagnostic evaluation of the vertebral artery origins). CTA could further evaluate if clinically indicated. 3. See same day ultrasound of the carotids  for evaluation of the carotid bifurcations. Electronically Signed   By: Margaretha Sheffield MD   On: 03/02/2020 12:17   US Carotid Bilateral (at [facility] and [facility] only)  Result Date: 03/02/2020 CLINICAL DATA:  Right lower extremity weakness, hyperlipidemia, diabetes and tobacco use EXAM: BILATERAL CAROTID DUPLEX ULTRASOUND TECHNIQUE: Pearline Cables scale imaging, color Doppler and duplex ultrasound were performed of bilateral carotid and vertebral arteries in the neck. COMPARISON:  None. FINDINGS: Criteria: Quantification of carotid stenosis is based on velocity parameters that correlate the residual internal carotid diameter with NASCET-based stenosis levels, using the diameter of the distal internal carotid lumen as the denominator for stenosis measurement. The following velocity measurements were obtained: RIGHT ICA: 82/21 cm/sec CCA: 95/09 cm/sec SYSTOLIC ICA/CCA RATIO:  1.2 ECA: 100 cm/sec LEFT ICA: 79/17 cm/sec CCA: 32/67 cm/sec SYSTOLIC ICA/CCA RATIO:  0.9 ECA: 82 cm/sec RIGHT CAROTID ARTERY: Moderate heterogeneous and calcified carotid bifurcation atherosclerosis. Despite this, no hemodynamically significant right ICA stenosis, velocity elevation, turbulent flow. Degree of narrowing less than 50% by ultrasound criteria RIGHT VERTEBRAL ARTERY:  Normal antegrade flow LEFT CAROTID ARTERY: Similar moderate calcified bifurcation atherosclerosis. No hemodynamically significant left ICA stenosis, velocity elevation, turbulent flow. Degree of narrowing also estimated at less than 50%. LEFT VERTEBRAL ARTERY:  Normal antegrade flow IMPRESSION: Bilateral carotid atherosclerosis. No hemodynamically significant ICA stenosis. Degree of narrowing less than 50% bilaterally by ultrasound criteria. Patent antegrade vertebral flow bilaterally Electronically Signed   By: Jerilynn Mages.  Shick M.D.   On: 03/02/2020 09:15   DG CHEST PORT 1 VIEW  Result Date: 03/02/2020 CLINICAL DATA:  Left leg weakness.  COVID positive. EXAM: PORTABLE CHEST 1 VIEW  COMPARISON:  None. FINDINGS: The heart size and mediastinal contours are within normal limits. Both lungs are clear. The visualized skeletal structures are unremarkable. IMPRESSION: No active disease. Electronically  Signed   By: Rolm Baptise M.D.   On: 03/02/2020 02:03   ECHOCARDIOGRAM COMPLETE  Result Date: 03/02/2020    ECHOCARDIOGRAM REPORT   Patient Name:   KALII CHESMORE Date of Exam: 03/02/2020 Medical Rec #:  [id number]          Height:       63.0 in Accession #:    [id number]         Weight:       147.0 lb Date of Birth:  [birth year]           BSA:          1.696 m Patient Age:    56 years           BP:           136/54 mmHg Patient Gender: F                  HR:           87 bpm. Exam Location:  [facility] Procedure: 2D Echo, Cardiac Doppler and Color Doppler Indications:    TIA 435.9 / G45.9  History:        Patient has prior history of Echocardiogram examinations, most                 recent 11/26/2017. TIA; Risk Factors:Hypertension, Diabetes,                 Dyslipidemia and Tobacco abuse.  Sonographer:    Alvino Chapel RCS Referring Phys: [id number] [place]  1. Left ventricular ejection fraction, by estimation, is 70 to 75%. The left ventricle has normal function. The left ventricle has no regional wall motion abnormalities. There is severe left ventricular hypertrophy. Left ventricular diastolic parameters  are consistent with Grade I diastolic dysfunction (impaired relaxation).  2. Right ventricular systolic function is normal. The right ventricular size is normal.  3. The mitral valve is normal in structure. No evidence of mitral valve regurgitation. No evidence of mitral stenosis.  4. The aortic valve is tricuspid. Aortic valve regurgitation is not visualized. No aortic stenosis is present.  5. The inferior vena cava is normal in size with greater than 50% respiratory variability, suggesting right atrial pressure of 3 mmHg. FINDINGS  Left Ventricle: Left ventricular ejection  fraction, by estimation, is 70 to 75%. The left ventricle has normal function. The left ventricle has no regional wall motion abnormalities. The left ventricular internal cavity size was normal in size. There is  severe left ventricular hypertrophy. Left ventricular diastolic parameters are consistent with Grade I diastolic dysfunction (impaired relaxation). Normal left ventricular filling pressure. Right Ventricle: The right ventricular size is normal. No increase in right ventricular wall thickness. Right ventricular systolic function is normal. Left Atrium: Left atrial size was normal in size. Right Atrium: Right atrial size was normal in size. Pericardium: There is no evidence of pericardial effusion. Mitral Valve: The mitral valve is normal in structure. No evidence of mitral valve regurgitation. No evidence of mitral valve stenosis. Tricuspid Valve: The tricuspid valve is normal in structure. Tricuspid valve regurgitation is not demonstrated. No evidence of tricuspid stenosis. Aortic Valve: The aortic valve is tricuspid. Aortic valve regurgitation is not visualized. No aortic stenosis is present. Aortic valve mean gradient measures 5.2 mmHg. Aortic valve peak gradient measures 8.2 mmHg. Aortic valve area, by VTI measures 2.32 cm. Pulmonic Valve: The pulmonic valve was not well visualized. Pulmonic valve regurgitation is not visualized.  No evidence of pulmonic stenosis. Aorta: The aortic root is normal in size and structure. Venous: The inferior vena cava is normal in size with greater than 50% respiratory variability, suggesting right atrial pressure of 3 mmHg. IAS/Shunts: No atrial level shunt detected by color flow Doppler.  LEFT VENTRICLE PLAX 2D LVIDd:         3.52 cm  Diastology LVIDs:         1.80 cm  LV e' medial:    7.29 cm/s LV PW:         1.71 cm  LV E/e' medial:  10.9 LV IVS:        1.67 cm  LV e' lateral:   6.64 cm/s LVOT diam:     1.70 cm  LV E/e' lateral: 12.0 LV SV:         58 LV SV Index:   34  LVOT Area:     2.27 cm  RIGHT VENTRICLE RV S prime:     11.40 cm/s TAPSE (M-mode): 1.8 cm LEFT ATRIUM             Index       RIGHT ATRIUM           Index LA diam:        2.90 cm 1.71 cm/m  RA Area:     12.00 cm LA Vol (A2C):   22.0 ml 12.97 ml/m RA Volume:   27.50 ml  16.21 ml/m LA Vol (A4C):   34.1 ml 20.10 ml/m LA Biplane Vol: 27.4 ml 16.15 ml/m  AORTIC VALVE AV Area (Vmax):    2.06 cm AV Area (Vmean):   1.92 cm AV Area (VTI):     2.32 cm AV Vmax:           143.34 cm/s AV Vmean:          107.812 cm/s AV VTI:            0.251 m AV Peak Grad:      8.2 mmHg AV Mean Grad:      5.2 mmHg LVOT Vmax:         130.00 cm/s LVOT Vmean:        91.400 cm/s LVOT VTI:          0.256 m LVOT/AV VTI ratio: 1.02  AORTA Ao Root diam: 3.10 cm MITRAL VALVE MV Area (PHT): 2.85 cm    SHUNTS MV Decel Time: 266 msec    Systemic VTI:  0.26 m MV E velocity: 79.40 cm/s  Systemic Diam: 1.70 cm MV A velocity: 93.20 cm/s MV E/A ratio:  0.85 Carlyle Dolly MD Electronically signed by Carlyle Dolly MD Signature Date/Time: 03/02/2020/4:57:43 PM    Final     Discharge Exam: Vitals:   03/05/20 0505 03/05/20 1003  BP: (!) 188/86   Pulse:    Resp:    Temp:    SpO2:  100%   Vitals:   03/04/20 2118 03/05/20 0450 03/05/20 0505 03/05/20 1003  BP: (!) 153/72 (!) 191/80 (!) 188/86   Pulse: 85 97    Resp: 18 19    Temp: (!) 97.5 F (36.4 C) 98.6 F (37 C)    TempSrc: Oral Oral    SpO2: 96% 100%  100%  Weight:      Height:        General: Pt is alert, awake, not in acute distress Cardiovascular: RRR, S1/S2 +, no rubs, no gallops Respiratory: CTA bilaterally, no wheezing, no rhonchi Abdominal: Soft, NT, ND,  bowel sounds + Extremities: no edema, no cyanosis    The results of significant diagnostics from this hospitalization (including imaging, microbiology, ancillary and laboratory) are listed below for reference.     Microbiology: Recent Results (from the past 240 hour(s))  Respiratory Panel by RT PCR (Flu  A&B, Covid) - Nasopharyngeal Swab     Status: Abnormal   Collection Time: 03/01/20  9:27 PM   Specimen: Nasopharyngeal Swab  Result Value Ref Range Status   SARS Coronavirus 2 by RT PCR POSITIVE (A) NEGATIVE Final    Comment: RESULT CALLED TO, READ BACK BY AND VERIFIED WITH: K WATLINGTON,RN_0  03/02/20 MKELLY (NOTE) SARS-CoV-2 target nucleic acids are DETECTED.  SARS-CoV-2 RNA is generally detectable in upper respiratory specimens  during the acute phase of infection. Positive results are indicative of the presence of the identified virus, but do not rule out bacterial infection or co-infection with other pathogens not detected by the test. Clinical correlation with patient history and other diagnostic information is necessary to determine patient infection status. The expected result is Negative.  Fact Sheet for Patients:  PinkCheek.be  Fact Sheet for Healthcare Providers: GravelBags.it  This test is not yet approved or cleared by the [place] FDA and  has been authorized for detection and/or diagnosis of SARS-CoV-2 by FDA under an Emergency Use Authorization (EUA).  This EUA will remain in effect (meaning this test can b e used) for the duration of  the COVID-19 declaration under Section 564(b)(1) of the Act, 21 U.S.C. section 360bbb-3(b)(1), unless the authorization is terminated or revoked sooner.      Influenza A by PCR NEGATIVE NEGATIVE Final   Influenza B by PCR NEGATIVE NEGATIVE Final    Comment: (NOTE) The Xpert Xpress SARS-CoV-2/FLU/RSV assay is intended as an aid in  the diagnosis of influenza from Nasopharyngeal swab specimens and  should not be used as a sole basis for treatment. Nasal washings and  aspirates are unacceptable for Xpert Xpress SARS-CoV-2/FLU/RSV  testing.  Fact Sheet for Patients: PinkCheek.be  Fact Sheet for Healthcare  Providers: GravelBags.it  This test is not yet approved or cleared by the [place] FDA and  has been authorized for detection and/or diagnosis of SARS-CoV-2 by  FDA under an Emergency Use Authorization (EUA). This EUA will remain  in effect (meaning this test can be used) for the duration of the  Covid-19 declaration under Section 564(b)(1) of the Act, 21  U.S.C. section 360bbb-3(b)(1), unless the authorization is  terminated or revoked. Performed at [facility], [address] [place], [place] [address]   Culture, blood (routine x 2)     Status: None (Preliminary result)   Collection Time: 03/03/20 11:08 AM   Specimen: BLOOD RIGHT HAND  Result Value Ref Range Status   Specimen Description BLOOD RIGHT HAND  Final   Special Requests   Final    BOTTLES DRAWN AEROBIC AND ANAEROBIC Blood Culture adequate volume   Culture   Final    NO GROWTH 2 DAYS Performed at [facility], [address], [place], [place] [address]    Report Status PENDING  Incomplete  Culture, blood (routine x 2)     Status: None (Preliminary result)   Collection Time: 03/03/20 11:10 AM   Specimen: BLOOD LEFT HAND  Result Value Ref Range Status   Specimen Description BLOOD LEFT HAND  Final   Special Requests   Final    BOTTLES DRAWN AEROBIC AND ANAEROBIC Blood Culture adequate volume   Culture   Final  NO GROWTH 2 DAYS Performed at [facility], [address], [place], [place] [address]    Report Status PENDING  Incomplete     Labs: BNP (last 3 results) Recent Labs    03/02/20 0248  BNP 56.4   Basic Metabolic Panel: Recent Labs  Lab 03/01/20 1808 03/03/20 0612 03/04/20 0533 03/05/20 0500  NA 130* 132* 133* 135  K 3.8 3.8 4.1 4.2  CL 97* 101 102 103  CO2 23 21* 22 19*  GLUCOSE 230* 173* 162* 113*  BUN 36* 39* 29* 34*  CREATININE 1.47* 1.37* 1.27* 1.34*  CALCIUM 8.6* 8.1* 8.5* 8.7*  MG 1.6*  --   --   --   PHOS 3.0  --   --   --    Liver Function  Tests: Recent Labs  Lab 03/01/20 1808 03/03/20 0612 03/04/20 0533 03/05/20 0500  AST 36 32 27 27  ALT _0 ALKPHOS 57 49 51 48  BILITOT 0.3 0.4 0.6 0.6  PROT 7.0 6.1* 6.9 6.8  ALBUMIN 3.2* 2.7* 3.0* 2.9*   No results for input(s): LIPASE, AMYLASE in the last 168 hours. No results for input(s): AMMONIA in the last 168 hours. CBC: Recent Labs  Lab 03/01/20 1808 03/03/20 0612 03/04/20 0533 03/05/20 0500  WBC 5.5 4.9 5.3 6.4  NEUTROABS 3.4 2.7 3.3 4.1  HGB 12.0 11.2* 12.5 13.0  HCT 36.9 34.1* 38.6 40.3  MCV 92.3 92.7 92.3 92.4  PLT 181 160 166 184   Cardiac Enzymes: No results for input(s): CKTOTAL, CKMB, CKMBINDEX, TROPONINI in the last 168 hours. BNP: Invalid input(s): POCBNP CBG: Recent Labs  Lab 03/04/20 2116 03/05/20 0108 03/05/20 0458 03/05/20 0916 03/05/20 1119  GLUCAP 108* 91 103* 105* 123*   D-Dimer Recent Labs    03/03/20 0612 03/04/20 0533  DDIMER 0.61* 1.37*   Hgb A1c No results for input(s): HGBA1C in the last 72 hours. Lipid Profile No results for input(s): CHOL, HDL, LDLCALC, TRIG, CHOLHDL, LDLDIRECT in the last 72 hours. Thyroid function studies No results for input(s): TSH, T4TOTAL, T3FREE, THYROIDAB in the last 72 hours.  Invalid input(s): FREET3 Anemia work up Recent Labs    03/04/20 0533 03/05/20 0841  FERRITIN 135 204   Urinalysis    Component Value Date/Time   COLORURINE YELLOW 11/25/2017 [place] 11/25/2017 0747   LABSPEC 1.020 11/25/2017 0747   PHURINE 6.0 11/25/2017 0747   GLUCOSEU >=500 (A) 11/25/2017 0747   HGBUR NEGATIVE 11/25/2017 0747   BILIRUBINUR NEGATIVE 11/25/2017 0747   KETONESUR 5 (A) 11/25/2017 0747   PROTEINUR >=300 (A) 11/25/2017 0747   UROBILINOGEN 0.2 11/08/2014 1915   NITRITE NEGATIVE 11/25/2017 0747   LEUKOCYTESUR NEGATIVE 11/25/2017 0747   Sepsis Labs Invalid input(s): PROCALCITONIN,  WBC,  LACTICIDVEN Microbiology Recent Results (from the past 240 hour(s))   Respiratory Panel by RT PCR (Flu A&B, Covid) - Nasopharyngeal Swab     Status: Abnormal   Collection Time: 03/01/20  9:27 PM   Specimen: Nasopharyngeal Swab  Result Value Ref Range Status   SARS Coronavirus 2 by RT PCR POSITIVE (A) NEGATIVE Final    Comment: RESULT CALLED TO, READ BACK BY AND VERIFIED WITH: K WATLINGTON,RN_1  03/02/20 MKELLY (NOTE) SARS-CoV-2 target nucleic acids are DETECTED.  SARS-CoV-2 RNA is generally detectable in upper respiratory specimens  during the acute phase of infection. Positive results are indicative of the presence of the identified virus, but do not rule out bacterial infection or co-infection with other pathogens  not detected by the test. Clinical correlation with patient history and other diagnostic information is necessary to determine patient infection status. The expected result is Negative.  Fact Sheet for Patients:  PinkCheek.be  Fact Sheet for Healthcare Providers: GravelBags.it  This test is not yet approved or cleared by the [place] FDA and  has been authorized for detection and/or diagnosis of SARS-CoV-2 by FDA under an Emergency Use Authorization (EUA).  This EUA will remain in effect (meaning this test can b e used) for the duration of  the COVID-19 declaration under Section 564(b)(1) of the Act, 21 U.S.C. section 360bbb-3(b)(1), unless the authorization is terminated or revoked sooner.      Influenza A by PCR NEGATIVE NEGATIVE Final   Influenza B by PCR NEGATIVE NEGATIVE Final    Comment: (NOTE) The Xpert Xpress SARS-CoV-2/FLU/RSV assay is intended as an aid in  the diagnosis of influenza from Nasopharyngeal swab specimens and  should not be used as a sole basis for treatment. Nasal washings and  aspirates are unacceptable for Xpert Xpress SARS-CoV-2/FLU/RSV  testing.  Fact Sheet for Patients: PinkCheek.be  Fact Sheet for  Healthcare Providers: GravelBags.it  This test is not yet approved or cleared by the [place] FDA and  has been authorized for detection and/or diagnosis of SARS-CoV-2 by  FDA under an Emergency Use Authorization (EUA). This EUA will remain  in effect (meaning this test can be used) for the duration of the  Covid-19 declaration under Section 564(b)(1) of the Act, 21  U.S.C. section 360bbb-3(b)(1), unless the authorization is  terminated or revoked. Performed at [facility], [address], [place], [place] [address]   Culture, blood (routine x 2)     Status: None (Preliminary result)   Collection Time: 03/03/20 11:08 AM   Specimen: BLOOD RIGHT HAND  Result Value Ref Range Status   Specimen Description BLOOD RIGHT HAND  Final   Special Requests   Final    BOTTLES DRAWN AEROBIC AND ANAEROBIC Blood Culture adequate volume   Culture   Final    NO GROWTH 2 DAYS Performed at [facility], [address], [place], [place] [address]    Report Status PENDING  Incomplete  Culture, blood (routine x 2)     Status: None (Preliminary result)   Collection Time: 03/03/20 11:10 AM   Specimen: BLOOD LEFT HAND  Result Value Ref Range Status   Specimen Description BLOOD LEFT HAND  Final   Special Requests   Final    BOTTLES DRAWN AEROBIC AND ANAEROBIC Blood Culture adequate volume   Culture   Final    NO GROWTH 2 DAYS Performed at [facility], [address] [place], [place] [address]    Report Status PENDING  Incomplete     Time coordinating discharge: 35 minutes  SIGNED:   Rodena Goldmann, DO [facility] 03/05/2020, 2:05 PM  If 7PM-7AM, please contact night-coverage [email or web address]

## 2020-03-04 NOTE — TOC Progression Note (Signed)
Transition of Care [facility]) - Progression Note    Patient Details  Name: [name] MRN: [id number] Date of Birth: 08/15/1955  [facility] CM/SW Contact  Annice Needy, LCSW Phone Number: 03/04/2020, 3:01 PM  Clinical Narrative:    Patient accepts bed at [facility]. Facility started [place].    Expected Discharge Plan: Home/Self Care Barriers to Discharge: Continued Medical Work up  Expected Discharge Plan and Services Expected Discharge Plan: Home/Self Care       Living arrangements for the past 2 months: Single Family Home                                       Social Determinants of Health (SDOH) Interventions    Readmission Risk Interventions No flowsheet data found.

## 2020-03-04 NOTE — Care Management Important Message (Signed)
Important Message  Patient Details  Name: [name] MRN: [id number] Date of Birth: 03-17-1956   Medicare Important Message Given:  Yes Aggie Cosier, RN agreed to deliver letter to patient)     Corey Harold 03/04/2020, 12:09 PM

## 2020-03-04 NOTE — Plan of Care (Signed)
  Problem: Education: Goal: Knowledge of General Education information will improve Description: Including pain rating scale, medication(s)/side effects and non-pharmacologic comfort measures Outcome: Progressing   Problem: Health Behavior/Discharge Planning: Goal: Ability to manage health-related needs will improve Outcome: Progressing   Problem: Clinical Measurements: Goal: Ability to maintain clinical measurements within normal limits will improve Outcome: Progressing Goal: Will remain free from infection Outcome: Progressing Goal: Diagnostic test results will improve Outcome: Progressing Goal: Respiratory complications will improve Outcome: Progressing Goal: Cardiovascular complication will be avoided Outcome: Progressing   Problem: Activity: Goal: Risk for activity intolerance will decrease Outcome: Progressing   Problem: Nutrition: Goal: Adequate nutrition will be maintained Outcome: Progressing   Problem: Coping: Goal: Level of anxiety will decrease Outcome: Progressing   Problem: Elimination: Goal: Will not experience complications related to bowel motility Outcome: Progressing Goal: Will not experience complications related to urinary retention Outcome: Progressing   Problem: Pain Managment: Goal: General experience of comfort will improve Outcome: Progressing   Problem: Safety: Goal: Ability to remain free from injury will improve Outcome: Progressing   Problem: Skin Integrity: Goal: Risk for impaired skin integrity will decrease Outcome: Progressing   Problem: Education: Goal: Knowledge of disease or condition will improve Outcome: Progressing Goal: Knowledge of secondary prevention will improve Outcome: Progressing   Problem: Coping: Goal: Will verbalize positive feelings about self Outcome: Progressing Goal: Will identify appropriate support needs Outcome: Progressing   Problem: Education: Goal: Knowledge of risk factors and measures for  prevention of condition will improve Outcome: Progressing   Problem: Coping: Goal: Psychosocial and spiritual needs will be supported Outcome: Progressing   Problem: Respiratory: Goal: Will maintain a patent airway Outcome: Progressing Goal: Complications related to the disease process, condition or treatment will be avoided or minimized Outcome: Progressing   

## 2020-03-05 DIAGNOSIS — R1312 Dysphagia, oropharyngeal phase: Secondary | ICD-10-CM | POA: Diagnosis not present

## 2020-03-05 DIAGNOSIS — M6281 Muscle weakness (generalized): Secondary | ICD-10-CM | POA: Diagnosis not present

## 2020-03-05 DIAGNOSIS — G8111 Spastic hemiplegia affecting right dominant side: Secondary | ICD-10-CM | POA: Diagnosis not present

## 2020-03-05 DIAGNOSIS — E1165 Type 2 diabetes mellitus with hyperglycemia: Secondary | ICD-10-CM | POA: Diagnosis not present

## 2020-03-05 DIAGNOSIS — R41841 Cognitive communication deficit: Secondary | ICD-10-CM | POA: Diagnosis not present

## 2020-03-05 DIAGNOSIS — E785 Hyperlipidemia, unspecified: Secondary | ICD-10-CM | POA: Diagnosis not present

## 2020-03-05 DIAGNOSIS — R531 Weakness: Secondary | ICD-10-CM | POA: Diagnosis not present

## 2020-03-05 DIAGNOSIS — R7989 Other specified abnormal findings of blood chemistry: Secondary | ICD-10-CM | POA: Diagnosis not present

## 2020-03-05 DIAGNOSIS — E119 Type 2 diabetes mellitus without complications: Secondary | ICD-10-CM | POA: Diagnosis not present

## 2020-03-05 DIAGNOSIS — I69320 Aphasia following cerebral infarction: Secondary | ICD-10-CM | POA: Diagnosis not present

## 2020-03-05 DIAGNOSIS — R1310 Dysphagia, unspecified: Secondary | ICD-10-CM | POA: Diagnosis not present

## 2020-03-05 DIAGNOSIS — E871 Hypo-osmolality and hyponatremia: Secondary | ICD-10-CM | POA: Diagnosis not present

## 2020-03-05 DIAGNOSIS — E559 Vitamin D deficiency, unspecified: Secondary | ICD-10-CM | POA: Diagnosis not present

## 2020-03-05 DIAGNOSIS — Z8616 Personal history of COVID-19: Secondary | ICD-10-CM | POA: Diagnosis not present

## 2020-03-05 DIAGNOSIS — I10 Essential (primary) hypertension: Secondary | ICD-10-CM | POA: Diagnosis not present

## 2020-03-05 DIAGNOSIS — U071 COVID-19: Secondary | ICD-10-CM | POA: Diagnosis not present

## 2020-03-05 DIAGNOSIS — R0902 Hypoxemia: Secondary | ICD-10-CM | POA: Diagnosis not present

## 2020-03-05 LAB — CBC WITH DIFFERENTIAL/PLATELET
Abs Immature Granulocytes: 0.03 10*3/uL (ref 0.00–0.07)
Basophils Absolute: 0 10*3/uL (ref 0.0–0.1)
Basophils Relative: 0 %
Eosinophils Absolute: 0 10*3/uL (ref 0.0–0.5)
Eosinophils Relative: 0 %
HCT: 40.3 % (ref 36.0–46.0)
Hemoglobin: 13 g/dL (ref 12.0–15.0)
Immature Granulocytes: 1 %
Lymphocytes Relative: 28 %
Lymphs Abs: 1.8 10*3/uL (ref 0.7–4.0)
MCH: 29.8 pg (ref 26.0–34.0)
MCHC: 32.3 g/dL (ref 30.0–36.0)
MCV: 92.4 fL (ref 80.0–100.0)
Monocytes Absolute: 0.4 10*3/uL (ref 0.1–1.0)
Monocytes Relative: 7 %
Neutro Abs: 4.1 10*3/uL (ref 1.7–7.7)
Neutrophils Relative %: 64 %
Platelets: 184 10*3/uL (ref 150–400)
RBC: 4.36 MIL/uL (ref 3.87–5.11)
RDW: 13.9 % (ref 11.5–15.5)
WBC: 6.4 10*3/uL (ref 4.0–10.5)
nRBC: 0 % (ref 0.0–0.2)

## 2020-03-05 LAB — COMPREHENSIVE METABOLIC PANEL
ALT: 18 U/L (ref 0–44)
AST: 27 U/L (ref 15–41)
Albumin: 2.9 g/dL — ABNORMAL LOW (ref 3.5–5.0)
Alkaline Phosphatase: 48 U/L (ref 38–126)
Anion gap: 13 (ref 5–15)
BUN: 34 mg/dL — ABNORMAL HIGH (ref 8–23)
CO2: 19 mmol/L — ABNORMAL LOW (ref 22–32)
Calcium: 8.7 mg/dL — ABNORMAL LOW (ref 8.9–10.3)
Chloride: 103 mmol/L (ref 98–111)
Creatinine, Ser: 1.34 mg/dL — ABNORMAL HIGH (ref 0.44–1.00)
GFR, Estimated: 44 mL/min — ABNORMAL LOW (ref >60)
Glucose, Bld: 113 mg/dL — ABNORMAL HIGH (ref 70–99)
Potassium: 4.2 mmol/L (ref 3.5–5.1)
Sodium: 135 mmol/L (ref 135–145)
Total Bilirubin: 0.6 mg/dL (ref 0.3–1.2)
Total Protein: 6.8 g/dL (ref 6.5–8.1)

## 2020-03-05 LAB — GLUCOSE, CAPILLARY
Glucose-Capillary: 91 mg/dL (ref 70–99)
Glucose-Capillary: 103 mg/dL — ABNORMAL HIGH (ref 70–99)
Glucose-Capillary: 105 mg/dL — ABNORMAL HIGH (ref 70–99)
Glucose-Capillary: 123 mg/dL — ABNORMAL HIGH (ref 70–99)

## 2020-03-05 LAB — FERRITIN: Ferritin: 204 ng/mL (ref 11–307)

## 2020-03-05 LAB — C-REACTIVE PROTEIN: CRP: 1.9 mg/dL — ABNORMAL HIGH (ref <1.0)

## 2020-03-05 MED ORDER — METOPROLOL TARTRATE 5 MG/5ML IV SOLN
5.0000 mg | Freq: Once | INTRAVENOUS | Status: AC
  Administered 2020-03-05: 5 mg via INTRAVENOUS
  Filled 2020-03-05: qty 5

## 2020-03-05 MED ORDER — HYDRALAZINE HCL 20 MG/ML IJ SOLN
10.0000 mg | Freq: Once | INTRAMUSCULAR | Status: AC
  Administered 2020-03-05: 10 mg via INTRAVENOUS
  Filled 2020-03-05: qty 1

## 2020-03-05 NOTE — Progress Notes (Signed)
Patient stated she felt like it was hard to catch her breath. Assessed O2 level, pt sat at 100% on RA, HR 109. Will continue to monitor

## 2020-03-05 NOTE — Progress Notes (Addendum)
MD notified of pt reported pain to lower left leg. Pt unable to scale pain. Facial grimacing and moaning observed. MD stated to give scheduled med early, see mar

## 2020-03-05 NOTE — Progress Notes (Addendum)
MD notified of current bp 191/80. MD advised manual bp.188/86 reported to MD, awaiting new orders

## 2020-03-05 NOTE — Progress Notes (Signed)
Patient seen and evaluated this morning with no acute events or concerns noted overnight.  Blood pressures are beginning to elevate some as she has remained off of her home medications, but these will be resumed on discharge.  Please refer to discharge summary dictated 03/04/2020 for full details.  Total CARE time: 20 minutes.

## 2020-03-05 NOTE — Plan of Care (Signed)
  Problem: Education: Goal: Knowledge of General Education information will improve Description: Including pain rating scale, medication(s)/side effects and non-pharmacologic comfort measures Outcome: Progressing   Problem: Health Behavior/Discharge Planning: Goal: Ability to manage health-related needs will improve Outcome: Progressing   Problem: Clinical Measurements: Goal: Ability to maintain clinical measurements within normal limits will improve Outcome: Progressing Goal: Will remain free from infection Outcome: Progressing Goal: Diagnostic test results will improve Outcome: Progressing Goal: Respiratory complications will improve Outcome: Progressing Goal: Cardiovascular complication will be avoided Outcome: Progressing   Problem: Activity: Goal: Risk for activity intolerance will decrease Outcome: Progressing   Problem: Nutrition: Goal: Adequate nutrition will be maintained Outcome: Progressing   Problem: Coping: Goal: Level of anxiety will decrease Outcome: Progressing   Problem: Elimination: Goal: Will not experience complications related to bowel motility Outcome: Progressing Goal: Will not experience complications related to urinary retention Outcome: Progressing   Problem: Pain Managment: Goal: General experience of comfort will improve Outcome: Progressing   Problem: Safety: Goal: Ability to remain free from injury will improve Outcome: Progressing   Problem: Skin Integrity: Goal: Risk for impaired skin integrity will decrease Outcome: Progressing   Problem: Education: Goal: Knowledge of disease or condition will improve Outcome: Progressing Goal: Knowledge of secondary prevention will improve Outcome: Progressing   Problem: Coping: Goal: Will verbalize positive feelings about self Outcome: Progressing Goal: Will identify appropriate support needs Outcome: Progressing   Problem: Education: Goal: Knowledge of risk factors and measures for  prevention of condition will improve Outcome: Progressing   Problem: Coping: Goal: Psychosocial and spiritual needs will be supported Outcome: Progressing   Problem: Respiratory: Goal: Will maintain a patent airway Outcome: Progressing Goal: Complications related to the disease process, condition or treatment will be avoided or minimized Outcome: Progressing

## 2020-03-05 NOTE — Care Management (Addendum)
EMS transport arranged.   Husband updated.

## 2020-03-05 NOTE — Progress Notes (Signed)
Discharge instructions given to nurse Kara Pacer of [facility]. Answered all questions at this time. All belongings sent with patient.

## 2020-03-05 NOTE — Plan of Care (Signed)
Problem: Education: Goal: Knowledge of General Education information will improve Description: Including pain rating scale, medication(s)/side effects and non-pharmacologic comfort measures 03/05/2020 1528 by Cameron Ali, RN Outcome: Completed/Met 03/05/2020 1123 by Cameron Ali, RN Outcome: Progressing   Problem: Health Behavior/Discharge Planning: Goal: Ability to manage health-related needs will improve 03/05/2020 1528 by Cameron Ali, RN Outcome: Completed/Met 03/05/2020 1123 by Cameron Ali, RN Outcome: Progressing   Problem: Clinical Measurements: Goal: Ability to maintain clinical measurements within normal limits will improve 03/05/2020 1528 by Cameron Ali, RN Outcome: Completed/Met 03/05/2020 1123 by Cameron Ali, RN Outcome: Progressing Goal: Will remain free from infection 03/05/2020 1528 by Cameron Ali, RN Outcome: Completed/Met 03/05/2020 1123 by Cameron Ali, RN Outcome: Progressing Goal: Diagnostic test results will improve 03/05/2020 1528 by Cameron Ali, RN Outcome: Completed/Met 03/05/2020 1123 by Cameron Ali, RN Outcome: Progressing Goal: Respiratory complications will improve 03/05/2020 1528 by Cameron Ali, RN Outcome: Completed/Met 03/05/2020 1123 by Cameron Ali, RN Outcome: Progressing Goal: Cardiovascular complication will be avoided 03/05/2020 1528 by Cameron Ali, RN Outcome: Completed/Met 03/05/2020 1123 by Cameron Ali, RN Outcome: Progressing   Problem: Activity: Goal: Risk for activity intolerance will decrease 03/05/2020 1528 by Cameron Ali, RN Outcome: Completed/Met 03/05/2020 1123 by Cameron Ali, RN Outcome: Progressing   Problem: Nutrition: Goal: Adequate nutrition will be maintained 03/05/2020 1528 by Cameron Ali, RN Outcome: Completed/Met 03/05/2020 1123 by Cameron Ali, RN Outcome: Progressing   Problem: Coping: Goal: Level of anxiety will  decrease 03/05/2020 1528 by Cameron Ali, RN Outcome: Completed/Met 03/05/2020 1123 by Cameron Ali, RN Outcome: Progressing   Problem: Elimination: Goal: Will not experience complications related to bowel motility 03/05/2020 1528 by Cameron Ali, RN Outcome: Completed/Met 03/05/2020 1123 by Cameron Ali, RN Outcome: Progressing Goal: Will not experience complications related to urinary retention 03/05/2020 1528 by Cameron Ali, RN Outcome: Completed/Met 03/05/2020 1123 by Cameron Ali, RN Outcome: Progressing   Problem: Pain Managment: Goal: General experience of comfort will improve 03/05/2020 1528 by Cameron Ali, RN Outcome: Completed/Met 03/05/2020 1123 by Cameron Ali, RN Outcome: Progressing   Problem: Safety: Goal: Ability to remain free from injury will improve 03/05/2020 1528 by Cameron Ali, RN Outcome: Completed/Met 03/05/2020 1123 by Cameron Ali, RN Outcome: Progressing   Problem: Skin Integrity: Goal: Risk for impaired skin integrity will decrease 03/05/2020 1528 by Cameron Ali, RN Outcome: Completed/Met 03/05/2020 1123 by Cameron Ali, RN Outcome: Progressing   Problem: Education: Goal: Knowledge of disease or condition will improve 03/05/2020 1528 by Cameron Ali, RN Outcome: Completed/Met 03/05/2020 1123 by Cameron Ali, RN Outcome: Progressing Goal: Knowledge of secondary prevention will improve 03/05/2020 1528 by Cameron Ali, RN Outcome: Completed/Met 03/05/2020 1123 by Cameron Ali, RN Outcome: Progressing   Problem: Coping: Goal: Will verbalize positive feelings about self 03/05/2020 1528 by Cameron Ali, RN Outcome: Completed/Met 03/05/2020 1123 by Cameron Ali, RN Outcome: Progressing Goal: Will identify appropriate support needs 03/05/2020 1528 by Cameron Ali, RN Outcome: Completed/Met 03/05/2020 1123 by Cameron Ali, RN Outcome: Progressing    Problem: Education: Goal: Knowledge of risk factors and measures for prevention of condition will improve 03/05/2020 1528 by Cameron Ali, RN Outcome: Completed/Met 03/05/2020 1123 by Cameron Ali, RN Outcome: Progressing   Problem: Coping: Goal: Psychosocial and spiritual needs will be supported 03/05/2020 1528 by Cameron Ali, RN Outcome:  Completed/Met 03/05/2020 1123 by Cameron Ali, RN Outcome: Progressing   Problem: Respiratory: Goal: Will maintain a patent airway 03/05/2020 1528 by Cameron Ali, RN Outcome: Completed/Met 03/05/2020 1123 by Cameron Ali, RN Outcome: Progressing Goal: Complications related to the disease process, condition or treatment will be avoided or minimized 03/05/2020 1528 by Cameron Ali, RN Outcome: Completed/Met 03/05/2020 1123 by Cameron Ali, RN Outcome: Progressing

## 2020-03-08 LAB — CULTURE, BLOOD (ROUTINE X 2)
Culture: NO GROWTH
Culture: NO GROWTH
Special Requests: ADEQUATE
Special Requests: ADEQUATE

## 2020-03-10 DIAGNOSIS — R7989 Other specified abnormal findings of blood chemistry: Secondary | ICD-10-CM | POA: Diagnosis not present

## 2020-03-10 DIAGNOSIS — E871 Hypo-osmolality and hyponatremia: Secondary | ICD-10-CM | POA: Diagnosis not present

## 2020-03-10 DIAGNOSIS — E785 Hyperlipidemia, unspecified: Secondary | ICD-10-CM | POA: Diagnosis not present

## 2020-03-10 DIAGNOSIS — E559 Vitamin D deficiency, unspecified: Secondary | ICD-10-CM | POA: Diagnosis not present

## 2020-03-10 DIAGNOSIS — E119 Type 2 diabetes mellitus without complications: Secondary | ICD-10-CM | POA: Diagnosis not present

## 2020-03-10 DIAGNOSIS — R531 Weakness: Secondary | ICD-10-CM | POA: Diagnosis not present

## 2020-03-10 DIAGNOSIS — I10 Essential (primary) hypertension: Secondary | ICD-10-CM | POA: Diagnosis not present

## 2020-03-11 DIAGNOSIS — E785 Hyperlipidemia, unspecified: Secondary | ICD-10-CM | POA: Diagnosis not present

## 2020-03-11 DIAGNOSIS — E119 Type 2 diabetes mellitus without complications: Secondary | ICD-10-CM | POA: Diagnosis not present

## 2020-03-11 DIAGNOSIS — R1312 Dysphagia, oropharyngeal phase: Secondary | ICD-10-CM | POA: Diagnosis not present

## 2020-03-11 DIAGNOSIS — M6281 Muscle weakness (generalized): Secondary | ICD-10-CM | POA: Diagnosis not present

## 2020-03-17 DIAGNOSIS — E785 Hyperlipidemia, unspecified: Secondary | ICD-10-CM | POA: Diagnosis not present

## 2020-03-17 DIAGNOSIS — E119 Type 2 diabetes mellitus without complications: Secondary | ICD-10-CM | POA: Diagnosis not present

## 2020-03-17 DIAGNOSIS — I10 Essential (primary) hypertension: Secondary | ICD-10-CM | POA: Diagnosis not present

## 2020-03-19 DIAGNOSIS — I10 Essential (primary) hypertension: Secondary | ICD-10-CM | POA: Diagnosis not present

## 2020-03-19 DIAGNOSIS — R531 Weakness: Secondary | ICD-10-CM | POA: Diagnosis not present

## 2020-03-19 DIAGNOSIS — R41841 Cognitive communication deficit: Secondary | ICD-10-CM | POA: Diagnosis not present

## 2020-03-19 DIAGNOSIS — E785 Hyperlipidemia, unspecified: Secondary | ICD-10-CM | POA: Diagnosis not present

## 2020-03-19 DIAGNOSIS — E119 Type 2 diabetes mellitus without complications: Secondary | ICD-10-CM | POA: Diagnosis not present

## 2020-03-19 DIAGNOSIS — R1310 Dysphagia, unspecified: Secondary | ICD-10-CM | POA: Diagnosis not present

## 2020-03-19 DIAGNOSIS — M6281 Muscle weakness (generalized): Secondary | ICD-10-CM | POA: Diagnosis not present

## 2020-03-20 DIAGNOSIS — R1310 Dysphagia, unspecified: Secondary | ICD-10-CM | POA: Diagnosis not present

## 2020-03-20 DIAGNOSIS — R41841 Cognitive communication deficit: Secondary | ICD-10-CM | POA: Diagnosis not present

## 2020-03-20 DIAGNOSIS — M6281 Muscle weakness (generalized): Secondary | ICD-10-CM | POA: Diagnosis not present

## 2020-03-20 DIAGNOSIS — I10 Essential (primary) hypertension: Secondary | ICD-10-CM | POA: Diagnosis not present

## 2020-03-22 DIAGNOSIS — I10 Essential (primary) hypertension: Secondary | ICD-10-CM | POA: Diagnosis not present

## 2020-03-22 DIAGNOSIS — M6281 Muscle weakness (generalized): Secondary | ICD-10-CM | POA: Diagnosis not present

## 2020-03-22 DIAGNOSIS — Z794 Long term (current) use of insulin: Secondary | ICD-10-CM | POA: Diagnosis not present

## 2020-03-22 DIAGNOSIS — E785 Hyperlipidemia, unspecified: Secondary | ICD-10-CM | POA: Diagnosis not present

## 2020-03-22 DIAGNOSIS — G8111 Spastic hemiplegia affecting right dominant side: Secondary | ICD-10-CM | POA: Diagnosis not present

## 2020-03-22 DIAGNOSIS — E1169 Type 2 diabetes mellitus with other specified complication: Secondary | ICD-10-CM | POA: Diagnosis not present

## 2020-03-22 DIAGNOSIS — R41841 Cognitive communication deficit: Secondary | ICD-10-CM | POA: Diagnosis not present

## 2020-03-22 DIAGNOSIS — R1310 Dysphagia, unspecified: Secondary | ICD-10-CM | POA: Diagnosis not present

## 2020-03-23 DIAGNOSIS — M47816 Spondylosis without myelopathy or radiculopathy, lumbar region: Secondary | ICD-10-CM | POA: Diagnosis not present

## 2020-03-23 DIAGNOSIS — G8911 Acute pain due to trauma: Secondary | ICD-10-CM | POA: Diagnosis not present

## 2020-03-23 DIAGNOSIS — U071 COVID-19: Secondary | ICD-10-CM | POA: Diagnosis not present

## 2020-03-23 DIAGNOSIS — R627 Adult failure to thrive: Secondary | ICD-10-CM | POA: Diagnosis not present

## 2020-03-24 DIAGNOSIS — R1310 Dysphagia, unspecified: Secondary | ICD-10-CM | POA: Diagnosis not present

## 2020-03-24 DIAGNOSIS — R41841 Cognitive communication deficit: Secondary | ICD-10-CM | POA: Diagnosis not present

## 2020-03-24 DIAGNOSIS — I10 Essential (primary) hypertension: Secondary | ICD-10-CM | POA: Diagnosis not present

## 2020-03-24 DIAGNOSIS — M6281 Muscle weakness (generalized): Secondary | ICD-10-CM | POA: Diagnosis not present

## 2020-03-29 DIAGNOSIS — E1169 Type 2 diabetes mellitus with other specified complication: Secondary | ICD-10-CM | POA: Diagnosis not present

## 2020-03-29 DIAGNOSIS — I10 Essential (primary) hypertension: Secondary | ICD-10-CM | POA: Diagnosis not present

## 2020-03-29 DIAGNOSIS — R627 Adult failure to thrive: Secondary | ICD-10-CM | POA: Diagnosis not present

## 2020-03-29 DIAGNOSIS — R41841 Cognitive communication deficit: Secondary | ICD-10-CM | POA: Diagnosis not present

## 2020-03-29 DIAGNOSIS — G8111 Spastic hemiplegia affecting right dominant side: Secondary | ICD-10-CM | POA: Diagnosis not present

## 2020-03-29 DIAGNOSIS — M6281 Muscle weakness (generalized): Secondary | ICD-10-CM | POA: Diagnosis not present

## 2020-03-29 DIAGNOSIS — R630 Anorexia: Secondary | ICD-10-CM | POA: Diagnosis not present

## 2020-03-29 DIAGNOSIS — R1310 Dysphagia, unspecified: Secondary | ICD-10-CM | POA: Diagnosis not present

## 2020-03-29 DIAGNOSIS — E785 Hyperlipidemia, unspecified: Secondary | ICD-10-CM | POA: Diagnosis not present

## 2020-03-31 DIAGNOSIS — I10 Essential (primary) hypertension: Secondary | ICD-10-CM | POA: Diagnosis not present

## 2020-03-31 DIAGNOSIS — R1310 Dysphagia, unspecified: Secondary | ICD-10-CM | POA: Diagnosis not present

## 2020-03-31 DIAGNOSIS — M6281 Muscle weakness (generalized): Secondary | ICD-10-CM | POA: Diagnosis not present

## 2020-03-31 DIAGNOSIS — R41841 Cognitive communication deficit: Secondary | ICD-10-CM | POA: Diagnosis not present

## 2020-04-01 DIAGNOSIS — E785 Hyperlipidemia, unspecified: Secondary | ICD-10-CM | POA: Diagnosis not present

## 2020-04-01 DIAGNOSIS — G8111 Spastic hemiplegia affecting right dominant side: Secondary | ICD-10-CM | POA: Diagnosis not present

## 2020-04-01 DIAGNOSIS — E1169 Type 2 diabetes mellitus with other specified complication: Secondary | ICD-10-CM | POA: Diagnosis not present

## 2020-04-01 DIAGNOSIS — R1310 Dysphagia, unspecified: Secondary | ICD-10-CM | POA: Diagnosis not present

## 2020-04-01 DIAGNOSIS — R630 Anorexia: Secondary | ICD-10-CM | POA: Diagnosis not present

## 2020-04-01 DIAGNOSIS — M6281 Muscle weakness (generalized): Secondary | ICD-10-CM | POA: Diagnosis not present

## 2020-04-01 DIAGNOSIS — I10 Essential (primary) hypertension: Secondary | ICD-10-CM | POA: Diagnosis not present

## 2020-04-01 DIAGNOSIS — Z794 Long term (current) use of insulin: Secondary | ICD-10-CM | POA: Diagnosis not present

## 2020-04-01 DIAGNOSIS — R627 Adult failure to thrive: Secondary | ICD-10-CM | POA: Diagnosis not present

## 2020-04-01 DIAGNOSIS — Z79899 Other long term (current) drug therapy: Secondary | ICD-10-CM | POA: Diagnosis not present

## 2020-04-01 DIAGNOSIS — R41841 Cognitive communication deficit: Secondary | ICD-10-CM | POA: Diagnosis not present

## 2020-04-02 DIAGNOSIS — E119 Type 2 diabetes mellitus without complications: Secondary | ICD-10-CM | POA: Diagnosis not present

## 2020-04-06 DIAGNOSIS — I10 Essential (primary) hypertension: Secondary | ICD-10-CM | POA: Diagnosis not present

## 2020-04-06 DIAGNOSIS — R41841 Cognitive communication deficit: Secondary | ICD-10-CM | POA: Diagnosis not present

## 2020-04-06 DIAGNOSIS — R1310 Dysphagia, unspecified: Secondary | ICD-10-CM | POA: Diagnosis not present

## 2020-04-06 DIAGNOSIS — M6281 Muscle weakness (generalized): Secondary | ICD-10-CM | POA: Diagnosis not present

## 2020-04-07 DIAGNOSIS — M6281 Muscle weakness (generalized): Secondary | ICD-10-CM | POA: Diagnosis not present

## 2020-04-07 DIAGNOSIS — R1310 Dysphagia, unspecified: Secondary | ICD-10-CM | POA: Diagnosis not present

## 2020-04-07 DIAGNOSIS — I10 Essential (primary) hypertension: Secondary | ICD-10-CM | POA: Diagnosis not present

## 2020-04-07 DIAGNOSIS — R41841 Cognitive communication deficit: Secondary | ICD-10-CM | POA: Diagnosis not present

## 2020-04-08 DIAGNOSIS — M6281 Muscle weakness (generalized): Secondary | ICD-10-CM | POA: Diagnosis not present

## 2020-04-08 DIAGNOSIS — R1310 Dysphagia, unspecified: Secondary | ICD-10-CM | POA: Diagnosis not present

## 2020-04-08 DIAGNOSIS — R41841 Cognitive communication deficit: Secondary | ICD-10-CM | POA: Diagnosis not present

## 2020-04-08 DIAGNOSIS — I10 Essential (primary) hypertension: Secondary | ICD-10-CM | POA: Diagnosis not present

## 2020-04-09 DIAGNOSIS — M6281 Muscle weakness (generalized): Secondary | ICD-10-CM | POA: Diagnosis not present

## 2020-04-09 DIAGNOSIS — Z711 Person with feared health complaint in whom no diagnosis is made: Secondary | ICD-10-CM | POA: Diagnosis not present

## 2020-04-09 DIAGNOSIS — R1310 Dysphagia, unspecified: Secondary | ICD-10-CM | POA: Diagnosis not present

## 2020-04-09 DIAGNOSIS — R41841 Cognitive communication deficit: Secondary | ICD-10-CM | POA: Diagnosis not present

## 2020-04-09 DIAGNOSIS — I10 Essential (primary) hypertension: Secondary | ICD-10-CM | POA: Diagnosis not present

## 2020-04-12 DIAGNOSIS — R1310 Dysphagia, unspecified: Secondary | ICD-10-CM | POA: Diagnosis not present

## 2020-04-12 DIAGNOSIS — M6281 Muscle weakness (generalized): Secondary | ICD-10-CM | POA: Diagnosis not present

## 2020-04-12 DIAGNOSIS — G8111 Spastic hemiplegia affecting right dominant side: Secondary | ICD-10-CM | POA: Diagnosis not present

## 2020-04-12 DIAGNOSIS — R627 Adult failure to thrive: Secondary | ICD-10-CM | POA: Diagnosis not present

## 2020-04-12 DIAGNOSIS — R41841 Cognitive communication deficit: Secondary | ICD-10-CM | POA: Diagnosis not present

## 2020-04-12 DIAGNOSIS — I10 Essential (primary) hypertension: Secondary | ICD-10-CM | POA: Diagnosis not present

## 2020-04-12 DIAGNOSIS — E785 Hyperlipidemia, unspecified: Secondary | ICD-10-CM | POA: Diagnosis not present

## 2020-04-12 DIAGNOSIS — E1169 Type 2 diabetes mellitus with other specified complication: Secondary | ICD-10-CM | POA: Diagnosis not present

## 2020-04-12 DIAGNOSIS — Z794 Long term (current) use of insulin: Secondary | ICD-10-CM | POA: Diagnosis not present

## 2020-04-13 DIAGNOSIS — M6281 Muscle weakness (generalized): Secondary | ICD-10-CM | POA: Diagnosis not present

## 2020-04-13 DIAGNOSIS — R41841 Cognitive communication deficit: Secondary | ICD-10-CM | POA: Diagnosis not present

## 2020-04-13 DIAGNOSIS — R1310 Dysphagia, unspecified: Secondary | ICD-10-CM | POA: Diagnosis not present

## 2020-04-13 DIAGNOSIS — I10 Essential (primary) hypertension: Secondary | ICD-10-CM | POA: Diagnosis not present

## 2020-04-15 DIAGNOSIS — I69351 Hemiplegia and hemiparesis following cerebral infarction affecting right dominant side: Secondary | ICD-10-CM | POA: Diagnosis not present

## 2020-04-15 DIAGNOSIS — R627 Adult failure to thrive: Secondary | ICD-10-CM | POA: Diagnosis not present

## 2020-04-15 DIAGNOSIS — M797 Fibromyalgia: Secondary | ICD-10-CM | POA: Diagnosis not present

## 2020-04-15 DIAGNOSIS — G8929 Other chronic pain: Secondary | ICD-10-CM | POA: Diagnosis not present

## 2020-04-15 DIAGNOSIS — I10 Essential (primary) hypertension: Secondary | ICD-10-CM | POA: Diagnosis not present

## 2020-04-15 DIAGNOSIS — Z87891 Personal history of nicotine dependence: Secondary | ICD-10-CM | POA: Diagnosis not present

## 2020-04-15 DIAGNOSIS — K5792 Diverticulitis of intestine, part unspecified, without perforation or abscess without bleeding: Secondary | ICD-10-CM | POA: Diagnosis not present

## 2020-04-15 DIAGNOSIS — R630 Anorexia: Secondary | ICD-10-CM | POA: Diagnosis not present

## 2020-04-15 DIAGNOSIS — E1165 Type 2 diabetes mellitus with hyperglycemia: Secondary | ICD-10-CM | POA: Diagnosis not present

## 2020-04-15 DIAGNOSIS — E785 Hyperlipidemia, unspecified: Secondary | ICD-10-CM | POA: Diagnosis not present

## 2020-04-15 DIAGNOSIS — Z8616 Personal history of COVID-19: Secondary | ICD-10-CM | POA: Diagnosis not present

## 2020-04-19 DIAGNOSIS — Z8616 Personal history of COVID-19: Secondary | ICD-10-CM | POA: Diagnosis not present

## 2020-04-19 DIAGNOSIS — I10 Essential (primary) hypertension: Secondary | ICD-10-CM | POA: Diagnosis not present

## 2020-04-19 DIAGNOSIS — K5792 Diverticulitis of intestine, part unspecified, without perforation or abscess without bleeding: Secondary | ICD-10-CM | POA: Diagnosis not present

## 2020-04-19 DIAGNOSIS — E1165 Type 2 diabetes mellitus with hyperglycemia: Secondary | ICD-10-CM | POA: Diagnosis not present

## 2020-04-19 DIAGNOSIS — G8929 Other chronic pain: Secondary | ICD-10-CM | POA: Diagnosis not present

## 2020-04-19 DIAGNOSIS — R627 Adult failure to thrive: Secondary | ICD-10-CM | POA: Diagnosis not present

## 2020-04-19 DIAGNOSIS — E785 Hyperlipidemia, unspecified: Secondary | ICD-10-CM | POA: Diagnosis not present

## 2020-04-19 DIAGNOSIS — R630 Anorexia: Secondary | ICD-10-CM | POA: Diagnosis not present

## 2020-04-19 DIAGNOSIS — I69351 Hemiplegia and hemiparesis following cerebral infarction affecting right dominant side: Secondary | ICD-10-CM | POA: Diagnosis not present

## 2020-04-19 DIAGNOSIS — M797 Fibromyalgia: Secondary | ICD-10-CM | POA: Diagnosis not present

## 2020-04-21 DIAGNOSIS — R630 Anorexia: Secondary | ICD-10-CM | POA: Diagnosis not present

## 2020-04-21 DIAGNOSIS — I10 Essential (primary) hypertension: Secondary | ICD-10-CM | POA: Diagnosis not present

## 2020-04-21 DIAGNOSIS — K5792 Diverticulitis of intestine, part unspecified, without perforation or abscess without bleeding: Secondary | ICD-10-CM | POA: Diagnosis not present

## 2020-04-21 DIAGNOSIS — M797 Fibromyalgia: Secondary | ICD-10-CM | POA: Diagnosis not present

## 2020-04-21 DIAGNOSIS — E785 Hyperlipidemia, unspecified: Secondary | ICD-10-CM | POA: Diagnosis not present

## 2020-04-21 DIAGNOSIS — E1165 Type 2 diabetes mellitus with hyperglycemia: Secondary | ICD-10-CM | POA: Diagnosis not present

## 2020-04-21 DIAGNOSIS — Z8616 Personal history of COVID-19: Secondary | ICD-10-CM | POA: Diagnosis not present

## 2020-04-21 DIAGNOSIS — G8929 Other chronic pain: Secondary | ICD-10-CM | POA: Diagnosis not present

## 2020-04-21 DIAGNOSIS — I69351 Hemiplegia and hemiparesis following cerebral infarction affecting right dominant side: Secondary | ICD-10-CM | POA: Diagnosis not present

## 2020-04-21 DIAGNOSIS — R627 Adult failure to thrive: Secondary | ICD-10-CM | POA: Diagnosis not present

## 2020-04-22 DIAGNOSIS — E1165 Type 2 diabetes mellitus with hyperglycemia: Secondary | ICD-10-CM | POA: Diagnosis not present

## 2020-04-22 DIAGNOSIS — G8929 Other chronic pain: Secondary | ICD-10-CM | POA: Diagnosis not present

## 2020-04-22 DIAGNOSIS — R627 Adult failure to thrive: Secondary | ICD-10-CM | POA: Diagnosis not present

## 2020-04-22 DIAGNOSIS — K5792 Diverticulitis of intestine, part unspecified, without perforation or abscess without bleeding: Secondary | ICD-10-CM | POA: Diagnosis not present

## 2020-04-22 DIAGNOSIS — Z8616 Personal history of COVID-19: Secondary | ICD-10-CM | POA: Diagnosis not present

## 2020-04-22 DIAGNOSIS — I10 Essential (primary) hypertension: Secondary | ICD-10-CM | POA: Diagnosis not present

## 2020-04-22 DIAGNOSIS — E785 Hyperlipidemia, unspecified: Secondary | ICD-10-CM | POA: Diagnosis not present

## 2020-04-22 DIAGNOSIS — R630 Anorexia: Secondary | ICD-10-CM | POA: Diagnosis not present

## 2020-04-22 DIAGNOSIS — M797 Fibromyalgia: Secondary | ICD-10-CM | POA: Diagnosis not present

## 2020-04-22 DIAGNOSIS — I69351 Hemiplegia and hemiparesis following cerebral infarction affecting right dominant side: Secondary | ICD-10-CM | POA: Diagnosis not present

## 2020-04-26 DIAGNOSIS — E785 Hyperlipidemia, unspecified: Secondary | ICD-10-CM | POA: Diagnosis not present

## 2020-04-26 DIAGNOSIS — M797 Fibromyalgia: Secondary | ICD-10-CM | POA: Diagnosis not present

## 2020-04-26 DIAGNOSIS — K5792 Diverticulitis of intestine, part unspecified, without perforation or abscess without bleeding: Secondary | ICD-10-CM | POA: Diagnosis not present

## 2020-04-26 DIAGNOSIS — I10 Essential (primary) hypertension: Secondary | ICD-10-CM | POA: Diagnosis not present

## 2020-04-26 DIAGNOSIS — G8929 Other chronic pain: Secondary | ICD-10-CM | POA: Diagnosis not present

## 2020-04-26 DIAGNOSIS — Z8616 Personal history of COVID-19: Secondary | ICD-10-CM | POA: Diagnosis not present

## 2020-04-26 DIAGNOSIS — I69351 Hemiplegia and hemiparesis following cerebral infarction affecting right dominant side: Secondary | ICD-10-CM | POA: Diagnosis not present

## 2020-04-26 DIAGNOSIS — E1165 Type 2 diabetes mellitus with hyperglycemia: Secondary | ICD-10-CM | POA: Diagnosis not present

## 2020-04-26 DIAGNOSIS — R630 Anorexia: Secondary | ICD-10-CM | POA: Diagnosis not present

## 2020-04-26 DIAGNOSIS — R627 Adult failure to thrive: Secondary | ICD-10-CM | POA: Diagnosis not present

## 2020-04-28 DIAGNOSIS — M797 Fibromyalgia: Secondary | ICD-10-CM | POA: Diagnosis not present

## 2020-04-28 DIAGNOSIS — E785 Hyperlipidemia, unspecified: Secondary | ICD-10-CM | POA: Diagnosis not present

## 2020-04-28 DIAGNOSIS — R627 Adult failure to thrive: Secondary | ICD-10-CM | POA: Diagnosis not present

## 2020-04-28 DIAGNOSIS — Z8616 Personal history of COVID-19: Secondary | ICD-10-CM | POA: Diagnosis not present

## 2020-04-28 DIAGNOSIS — I10 Essential (primary) hypertension: Secondary | ICD-10-CM | POA: Diagnosis not present

## 2020-04-28 DIAGNOSIS — K5792 Diverticulitis of intestine, part unspecified, without perforation or abscess without bleeding: Secondary | ICD-10-CM | POA: Diagnosis not present

## 2020-04-28 DIAGNOSIS — I69351 Hemiplegia and hemiparesis following cerebral infarction affecting right dominant side: Secondary | ICD-10-CM | POA: Diagnosis not present

## 2020-04-28 DIAGNOSIS — G8929 Other chronic pain: Secondary | ICD-10-CM | POA: Diagnosis not present

## 2020-04-28 DIAGNOSIS — E1165 Type 2 diabetes mellitus with hyperglycemia: Secondary | ICD-10-CM | POA: Diagnosis not present

## 2020-04-28 DIAGNOSIS — R630 Anorexia: Secondary | ICD-10-CM | POA: Diagnosis not present

## 2020-04-29 DIAGNOSIS — E039 Hypothyroidism, unspecified: Secondary | ICD-10-CM | POA: Diagnosis not present

## 2020-04-29 DIAGNOSIS — I10 Essential (primary) hypertension: Secondary | ICD-10-CM | POA: Diagnosis not present

## 2020-04-29 DIAGNOSIS — R944 Abnormal results of kidney function studies: Secondary | ICD-10-CM | POA: Diagnosis not present

## 2020-04-29 DIAGNOSIS — J309 Allergic rhinitis, unspecified: Secondary | ICD-10-CM | POA: Diagnosis not present

## 2020-04-29 DIAGNOSIS — I120 Hypertensive chronic kidney disease with stage 5 chronic kidney disease or end stage renal disease: Secondary | ICD-10-CM | POA: Diagnosis not present

## 2020-04-29 DIAGNOSIS — R21 Rash and other nonspecific skin eruption: Secondary | ICD-10-CM | POA: Diagnosis not present

## 2020-04-29 DIAGNOSIS — E782 Mixed hyperlipidemia: Secondary | ICD-10-CM | POA: Diagnosis not present

## 2020-04-29 DIAGNOSIS — M2450 Contracture, unspecified joint: Secondary | ICD-10-CM | POA: Diagnosis not present

## 2020-04-29 DIAGNOSIS — E1165 Type 2 diabetes mellitus with hyperglycemia: Secondary | ICD-10-CM | POA: Diagnosis not present

## 2020-04-29 DIAGNOSIS — N1831 Chronic kidney disease, stage 3a: Secondary | ICD-10-CM | POA: Diagnosis not present

## 2020-04-29 DIAGNOSIS — Z712 Person consulting for explanation of examination or test findings: Secondary | ICD-10-CM | POA: Diagnosis not present

## 2020-07-01 DIAGNOSIS — G5781 Other specified mononeuropathies of right lower limb: Secondary | ICD-10-CM | POA: Diagnosis not present

## 2020-07-01 DIAGNOSIS — E1140 Type 2 diabetes mellitus with diabetic neuropathy, unspecified: Secondary | ICD-10-CM | POA: Diagnosis not present

## 2020-07-01 DIAGNOSIS — I10 Essential (primary) hypertension: Secondary | ICD-10-CM | POA: Diagnosis not present

## 2020-08-19 DIAGNOSIS — I10 Essential (primary) hypertension: Secondary | ICD-10-CM | POA: Diagnosis not present

## 2020-08-19 DIAGNOSIS — E785 Hyperlipidemia, unspecified: Secondary | ICD-10-CM | POA: Diagnosis not present

## 2020-08-19 DIAGNOSIS — E1140 Type 2 diabetes mellitus with diabetic neuropathy, unspecified: Secondary | ICD-10-CM | POA: Diagnosis not present

## 2020-08-19 DIAGNOSIS — E782 Mixed hyperlipidemia: Secondary | ICD-10-CM | POA: Diagnosis not present

## 2020-08-19 DIAGNOSIS — E1169 Type 2 diabetes mellitus with other specified complication: Secondary | ICD-10-CM | POA: Diagnosis not present

## 2020-08-19 DIAGNOSIS — E039 Hypothyroidism, unspecified: Secondary | ICD-10-CM | POA: Diagnosis not present

## 2020-08-19 DIAGNOSIS — R946 Abnormal results of thyroid function studies: Secondary | ICD-10-CM | POA: Diagnosis not present

## 2020-08-19 DIAGNOSIS — I129 Hypertensive chronic kidney disease with stage 1 through stage 4 chronic kidney disease, or unspecified chronic kidney disease: Secondary | ICD-10-CM | POA: Diagnosis not present

## 2020-08-19 DIAGNOSIS — I120 Hypertensive chronic kidney disease with stage 5 chronic kidney disease or end stage renal disease: Secondary | ICD-10-CM | POA: Diagnosis not present

## 2020-08-19 DIAGNOSIS — E1165 Type 2 diabetes mellitus with hyperglycemia: Secondary | ICD-10-CM | POA: Diagnosis not present

## 2020-08-24 DIAGNOSIS — M2450 Contracture, unspecified joint: Secondary | ICD-10-CM | POA: Diagnosis not present

## 2020-08-24 DIAGNOSIS — E1169 Type 2 diabetes mellitus with other specified complication: Secondary | ICD-10-CM | POA: Diagnosis not present

## 2020-08-24 DIAGNOSIS — R69 Illness, unspecified: Secondary | ICD-10-CM | POA: Diagnosis not present

## 2020-08-24 DIAGNOSIS — R944 Abnormal results of kidney function studies: Secondary | ICD-10-CM | POA: Diagnosis not present

## 2020-08-24 DIAGNOSIS — Z8673 Personal history of transient ischemic attack (TIA), and cerebral infarction without residual deficits: Secondary | ICD-10-CM | POA: Diagnosis not present

## 2020-08-24 DIAGNOSIS — G4762 Sleep related leg cramps: Secondary | ICD-10-CM | POA: Diagnosis not present

## 2020-08-24 DIAGNOSIS — G4700 Insomnia, unspecified: Secondary | ICD-10-CM | POA: Diagnosis not present

## 2020-08-24 DIAGNOSIS — I10 Essential (primary) hypertension: Secondary | ICD-10-CM | POA: Diagnosis not present

## 2020-08-24 DIAGNOSIS — E782 Mixed hyperlipidemia: Secondary | ICD-10-CM | POA: Diagnosis not present

## 2020-08-24 DIAGNOSIS — E039 Hypothyroidism, unspecified: Secondary | ICD-10-CM | POA: Diagnosis not present

## 2020-09-14 DIAGNOSIS — E1165 Type 2 diabetes mellitus with hyperglycemia: Secondary | ICD-10-CM | POA: Diagnosis not present

## 2020-09-28 DIAGNOSIS — E1165 Type 2 diabetes mellitus with hyperglycemia: Secondary | ICD-10-CM | POA: Diagnosis not present

## 2020-11-19 DIAGNOSIS — E1165 Type 2 diabetes mellitus with hyperglycemia: Secondary | ICD-10-CM | POA: Diagnosis not present

## 2020-11-19 DIAGNOSIS — M25561 Pain in right knee: Secondary | ICD-10-CM | POA: Diagnosis not present

## 2020-12-30 DIAGNOSIS — E1165 Type 2 diabetes mellitus with hyperglycemia: Secondary | ICD-10-CM | POA: Diagnosis not present

## 2021-01-06 DIAGNOSIS — E782 Mixed hyperlipidemia: Secondary | ICD-10-CM | POA: Diagnosis not present

## 2021-01-06 DIAGNOSIS — M2450 Contracture, unspecified joint: Secondary | ICD-10-CM | POA: Diagnosis not present

## 2021-01-06 DIAGNOSIS — Z23 Encounter for immunization: Secondary | ICD-10-CM | POA: Diagnosis not present

## 2021-01-06 DIAGNOSIS — I10 Essential (primary) hypertension: Secondary | ICD-10-CM | POA: Diagnosis not present

## 2021-01-06 DIAGNOSIS — N1831 Chronic kidney disease, stage 3a: Secondary | ICD-10-CM | POA: Diagnosis not present

## 2021-01-06 DIAGNOSIS — R69 Illness, unspecified: Secondary | ICD-10-CM | POA: Diagnosis not present

## 2021-01-06 DIAGNOSIS — E039 Hypothyroidism, unspecified: Secondary | ICD-10-CM | POA: Diagnosis not present

## 2021-01-06 DIAGNOSIS — E1165 Type 2 diabetes mellitus with hyperglycemia: Secondary | ICD-10-CM | POA: Diagnosis not present

## 2021-01-06 DIAGNOSIS — G8190 Hemiplegia, unspecified affecting unspecified side: Secondary | ICD-10-CM | POA: Diagnosis not present

## 2021-01-06 DIAGNOSIS — I639 Cerebral infarction, unspecified: Secondary | ICD-10-CM | POA: Diagnosis not present

## 2021-02-11 DIAGNOSIS — M25562 Pain in left knee: Secondary | ICD-10-CM | POA: Diagnosis not present

## 2021-02-11 DIAGNOSIS — R600 Localized edema: Secondary | ICD-10-CM | POA: Diagnosis not present

## 2021-04-08 DIAGNOSIS — I10 Essential (primary) hypertension: Secondary | ICD-10-CM | POA: Diagnosis not present

## 2021-04-08 DIAGNOSIS — E1165 Type 2 diabetes mellitus with hyperglycemia: Secondary | ICD-10-CM | POA: Diagnosis not present

## 2021-04-08 DIAGNOSIS — E039 Hypothyroidism, unspecified: Secondary | ICD-10-CM | POA: Diagnosis not present

## 2021-04-11 DIAGNOSIS — E1165 Type 2 diabetes mellitus with hyperglycemia: Secondary | ICD-10-CM | POA: Diagnosis not present

## 2021-04-11 DIAGNOSIS — R252 Cramp and spasm: Secondary | ICD-10-CM | POA: Diagnosis not present

## 2021-04-11 DIAGNOSIS — E039 Hypothyroidism, unspecified: Secondary | ICD-10-CM | POA: Diagnosis not present

## 2021-04-11 DIAGNOSIS — M2450 Contracture, unspecified joint: Secondary | ICD-10-CM | POA: Diagnosis not present

## 2021-04-11 DIAGNOSIS — E782 Mixed hyperlipidemia: Secondary | ICD-10-CM | POA: Diagnosis not present

## 2021-04-11 DIAGNOSIS — M79604 Pain in right leg: Secondary | ICD-10-CM | POA: Diagnosis not present

## 2021-04-11 DIAGNOSIS — I639 Cerebral infarction, unspecified: Secondary | ICD-10-CM | POA: Diagnosis not present

## 2021-04-11 DIAGNOSIS — R69 Illness, unspecified: Secondary | ICD-10-CM | POA: Diagnosis not present

## 2021-04-11 DIAGNOSIS — I10 Essential (primary) hypertension: Secondary | ICD-10-CM | POA: Diagnosis not present

## 2021-04-11 DIAGNOSIS — G8190 Hemiplegia, unspecified affecting unspecified side: Secondary | ICD-10-CM | POA: Diagnosis not present

## 2021-04-11 DIAGNOSIS — N1831 Chronic kidney disease, stage 3a: Secondary | ICD-10-CM | POA: Diagnosis not present

## 2021-05-09 DIAGNOSIS — E876 Hypokalemia: Secondary | ICD-10-CM | POA: Diagnosis not present

## 2021-07-06 DIAGNOSIS — E1165 Type 2 diabetes mellitus with hyperglycemia: Secondary | ICD-10-CM | POA: Diagnosis not present

## 2021-07-06 DIAGNOSIS — I10 Essential (primary) hypertension: Secondary | ICD-10-CM | POA: Diagnosis not present

## 2021-07-06 DIAGNOSIS — E039 Hypothyroidism, unspecified: Secondary | ICD-10-CM | POA: Diagnosis not present

## 2021-07-11 DIAGNOSIS — I639 Cerebral infarction, unspecified: Secondary | ICD-10-CM | POA: Diagnosis not present

## 2021-07-11 DIAGNOSIS — E039 Hypothyroidism, unspecified: Secondary | ICD-10-CM | POA: Diagnosis not present

## 2021-07-11 DIAGNOSIS — E782 Mixed hyperlipidemia: Secondary | ICD-10-CM | POA: Diagnosis not present

## 2021-07-11 DIAGNOSIS — M2450 Contracture, unspecified joint: Secondary | ICD-10-CM | POA: Diagnosis not present

## 2021-07-11 DIAGNOSIS — R69 Illness, unspecified: Secondary | ICD-10-CM | POA: Diagnosis not present

## 2021-07-11 DIAGNOSIS — I10 Essential (primary) hypertension: Secondary | ICD-10-CM | POA: Diagnosis not present

## 2021-07-11 DIAGNOSIS — G8190 Hemiplegia, unspecified affecting unspecified side: Secondary | ICD-10-CM | POA: Diagnosis not present

## 2021-07-11 DIAGNOSIS — E1165 Type 2 diabetes mellitus with hyperglycemia: Secondary | ICD-10-CM | POA: Diagnosis not present

## 2021-07-11 DIAGNOSIS — N1831 Chronic kidney disease, stage 3a: Secondary | ICD-10-CM | POA: Diagnosis not present

## 2021-09-29 DEATH — deceased
